# Patient Record
Sex: Female | Born: 1985 | Race: White | Hispanic: Yes | Marital: Single | Smoking: Never smoker
Health system: Southern US, Community
[De-identification: ages and names within clinical notes are randomized; demographics above are authoritative.]

## PROBLEM LIST (undated history)

## (undated) ENCOUNTER — Inpatient Hospital Stay (HOSPITAL_COMMUNITY): Payer: Self-pay

## (undated) DIAGNOSIS — O24419 Gestational diabetes mellitus in pregnancy, unspecified control: Secondary | ICD-10-CM

## (undated) DIAGNOSIS — K8020 Calculus of gallbladder without cholecystitis without obstruction: Secondary | ICD-10-CM

## (undated) DIAGNOSIS — Z8632 Personal history of gestational diabetes: Secondary | ICD-10-CM

## (undated) DIAGNOSIS — E119 Type 2 diabetes mellitus without complications: Secondary | ICD-10-CM

## (undated) HISTORY — PX: NO PAST SURGERIES: SHX2092

## (undated) HISTORY — DX: Personal history of gestational diabetes: Z86.32

---

## 2007-08-23 ENCOUNTER — Inpatient Hospital Stay (HOSPITAL_COMMUNITY): Admission: AD | Admit: 2007-08-23 | Discharge: 2007-08-24 | Payer: Self-pay | Admitting: Obstetrics & Gynecology

## 2007-08-23 ENCOUNTER — Inpatient Hospital Stay (HOSPITAL_COMMUNITY): Admission: AD | Admit: 2007-08-23 | Discharge: 2007-08-23 | Payer: Self-pay | Admitting: Obstetrics & Gynecology

## 2007-08-26 ENCOUNTER — Inpatient Hospital Stay (HOSPITAL_COMMUNITY): Admission: AD | Admit: 2007-08-26 | Discharge: 2007-08-26 | Payer: Self-pay | Admitting: Obstetrics and Gynecology

## 2008-10-13 ENCOUNTER — Ambulatory Visit (HOSPITAL_COMMUNITY): Admission: RE | Admit: 2008-10-13 | Discharge: 2008-10-13 | Payer: Self-pay | Admitting: Obstetrics & Gynecology

## 2008-10-27 ENCOUNTER — Inpatient Hospital Stay (HOSPITAL_COMMUNITY): Admission: AD | Admit: 2008-10-27 | Discharge: 2008-10-27 | Payer: Self-pay | Admitting: Obstetrics & Gynecology

## 2009-01-31 ENCOUNTER — Inpatient Hospital Stay (HOSPITAL_COMMUNITY): Admission: AD | Admit: 2009-01-31 | Discharge: 2009-02-03 | Payer: Self-pay | Admitting: Obstetrics & Gynecology

## 2009-01-31 ENCOUNTER — Ambulatory Visit: Payer: Self-pay | Admitting: Advanced Practice Midwife

## 2009-01-31 ENCOUNTER — Inpatient Hospital Stay (HOSPITAL_COMMUNITY): Admission: AD | Admit: 2009-01-31 | Discharge: 2009-01-31 | Payer: Self-pay | Admitting: Obstetrics & Gynecology

## 2010-01-21 ENCOUNTER — Inpatient Hospital Stay (HOSPITAL_COMMUNITY): Admission: AD | Admit: 2010-01-21 | Discharge: 2010-01-21 | Payer: Self-pay | Admitting: Obstetrics & Gynecology

## 2010-03-07 ENCOUNTER — Emergency Department (HOSPITAL_COMMUNITY): Admission: EM | Admit: 2010-03-07 | Discharge: 2010-03-08 | Payer: Self-pay | Admitting: Emergency Medicine

## 2010-12-02 LAB — URINALYSIS, ROUTINE W REFLEX MICROSCOPIC
Bilirubin Urine: NEGATIVE
Glucose, UA: NEGATIVE mg/dL
Hgb urine dipstick: NEGATIVE
Ketones, ur: NEGATIVE mg/dL
Nitrite: NEGATIVE
Protein, ur: NEGATIVE mg/dL

## 2010-12-02 LAB — DIFFERENTIAL
Basophils Relative: 1 % (ref 0–1)
Eosinophils Absolute: 0.2 10*3/uL (ref 0.0–0.7)
Eosinophils Relative: 2 % (ref 0–5)
Lymphocytes Relative: 40 % (ref 12–46)
Monocytes Absolute: 0.6 10*3/uL (ref 0.1–1.0)
Neutrophils Relative %: 49 % (ref 43–77)

## 2010-12-02 LAB — CBC
Hemoglobin: 12.1 g/dL (ref 12.0–15.0)
MCHC: 34.7 g/dL (ref 30.0–36.0)

## 2010-12-02 LAB — BASIC METABOLIC PANEL
BUN: 20 mg/dL (ref 6–23)
Calcium: 9.6 mg/dL (ref 8.4–10.5)
Chloride: 113 mEq/L — ABNORMAL HIGH (ref 96–112)
Glucose, Bld: 109 mg/dL — ABNORMAL HIGH (ref 70–99)
Potassium: 4.1 mEq/L (ref 3.5–5.1)
Sodium: 149 mEq/L — ABNORMAL HIGH (ref 135–145)

## 2010-12-02 LAB — GC/CHLAMYDIA PROBE AMP, GENITAL: GC Probe Amp, Genital: NEGATIVE

## 2010-12-02 LAB — WET PREP, GENITAL: Trich, Wet Prep: NONE SEEN

## 2010-12-04 LAB — POCT PREGNANCY, URINE: Preg Test, Ur: POSITIVE

## 2010-12-04 LAB — WET PREP, GENITAL
Trich, Wet Prep: NONE SEEN
Yeast Wet Prep HPF POC: NONE SEEN

## 2010-12-04 LAB — URINE CULTURE: Colony Count: >=100000

## 2010-12-04 LAB — CBC
HCT: 39.8 % (ref 36.0–46.0)
Hemoglobin: 14.1 g/dL (ref 12.0–15.0)
MCV: 91.8 fL (ref 78.0–100.0)
RBC: 4.34 MIL/uL (ref 3.87–5.11)

## 2010-12-04 LAB — URINALYSIS, ROUTINE W REFLEX MICROSCOPIC
Bilirubin Urine: NEGATIVE
Ketones, ur: NEGATIVE mg/dL
Protein, ur: NEGATIVE mg/dL
pH: 5.5 (ref 5.0–8.0)

## 2010-12-04 LAB — GC/CHLAMYDIA PROBE AMP, GENITAL
Chlamydia, DNA Probe: NEGATIVE
GC Probe Amp, Genital: NEGATIVE

## 2010-12-25 LAB — CBC
HCT: 39 % (ref 36.0–46.0)
Platelets: 366 10*3/uL (ref 150–400)

## 2010-12-25 LAB — RPR: RPR Ser Ql: NONREACTIVE

## 2011-01-01 LAB — DIFFERENTIAL
Basophils Absolute: 0.1 10*3/uL (ref 0.0–0.1)
Lymphocytes Relative: 23 % (ref 12–46)
Neutro Abs: 6.9 10*3/uL (ref 1.7–7.7)
Neutrophils Relative %: 71 % (ref 43–77)

## 2011-01-01 LAB — URINALYSIS, ROUTINE W REFLEX MICROSCOPIC
Bilirubin Urine: NEGATIVE
Glucose, UA: NEGATIVE mg/dL
Ketones, ur: NEGATIVE mg/dL
Nitrite: NEGATIVE
Specific Gravity, Urine: 1.025 (ref 1.005–1.030)
pH: 6.5 (ref 5.0–8.0)

## 2011-01-01 LAB — CBC
MCHC: 34.1 g/dL (ref 30.0–36.0)
Platelets: 336 10*3/uL (ref 150–400)
RBC: 3.89 MIL/uL (ref 3.87–5.11)

## 2011-01-27 ENCOUNTER — Inpatient Hospital Stay (HOSPITAL_COMMUNITY)
Admission: AD | Admit: 2011-01-27 | Discharge: 2011-01-27 | Disposition: A | Discharge status: Home / Self Care | Payer: Self-pay | Source: Ambulatory Visit | Attending: Obstetrics & Gynecology | Admitting: Obstetrics & Gynecology

## 2011-01-27 ENCOUNTER — Inpatient Hospital Stay (HOSPITAL_COMMUNITY): Payer: Self-pay

## 2011-01-27 DIAGNOSIS — O200 Threatened abortion: Secondary | ICD-10-CM

## 2011-01-27 DIAGNOSIS — R109 Unspecified abdominal pain: Secondary | ICD-10-CM | POA: Insufficient documentation

## 2011-01-27 LAB — CBC
MCV: 89.9 fL (ref 78.0–100.0)
Platelets: 358 10*3/uL (ref 150–400)
RBC: 4.25 MIL/uL (ref 3.87–5.11)
WBC: 8 10*3/uL (ref 4.0–10.5)

## 2011-01-27 LAB — URINALYSIS, ROUTINE W REFLEX MICROSCOPIC
Bilirubin Urine: NEGATIVE
Nitrite: NEGATIVE
Specific Gravity, Urine: 1.025 (ref 1.005–1.030)
Urobilinogen, UA: 0.2 mg/dL (ref 0.0–1.0)

## 2011-01-27 LAB — WET PREP, GENITAL: Trich, Wet Prep: NONE SEEN

## 2011-01-27 LAB — URINE MICROSCOPIC-ADD ON

## 2011-01-27 LAB — POCT PREGNANCY, URINE: Preg Test, Ur: POSITIVE

## 2011-01-28 LAB — URINE CULTURE: Culture  Setup Time: 201205131312

## 2011-01-29 ENCOUNTER — Inpatient Hospital Stay (HOSPITAL_COMMUNITY)
Admission: AD | Admit: 2011-01-29 | Discharge: 2011-01-29 | Disposition: A | Discharge status: Home / Self Care | Payer: Self-pay | Source: Ambulatory Visit | Attending: Obstetrics & Gynecology | Admitting: Obstetrics & Gynecology

## 2011-01-29 DIAGNOSIS — O200 Threatened abortion: Secondary | ICD-10-CM | POA: Insufficient documentation

## 2011-01-29 LAB — HEMOGLOBIN AND HEMATOCRIT, BLOOD
HCT: 42.1 % (ref 36.0–46.0)
Hemoglobin: 14.1 g/dL (ref 12.0–15.0)

## 2011-02-08 ENCOUNTER — Ambulatory Visit (INDEPENDENT_AMBULATORY_CARE_PROVIDER_SITE_OTHER): Payer: Self-pay | Admitting: Obstetrics and Gynecology

## 2011-02-08 DIAGNOSIS — O039 Complete or unspecified spontaneous abortion without complication: Secondary | ICD-10-CM

## 2011-02-09 NOTE — Group Therapy Note (Signed)
Shawna [name], Shawna [name]            ACCOUNT NO.:  [id number]  MEDICAL RECORD NO.:  [id number]           PATIENT TYPE:  A  LOCATION:  [facility]                   FACILITY:  [facility]  PHYSICIAN:  Catalina Antigua, MD     DATE OF BIRTH:  1986/05/26  DATE OF SERVICE:  02/08/2011                                 CLINIC NOTE  This is a 25 year old, G5, P 2-0-3-2 who presents today as a followup from the MAU.  The patient was in the MAU on Jan 29, 2011, secondary to a miscarriage.  The patient was medically managed with Cytotec and was told to follow up in the GYN Clinic.  Her quantitative beta HCG at that time was found to be 6476.  The patient presents today and denies any cramping pain.  She denies any vaginal bleeding and is physically doing well.  The patient is not interested in trying to conceive again, is planning on using Implanon for birth control.  [facility]on [facility].  In the meantime, the patient will have a quantitative beta HCG drawn today and subsequent visits until it is less than 2.  The patient verbalized understanding.  All questions were answered.  The patient was instructed to abstain from intercourse until the resolution of the pregnancy as to prevent a new pregnancy to develop which would confuse the quantitative beta HCG values.  The patient verbalized understanding.          ______________________________ Catalina Antigua, MD    PC/MEDQ  D:  02/08/2011  T:  02/09/2011  Job:  [id number]

## 2011-06-24 LAB — HCG, QUANTITATIVE, PREGNANCY
hCG, Beta Chain, Quant, S: 1236 — ABNORMAL HIGH
hCG, Beta Chain, Quant, S: 66 — ABNORMAL HIGH

## 2011-06-24 LAB — URINALYSIS, ROUTINE W REFLEX MICROSCOPIC
Bilirubin Urine: NEGATIVE
Ketones, ur: NEGATIVE
Protein, ur: NEGATIVE
Urobilinogen, UA: 0.2

## 2011-06-24 LAB — URINE MICROSCOPIC-ADD ON

## 2011-06-24 LAB — GC/CHLAMYDIA PROBE AMP, GENITAL: GC Probe Amp, Genital: NEGATIVE

## 2011-06-24 LAB — POCT PREGNANCY, URINE
Operator id: 117411
Preg Test, Ur: POSITIVE

## 2011-06-24 LAB — WET PREP, GENITAL
Clue Cells Wet Prep HPF POC: NONE SEEN
Trich, Wet Prep: NONE SEEN
Yeast Wet Prep HPF POC: NONE SEEN

## 2011-06-24 LAB — ABO/RH: ABO/RH(D): O POS

## 2011-06-24 LAB — CBC
MCHC: 35.4
MCV: 88.9
RDW: 13.7

## 2013-11-20 ENCOUNTER — Emergency Department (HOSPITAL_COMMUNITY)
Admission: EM | Admit: 2013-11-20 | Discharge: 2013-11-20 | Disposition: A | Discharge status: Home / Self Care | Payer: Self-pay | Attending: Emergency Medicine | Admitting: Emergency Medicine

## 2013-11-20 ENCOUNTER — Encounter (HOSPITAL_COMMUNITY): Payer: Self-pay | Admitting: Emergency Medicine

## 2013-11-20 ENCOUNTER — Emergency Department (HOSPITAL_COMMUNITY): Payer: Self-pay

## 2013-11-20 DIAGNOSIS — K8050 Calculus of bile duct without cholangitis or cholecystitis without obstruction: Secondary | ICD-10-CM

## 2013-11-20 DIAGNOSIS — Z3202 Encounter for pregnancy test, result negative: Secondary | ICD-10-CM | POA: Insufficient documentation

## 2013-11-20 DIAGNOSIS — K8020 Calculus of gallbladder without cholecystitis without obstruction: Secondary | ICD-10-CM | POA: Insufficient documentation

## 2013-11-20 DIAGNOSIS — R109 Unspecified abdominal pain: Secondary | ICD-10-CM

## 2013-11-20 LAB — URINALYSIS, ROUTINE W REFLEX MICROSCOPIC
Bilirubin Urine: NEGATIVE
Glucose, UA: NEGATIVE mg/dL
HGB URINE DIPSTICK: NEGATIVE
KETONES UR: NEGATIVE mg/dL
Nitrite: NEGATIVE
Protein, ur: NEGATIVE mg/dL
SPECIFIC GRAVITY, URINE: 1.024 (ref 1.005–1.030)
Urobilinogen, UA: 0.2 mg/dL (ref 0.0–1.0)
pH: 5.5 (ref 5.0–8.0)

## 2013-11-20 LAB — COMPREHENSIVE METABOLIC PANEL
ALK PHOS: 120 U/L — AB (ref 39–117)
ALT: 108 U/L — AB (ref 0–35)
AST: 105 U/L — ABNORMAL HIGH (ref 0–37)
Albumin: 3.9 g/dL (ref 3.5–5.2)
BUN: 14 mg/dL (ref 6–23)
CO2: 18 meq/L — AB (ref 19–32)
Calcium: 9 mg/dL (ref 8.4–10.5)
Chloride: 102 mEq/L (ref 96–112)
Creatinine, Ser: 0.56 mg/dL (ref 0.50–1.10)
GFR calc non Af Amer: >90 mL/min (ref >90)
Glucose, Bld: 114 mg/dL — ABNORMAL HIGH (ref 70–99)
POTASSIUM: 4.8 meq/L (ref 3.7–5.3)
Sodium: 136 mEq/L — ABNORMAL LOW (ref 137–147)
TOTAL PROTEIN: 8.1 g/dL (ref 6.0–8.3)

## 2013-11-20 LAB — CBC WITH DIFFERENTIAL/PLATELET
Basophils Absolute: 0 10*3/uL (ref 0.0–0.1)
Basophils Relative: 0 % (ref 0–1)
Eosinophils Absolute: 0.1 10*3/uL (ref 0.0–0.7)
Eosinophils Relative: 1 % (ref 0–5)
HCT: 44.4 % (ref 36.0–46.0)
HEMOGLOBIN: 15.8 g/dL — AB (ref 12.0–15.0)
LYMPHS ABS: 2.2 10*3/uL (ref 0.7–4.0)
LYMPHS PCT: 39 % (ref 12–46)
MCH: 32 pg (ref 26.0–34.0)
MCHC: 35.6 g/dL (ref 30.0–36.0)
MCV: 89.9 fL (ref 78.0–100.0)
MONOS PCT: 11 % (ref 3–12)
Monocytes Absolute: 0.6 10*3/uL (ref 0.1–1.0)
NEUTROS ABS: 2.7 10*3/uL (ref 1.7–7.7)
Neutrophils Relative %: 49 % (ref 43–77)
PLATELETS: 294 10*3/uL (ref 150–400)
RBC: 4.94 MIL/uL (ref 3.87–5.11)
RDW: 13 % (ref 11.5–15.5)
WBC: 5.5 10*3/uL (ref 4.0–10.5)

## 2013-11-20 LAB — URINE MICROSCOPIC-ADD ON

## 2013-11-20 LAB — LIPASE, BLOOD: Lipase: 20 U/L (ref 11–59)

## 2013-11-20 LAB — PREGNANCY, URINE: Preg Test, Ur: NEGATIVE

## 2013-11-20 MED ORDER — ACETAMINOPHEN 325 MG PO TABS
650.0000 mg | ORAL_TABLET | Freq: Once | ORAL | Status: AC
  Administered 2013-11-20: 650 mg via ORAL
  Filled 2013-11-20: qty 2

## 2013-11-20 MED ORDER — GADOBENATE DIMEGLUMINE 529 MG/ML IV SOLN
20.0000 mL | Freq: Once | INTRAVENOUS | Status: AC | PRN
  Administered 2013-11-20: 20 mL via INTRAVENOUS

## 2013-11-20 MED ORDER — ONDANSETRON HCL 4 MG/2ML IJ SOLN
4.0000 mg | Freq: Once | INTRAMUSCULAR | Status: AC
  Administered 2013-11-20: 4 mg via INTRAVENOUS
  Filled 2013-11-20: qty 2

## 2013-11-20 MED ORDER — MORPHINE SULFATE 4 MG/ML IJ SOLN
4.0000 mg | Freq: Once | INTRAMUSCULAR | Status: AC
  Administered 2013-11-20: 4 mg via INTRAVENOUS
  Filled 2013-11-20: qty 1

## 2013-11-20 MED ORDER — SODIUM CHLORIDE 0.9 % IV BOLUS (SEPSIS)
1000.0000 mL | Freq: Once | INTRAVENOUS | Status: AC
  Administered 2013-11-20: 1000 mL via INTRAVENOUS

## 2013-11-20 MED ORDER — ONDANSETRON HCL 4 MG PO TABS: 4.0000 mg | ORAL_TABLET | Freq: Four times a day (QID) | ORAL | Status: DC

## 2013-11-20 MED ORDER — HYDROCODONE-ACETAMINOPHEN 5-325 MG PO TABS: 1.0000 | ORAL_TABLET | ORAL | Status: DC | PRN

## 2013-11-20 NOTE — ED Provider Notes (Signed)
CSN: [id number]     Arrival date & time 11/20/13  7829 History   First MD Initiated Contact with Patient 11/20/13 [phone]     Chief Complaint  Patient presents with  . Abdominal Pain  . Emesis  . Diarrhea     (Consider location/radiation/quality/duration/timing/severity/associated sxs/prior Treatment) Patient is a 28 y.o. female presenting with abdominal pain, vomiting, and diarrhea. The history is provided by the patient. No language interpreter was used.  Abdominal Pain Pain location:  Periumbilical and epigastric Pain quality: aching   Pain radiates to:  Does not radiate Associated symptoms: diarrhea, nausea and vomiting   Associated symptoms: no chest pain, no chills, no cough, no dysuria, no fever and no shortness of breath   Associated symptoms comment:  Nausea, vomiting and diarrhea for the past 3 days without fever. No sick contacts. No blood in emesis or stools. She complains of pain in abdomen limited to epigastrium and periumbilical region.  Emesis Associated symptoms: abdominal pain and diarrhea   Associated symptoms: no chills   Diarrhea Associated symptoms: abdominal pain and vomiting   Associated symptoms: no chills and no fever     No past medical history on file. No past surgical history on file. No family history on file. History  Substance Use Topics  . Smoking status: Not on file  . Smokeless tobacco: Not on file  . Alcohol Use: Not on file   OB History   No data available     Review of Systems  Constitutional: Negative for fever and chills.  Respiratory: Negative.  Negative for cough and shortness of breath.   Cardiovascular: Negative.  Negative for chest pain.  Gastrointestinal: Positive for nausea, vomiting, abdominal pain and diarrhea. Negative for blood in stool.  Genitourinary: Negative.  Negative for dysuria.  Musculoskeletal: Negative.   Skin: Negative.   Neurological: Negative.       Allergies  Review of patient's allergies indicates no  known allergies.  Home Medications   Current Outpatient Rx  Name  Route  Sig  Dispense  Refill  . loperamide (IMODIUM) 2 MG capsule   Oral   Take 4 mg by mouth as needed for diarrhea or loose stools.          BP 119/78  Pulse 86  Temp(Src) 98.3 F (36.8 C) (Oral)  Resp 20  SpO2 100% Physical Exam  Constitutional: She is oriented to person, place, and time. She appears well-developed and well-nourished.  Appears uncomfortable but non-toxic.  HENT:  Head: Normocephalic.  Mouth/Throat: Oropharynx is clear and moist.  Eyes: Conjunctivae are normal.  Neck: Normal range of motion. Neck supple.  Cardiovascular: Normal rate and regular rhythm.   Pulmonary/Chest: Effort normal and breath sounds normal.  Abdominal: Soft. Bowel sounds are normal. There is no tenderness. There is no rebound and no guarding.  Musculoskeletal: Normal range of motion.  Neurological: She is alert and oriented to person, place, and time.  Skin: Skin is warm and dry. No rash noted.  Psychiatric: She has a normal mood and affect.    ED Course  Procedures (including critical care time) Labs Review Labs Reviewed  CBC WITH DIFFERENTIAL  COMPREHENSIVE METABOLIC PANEL  LIPASE, BLOOD  URINALYSIS, ROUTINE W REFLEX MICROSCOPIC  PREGNANCY, URINE   Results for orders placed during the hospital encounter of 11/20/13  CBC WITH DIFFERENTIAL      Result Value Ref Range   WBC 5.5  4.0 - 10.5 K/uL   RBC 4.94  3.87 - 5.11 MIL/uL  Hemoglobin 15.8 (*) 12.0 - 15.0 g/dL   HCT 30.844.4  65.736.0 - 84.646.0 %   MCV 89.9  78.0 - 100.0 fL   MCH 32.0  26.0 - 34.0 pg   MCHC 35.6  30.0 - 36.0 g/dL   RDW 96.213.0  95.211.5 - 84.115.5 %   Platelets 294  150 - 400 K/uL   Neutrophils Relative % 49  43 - 77 %   Neutro Abs 2.7  1.7 - 7.7 K/uL   Lymphocytes Relative 39  12 - 46 %   Lymphs Abs 2.2  0.7 - 4.0 K/uL   Monocytes Relative 11  3 - 12 %   Monocytes Absolute 0.6  0.1 - 1.0 K/uL   Eosinophils Relative 1  0 - 5 %   Eosinophils Absolute  0.1  0.0 - 0.7 K/uL   Basophils Relative 0  0 - 1 %   Basophils Absolute 0.0  0.0 - 0.1 K/uL  COMPREHENSIVE METABOLIC PANEL      Result Value Ref Range   Sodium 136 (*) 137 - 147 mEq/L   Potassium 4.8  3.7 - 5.3 mEq/L   Chloride 102  96 - 112 mEq/L   CO2 18 (*) 19 - 32 mEq/L   Glucose, Bld 114 (*) 70 - 99 mg/dL   BUN 14  6 - 23 mg/dL   Creatinine, Ser 3.240.56  0.50 - 1.10 mg/dL   Calcium 9.0  8.4 - 40.110.5 mg/dL   Total Protein 8.1  6.0 - 8.3 g/dL   Albumin 3.9  3.5 - 5.2 g/dL   AST 027105 (*) 0 - 37 U/L   ALT 108 (*) 0 - 35 U/L   Alkaline Phosphatase 120 (*) 39 - 117 U/L   Total Bilirubin <0.2 (*) 0.3 - 1.2 mg/dL   GFR calc non Af Amer >90  >90 mL/min   GFR calc Af Amer >90  >90 mL/min  LIPASE, BLOOD      Result Value Ref Range   Lipase 20  11 - 59 U/L  URINALYSIS, ROUTINE W REFLEX MICROSCOPIC      Result Value Ref Range   Color, Urine YELLOW  YELLOW   APPearance CLOUDY (*) CLEAR   Specific Gravity, Urine 1.024  1.005 - 1.030   pH 5.5  5.0 - 8.0   Glucose, UA NEGATIVE  NEGATIVE mg/dL   Hgb urine dipstick NEGATIVE  NEGATIVE   Bilirubin Urine NEGATIVE  NEGATIVE   Ketones, ur NEGATIVE  NEGATIVE mg/dL   Protein, ur NEGATIVE  NEGATIVE mg/dL   Urobilinogen, UA 0.2  0.0 - 1.0 mg/dL   Nitrite NEGATIVE  NEGATIVE   Leukocytes, UA SMALL (*) NEGATIVE  PREGNANCY, URINE      Result Value Ref Range   Preg Test, Ur NEGATIVE  NEGATIVE  URINE MICROSCOPIC-ADD ON      Result Value Ref Range   Squamous Epithelial / LPF MANY (*) RARE   WBC, UA 3-6  <3 WBC/hpf   RBC / HPF 0-2  <3 RBC/hpf   Bacteria, UA FEW (*) RARE   [place] Abdomen Complete  11/20/2013   CLINICAL DATA:  Abdominal pain, vomiting.  EXAM: ULTRASOUND ABDOMEN COMPLETE  COMPARISON:  None.  FINDINGS: Gallbladder:  Sludge and multiple small layering stones within the gallbladder, the largest 4 mm. No wall thickening. Negative sonographic Murphy's.  Common bile duct:  Diameter: Mildly prominent at 7 mm, upper limits normal. The distal common  bile duct cannot be visualized due to overlying bowel gas.  Liver:  Dense, coarsened echotexture throughout the liver suggesting fatty infiltration. Hypoechoic area within the left lobe likely represents focal fatty sparing. No biliary ductal dilatation or focal lesion.  IVC:  No abnormality visualized.  Pancreas:  Not visualized due to overlying bowel gas.  Spleen:  Size and appearance within normal limits.  Right Kidney:  Length: 10.4 cm. Echogenicity within normal limits. No mass or hydronephrosis visualized.  Left Kidney:  Length: 11.1 cm. Echogenicity within normal limits. No mass or hydronephrosis visualized.  Abdominal aorta:  No aneurysm visualized.  Other findings:  None.  IMPRESSION: Numerous small gallstones within the gallbladder along with gallbladder sludge. No sonographic evidence of acute cholecystitis.  Proximal common bile duct is mildly prominent at 7 mm. While this could be upper limits of normal, recommend correlation with symptoms and LFTs. If there is concern for a distal CBD stone, consider further evaluation with MRCP or ERCP.   Electronically Signed   By: Charlett Nose M.D.   On: 11/20/2013 12:54   Imaging Review No results found.   EKG Interpretation None      MDM   Final diagnoses:  None      Pain is controlled with Morphine x 1, and nausea continues to be resolved after Zofran. With LFT's slightly elevated after presentation with abdominal pain, N, V, an ultrasound was ordered that showed multiple gall stones. CBD upper limits of normal at 7 mm, query CBD stone given abnormal liver functions. Discussed with Dr. Marina Goodell of GI who recommended an MRCP to determine whether she should be admitted or if she was stable for discharge and outpatient surgical follow up.   Medical coarse, labs and frequent rechecks performed with the interpreter via phone service. She remains comfortable. Waiting on MRCP to be done to determine disposition.   Patient care transferred to Dr. Warnell Forester pending MRCP results.  Arnoldo Hooker, PA-C 11/20/13 1512

## 2013-11-20 NOTE — ED Notes (Addendum)
Pt presents to department for evaluation of upper abdominal pain and N/V/D. Ongoing x1 day. 5/10 pain at the time. Pt states several episodes of vomiting and diarrhea at home yesterday. Pt is alert and oriented x4. Pt does not speak english, translator phone used.

## 2013-11-20 NOTE — Discharge Instructions (Signed)
Dolor abdominal en adultos  (Abdominal Pain, Adult)  El dolor puede tener muchas causas. Normalmente la causa del dolor abdominal no es una enfermedad y mejorará sin tratamiento. Frecuentemente puede controlarse y tratarse en casa. Su médico le realizará un examen físico y posiblemente solicite análisis de sangre y radiografías para ayudar a determinar la gravedad de su dolor. Sin embargo, en muchos casos, debe transcurrir más tiempo antes de que se pueda encontrar una causa evidente del dolor. Antes de llegar a ese punto, es posible que su médico no sepa si necesita más pruebas o un tratamiento más profundo.  INSTRUCCIONES PARA EL CUIDADO EN EL HOGAR   Esté atento al dolor para ver si hay cambios. Las siguientes indicaciones ayudarán a aliviar cualquier molestia que pueda sentir:  · Tome solo medicamentos de venta libre o recetados, según las indicaciones del médico.  · No tome laxantes a menos que se lo haya indicado su médico.  · Pruebe con una dieta líquida absoluta (caldo, té o agua) según se lo indique su médico. Introduzca gradualmente una dieta normal, según su tolerancia.  SOLICITE ATENCIÓN MÉDICA SI:  · Tiene dolor abdominal sin explicación.  · Tiene dolor abdominal relacionado con náuseas o diarrea.  · Tiene dolor cuando orina o defeca.  · Experimenta dolor abdominal que lo despierta de noche.  · Tiene dolor abdominal que empeora o mejora cuando come alimentos.  · Tiene dolor abdominal que empeora cuando come alimentos grasosos.  SOLICITE ATENCIÓN MÉDICA DE INMEDIATO SI:   · El dolor no desaparece en un plazo máximo de 2 horas.  · Tiene fiebre.  · No deja de (vomitar).  · El dolor se siente solo en partes del abdomen, como el lado derecho o la parte inferior izquierda del abdomen.  · Evacúa materia fecal sanguinolenta o negra, de aspecto alquitranado.  ASEGÚRESE DE QUE:  · Comprende estas instrucciones.  · Controlará su afección.  · Recibirá ayuda de inmediato si no mejora o si empeora.  Document  Released: 09/02/2005 Document Revised: 06/23/2013  ExitCare® Patient Information ©2014 [employer].

## 2013-11-20 NOTE — ED Notes (Signed)
Spoke with [facility] [phone] to discharge patient.  All questions answered.

## 2013-11-20 NOTE — ED Notes (Addendum)
Confirmed pt has not had anything to eat or drink since noon. MR stated they will come pick pt up within an hour. Everyone is aware.

## 2013-11-20 NOTE — ED Notes (Signed)
Patient transported to Ultrasound 

## 2013-11-20 NOTE — ED Provider Notes (Signed)
Medical screening examination/treatment/procedure(s) were performed by non-physician practitioner and as supervising physician I was immediately available for consultation/collaboration.   EKG Interpretation None        Junius ArgyleForrest S Nakiesha Rumsey, MD 11/20/13 1557

## 2013-12-04 ENCOUNTER — Ambulatory Visit: Payer: Self-pay | Attending: Internal Medicine | Admitting: Internal Medicine

## 2013-12-04 VITALS — BP 114/76 | HR 85 | Temp 97.7°F | Ht 63.0 in | Wt 223.8 lb

## 2013-12-04 DIAGNOSIS — R300 Dysuria: Secondary | ICD-10-CM

## 2013-12-04 DIAGNOSIS — K8020 Calculus of gallbladder without cholecystitis without obstruction: Secondary | ICD-10-CM | POA: Insufficient documentation

## 2013-12-04 DIAGNOSIS — R1011 Right upper quadrant pain: Secondary | ICD-10-CM | POA: Insufficient documentation

## 2013-12-04 LAB — CBC WITH DIFFERENTIAL/PLATELET
BASOS ABS: 0 10*3/uL (ref 0.0–0.1)
Basophils Relative: 0 % (ref 0–1)
Eosinophils Absolute: 0.2 10*3/uL (ref 0.0–0.7)
Eosinophils Relative: 2 % (ref 0–5)
HCT: 43 % (ref 36.0–46.0)
Hemoglobin: 15 g/dL (ref 12.0–15.0)
Lymphocytes Relative: 33 % (ref 12–46)
Lymphs Abs: 2.5 10*3/uL (ref 0.7–4.0)
MCH: 30.7 pg (ref 26.0–34.0)
MCHC: 34.9 g/dL (ref 30.0–36.0)
MCV: 87.9 fL (ref 78.0–100.0)
Monocytes Absolute: 0.5 10*3/uL (ref 0.1–1.0)
Monocytes Relative: 6 % (ref 3–12)
NEUTROS ABS: 4.5 10*3/uL (ref 1.7–7.7)
NEUTROS PCT: 59 % (ref 43–77)
Platelets: 418 10*3/uL — ABNORMAL HIGH (ref 150–400)
RBC: 4.89 MIL/uL (ref 3.87–5.11)
RDW: 13.9 % (ref 11.5–15.5)
WBC: 7.6 10*3/uL (ref 4.0–10.5)

## 2013-12-04 LAB — COMPLETE METABOLIC PANEL WITH GFR
ALBUMIN: 4.4 g/dL (ref 3.5–5.2)
ALK PHOS: 106 U/L (ref 39–117)
ALT: 47 U/L — AB (ref 0–35)
AST: 30 U/L (ref 0–37)
BUN: 13 mg/dL (ref 6–23)
CO2: 26 mEq/L (ref 19–32)
Calcium: 9.7 mg/dL (ref 8.4–10.5)
Chloride: 101 mEq/L (ref 96–112)
Creat: 0.55 mg/dL (ref 0.50–1.10)
GFR, Est African American: >89 mL/min
GFR, Est Non African American: >89 mL/min
Glucose, Bld: 146 mg/dL — ABNORMAL HIGH (ref 70–99)
POTASSIUM: 4.1 meq/L (ref 3.5–5.3)
SODIUM: 138 meq/L (ref 135–145)
TOTAL PROTEIN: 7.6 g/dL (ref 6.0–8.3)
Total Bilirubin: 0.4 mg/dL (ref 0.2–1.2)

## 2013-12-04 LAB — POCT URINALYSIS DIPSTICK
Bilirubin, UA: NEGATIVE
Glucose, UA: NEGATIVE
Ketones, UA: NEGATIVE
Nitrite, UA: NEGATIVE
PH UA: 6
PROTEIN UA: NEGATIVE
Spec Grav, UA: 1.02
Urobilinogen, UA: 0.2

## 2013-12-04 LAB — LIPID PANEL
CHOL/HDL RATIO: 5 ratio
Cholesterol: 164 mg/dL (ref 0–200)
HDL: 33 mg/dL — AB (ref >39)
LDL Cholesterol: 58 mg/dL (ref 0–99)
Triglycerides: 364 mg/dL — ABNORMAL HIGH (ref <150)
VLDL: 73 mg/dL — ABNORMAL HIGH (ref 0–40)

## 2013-12-04 LAB — TSH: TSH: 0.89 u[IU]/mL (ref 0.350–4.500)

## 2013-12-04 LAB — LIPASE: LIPASE: 13 U/L (ref 0–75)

## 2013-12-04 MED ORDER — ONDANSETRON HCL 4 MG PO TABS: 4.0000 mg | ORAL_TABLET | Freq: Three times a day (TID) | ORAL | Status: DC | PRN

## 2013-12-04 MED ORDER — TRAMADOL HCL 50 MG PO TABS: 50.0000 mg | ORAL_TABLET | Freq: Three times a day (TID) | ORAL | Status: DC | PRN

## 2013-12-04 NOTE — Progress Notes (Signed)
Abdomen pain recurrent since ED visit.

## 2013-12-04 NOTE — Progress Notes (Signed)
Patient ID: Shawna [name], female   DOB: [birth year], 28 y.o.   MRN: [id number]   CC:  HPI: 28 year old female here for evaluation of right upper quadrant pain. She was in the ER and 11/20/13. Found to have elevated AST and ALT, T. bilirubin was less than 0.2. Urinalysis was negative. Ultrasound of the abdomen showed.Numerous small gallstones within the gallbladder along with gallbladder sludge. No sonographic evidence of acute cholecystitis. Proximal common bile duct is mildly prominent at 7 mm Patient also had an MRCP, that showed Cholelithiasis without evidence to suggest acute cholecystitis  Patient still complaining of right upper quadrant pain after receiving Percocet and Zofran from the ER No fever  Family history reviewed and negative   No Known Allergies No past medical history on file. No current outpatient prescriptions on file prior to visit.   No current facility-administered medications on file prior to visit.   No family history on file. History   Social History  . Marital Status: Single    Spouse Name: N/A    Number of Children: N/A  . Years of Education: N/A   Occupational History  . Not on file.   Social History Main Topics  . Smoking status: Never Smoker   . Smokeless tobacco: Not on file  . Alcohol Use: No  . Drug Use: No  . Sexual Activity: Not on file   Other Topics Concern  . Not on file   Social History Narrative  . No narrative on file    Review of Systems  Constitutional: Negative for fever, chills, diaphoresis, activity change, appetite change and fatigue.  HENT: Negative for ear pain, nosebleeds, congestion, facial swelling, rhinorrhea, neck pain, neck stiffness and ear discharge.   Eyes: Negative for pain, discharge, redness, itching and visual disturbance.  Respiratory: Negative for cough, choking, chest tightness, shortness of breath, wheezing and stridor.   Cardiovascular: Negative for chest pain, palpitations and leg swelling.   Gastrointestinal: As in history of present illness  Genitourinary: Negative for dysuria, urgency, frequency, hematuria, flank pain, decreased urine volume, difficulty urinating and dyspareunia.  Musculoskeletal: Negative for back pain, joint swelling, arthralgias and gait problem.  Neurological: Negative for dizziness, tremors, seizures, syncope, facial asymmetry, speech difficulty, weakness, light-headedness, numbness and headaches.  Hematological: Negative for adenopathy. Does not bruise/bleed easily.  Psychiatric/Behavioral: Negative for hallucinations, behavioral problems, confusion, dysphoric mood, decreased concentration and agitation.    Objective:   Filed Vitals:   12/04/13 1034  BP: 114/76  Pulse: 85  Temp: 97.7 F (36.5 C)    Physical Exam  Constitutional: Appears well-developed and well-nourished. No distress.  HENT: Normocephalic. External right and left ear normal. Oropharynx is clear and moist.  Eyes: Conjunctivae and EOM are normal. PERRLA, no scleral icterus.  Neck: Normal ROM. Neck supple. No JVD. No tracheal deviation. No thyromegaly.  CVS: RRR, S1/S2 +, no murmurs, no gallops, no carotid bruit.  Pulmonary: Effort and breath sounds normal, no stridor, rhonchi, wheezes, rales.  Abdominal: Soft. BS +,  no distension, right upper quadrant tenderness, rebound or guarding.  Musculoskeletal: Normal range of motion. No edema and no tenderness.  Lymphadenopathy: No lymphadenopathy noted, cervical, inguinal. Neuro: Alert. Normal reflexes, muscle tone coordination. No cranial nerve deficit. Skin: Skin is warm and dry. No rash noted. Not diaphoretic. No erythema. No pallor.  Psychiatric: Normal mood and affect. Behavior, judgment, thought content normal.   Lab Results  Component Value Date   WBC 5.5 11/20/2013   HGB 15.8* 11/20/2013   HCT 44.4  11/20/2013   MCV 89.9 11/20/2013   PLT 294 11/20/2013   Lab Results  Component Value Date   CREATININE 0.56 11/20/2013   BUN 14  11/20/2013   NA 136* 11/20/2013   K 4.8 11/20/2013   CL 102 11/20/2013   CO2 18* 11/20/2013    No results found for this basename: HGBA1C   Lipid Panel  No results found for this basename: chol, trig, hdl, cholhdl, vldl, ldlcalc       Assessment and plan:   There are no active problems to display for this patient.  Right upper quadrant pain Secondary to biliary colic ED,Discussed with Dr. Marina GoodellPerry of GI on 11/20/13 who recommended an MRCP to determine whether she should be admitted or if she was stable for discharge and outpatient surgical follow up.  Will provide more tramadol for pain as well as Zofran for nausea  gastroenterology referral Repeat CMP and lipase   Establish care Baseline labs This was a 15 minute appointment between 10:15 and 10:30 Patient needs to reschedule for a full established care visit for 30 minutes as she requires an interpreter    The patient was given clear instructions to go to ER or return to medical center if symptoms don't improve, worsen or new problems develop. The patient verbalized understanding. The patient was told to call to get any lab results if not heard anything in the next week.

## 2013-12-08 MED ORDER — GEMFIBROZIL 600 MG PO TABS: 600.0000 mg | ORAL_TABLET | Freq: Every morning | ORAL | Status: DC

## 2013-12-08 NOTE — Addendum Note (Signed)
Addended by: Susie CassetteABROL MD, Germain OsgoodNAYANA on: 12/08/2013 11:15 AM   Modules accepted: Orders

## 2013-12-14 ENCOUNTER — Telehealth: Payer: Self-pay | Admitting: Emergency Medicine

## 2013-12-14 NOTE — Telephone Encounter (Signed)
Pt given lab results with new medication instructions via pacific interpretor Pt instructed to follow strict low fat diet with exercising Pt verbalized understanding

## 2013-12-14 NOTE — Telephone Encounter (Signed)
Message copied by Darlis LoanSMITH, Jameelah Watts D on Tue Dec 14, 2013  2:09 PM ------      Message from: Susie CassetteABROL MD, Germain OsgoodNAYANA      Created: Wed Dec 08, 2013 11:15 AM       Notify patient of the triglycerides are elevated at 364. I have called in a prescription for [facility] ------

## 2014-01-03 ENCOUNTER — Ambulatory Visit: Payer: Self-pay

## 2014-01-03 ENCOUNTER — Ambulatory Visit: Payer: Self-pay | Attending: Internal Medicine | Admitting: Internal Medicine

## 2014-01-03 ENCOUNTER — Encounter: Payer: Self-pay | Admitting: Internal Medicine

## 2014-01-03 VITALS — BP 120/84 | HR 75 | Temp 98.2°F | Resp 16 | Ht 62.5 in | Wt 222.0 lb

## 2014-01-03 DIAGNOSIS — K8020 Calculus of gallbladder without cholecystitis without obstruction: Secondary | ICD-10-CM | POA: Insufficient documentation

## 2014-01-03 DIAGNOSIS — R739 Hyperglycemia, unspecified: Secondary | ICD-10-CM | POA: Insufficient documentation

## 2014-01-03 DIAGNOSIS — Z09 Encounter for follow-up examination after completed treatment for conditions other than malignant neoplasm: Secondary | ICD-10-CM | POA: Insufficient documentation

## 2014-01-03 DIAGNOSIS — R7309 Other abnormal glucose: Secondary | ICD-10-CM | POA: Insufficient documentation

## 2014-01-03 LAB — POCT GLYCOSYLATED HEMOGLOBIN (HGB A1C): Hemoglobin A1C: 5.9

## 2014-01-03 NOTE — Patient Instructions (Signed)
Colelitiasis (Cholelithiasis) La colelitiasis (tambin llamada clculos en la vescula) es una enfermedad en la que se forman piedras en la vescula. La vescula es un rgano que almacena la bilis que se forma en el hgado y que ayuda a digerir grasas. Los clculos comienzan como pequeos cristales y lentamente se transforman en piedras. El dolor en la vescula ocurre cuando se producen espasmos y los clculos obstruyen el conducto. El dolor tambin se produce cuando una piedra sale por el conducto.  FACTORES DE RIESGO  Ser mujer.   Tener embarazos mltiples. Algunas veces los mdicos aconsejan extirpar los clculos biliares antes de futuros embarazos.   Ser obeso.  Dietas que incluyan comidas fritas y grasas.   Ser mayor de 60 aos y el aumento de la edad.   El uso prolongado de medicamentos que contengan hormonas femeninas.   Tener diabetes mellitus.   Prdida rpida de peso.   Historia familiar de clculos (herencia).  SNTOMAS  Nuseas.   Vmitos.  Dolor abdominal.   Piel amarilla (ictericia)   Dolor sbito. Puede persistir desde algunos minutos hasta algunas horas.  Fiebre.   Sensibilidad al tacto. En algunos casos, cuando los clculos biliares no se mueven hacia el conducto biliar, las personas no sienten dolor ni presentan sntomas. Estos se denominan clculos "silenciosos".  TRATAMIENTO Los clculos silenciosos no requieren tratamiento. En los casos graves, podr ser necesaria una ciruga de urgencia. Las opciones de tratamiento son:  Ciruga para extirpar la vescula. Es el tratamiento ms frecuente.  Medicamentos. No siempre dan resultado y pueden demorar entre 6 y 12 meses o ms en hacer efecto.  Tratamiento con ondas de choque (litotricia biliar extracorporal). En este tratamiento, una mquina de ultrasonido enva ondas de choque a la vescula para destruir los clculos en pequeos fragmentos que luego podrn pasar a los intestinos o ser disueltas  con medicamentos. INSTRUCCIONES PARA EL CUIDADO EN EL HOGAR   Slo tome medicamentos de venta libre o recetados para calmar el dolor, el malestar o bajar la fiebre, segn las indicaciones de su mdico.   Siga una dieta baja en grasas hasta que su mdico lo vea nuevamente. Las grasas hacen que la vescula se contraiga, lo que puede producir dolor.   Concurra a las consultas de control con su mdico segn las indicaciones. Los ataques casi siempre son recurrentes y generalmente habr que someterse a una ciruga como tratamiento permanente.  SOLICITE ATENCIN MDICA DE INMEDIATO SI:   El dolor aumenta y no puede controlarlo con los medicamentos.   Tiene fiebre o sntomas persistentes durante ms de 2 - 3 das.   Tiene fiebre y los sntomas empeoran repentinamente.   Tiene nuseas o vmitos persistentes.  ASEGRESE DE QUE:   Comprende estas instrucciones.  Controlar su afeccin.  Recibir ayuda de inmediato si no mejora o si empeora. Document Released: 06/19/2006 Document Revised: 05/05/2013 Exit[employer] Patient Information 2014 [employer].  

## 2014-01-03 NOTE — Progress Notes (Signed)
Patient ID: Shawna [name], female   DOB: 21-Jul-1986, 28 y.o.   MRN: [id number]   Shawna [name], is a 28 y.o. female  [id number]  [id number]  DOB - 21-Jul-1986  Chief Complaint  Patient presents with  . Follow-up        Subjective:   Shawna [name] is a 28 y.o. female here today for a follow up visit. Patient was recently seen in the ER for right upper quadrant abdomen pain, Ultrasound of the abdomen showed numerous small gallstones within the gallbladder along with gallbladder sludge. No sonographic evidence of acute cholecystitis. Proximal common bile duct is mildly prominent at 7 mm. Patient also had an MRCP, that showed Cholelithiasis without evidence to suggest acute cholecystitis. She is on medication for hypertriglyceridemia and nausea from her gallstone. She is here today with no new complaints, she is seeking solutions to the gallstone. She reports occasional exacerbation of the pain at the right upper quadrant of the abdomen, no jaundice, no neck swelling. Her previously elevated liver enzymes got better, AST returned to normal and ALT only mildly elevated. She is also working on her diet and exercise. Patient has No headache, No chest pain, No abdominal pain - No Nausea, No new weakness tingling or numbness, No Cough - SOB.  Problem  Cholelithiases  Hyperglycemia    ALLERGIES: No Known Allergies  PAST MEDICAL HISTORY: History reviewed. No pertinent past medical history.  MEDICATIONS AT HOME: Prior to Admission medications   Medication Sig Start Date End Date Taking? Authorizing Provider  traMADol (ULTRAM) 50 MG tablet Take 1 tablet (50 mg total) by mouth every 8 (eight) hours as needed. 12/04/13  Yes Richarda OverlieNayana Abrol, MD  gemfibrozil (LOPID) 600 MG tablet Take 1 tablet (600 mg total) by mouth AC breakfast. 12/08/13   Richarda OverlieNayana Abrol, MD  ondansetron (ZOFRAN) 4 MG tablet Take 1 tablet (4 mg total) by mouth every 8 (eight) hours as needed for nausea or vomiting.  12/04/13   Richarda OverlieNayana Abrol, MD     Objective:   Filed Vitals:   01/03/14 1020  BP: 120/84  Pulse: 75  Temp: 98.2 F (36.8 C)  TempSrc: Oral  Resp: 16  Height: 5' 2.5" (1.588 m)  Weight: 222 lb (100.699 kg)  SpO2: 99%    Exam  General appearance : Awake, alert, not in any distress. Speech Clear. Not toxic looking, obese HEENT: Atraumatic and Normocephalic, pupils equally reactive to light and accomodation Neck: supple, no JVD. No cervical lymphadenopathy.  Chest:Good air entry bilaterally, no added sounds  CVS: S1 S2 regular, no murmurs.  Abdomen: Bowel sounds present, Non tender and not distended with no gaurding, rigidity or rebound. Extremities: B/L Lower Ext shows no edema, both legs are warm to touch Neurology: Awake alert, and oriented X 3, CN II-XII intact, Non focal Skin:No Rash Wounds:N/A  Data Review Lab Results  Component Value Date   HGBA1C 5.9 01/03/2014     Assessment & Plan   1. Cholelithiases  - Ambulatory referral to General Surgery for possible elective cholecystectomy  2. Hyperglycemia  - POCT glycosylated hemoglobin (Hb A1C) is 5.9% today. Patient was informed of being prediabetic and she needs to adhere strictly with nutritional control and regular physical exercise  Patient was extensively counseled on nutrition and exercise   Return in about 6 months (around 07/05/2014), or if symptoms worsen or fail to improve, for Abdominal Pain.  The patient was given clear instructions to go to ER or return to medical center if symptoms don't  improve, worsen or new problems develop. The patient verbalized understanding. The patient was told to call to get lab results if they haven't heard anything in the next week.   This note has been created with Education officer, environmentalDragon speech recognition software and smart phrase technology. Any transcriptional errors are unintentional.    [name], MD, MHA, FACP, [facility]  [address] [place], [place] [phone]   01/03/2014, 1:08 PM

## 2014-01-03 NOTE — Progress Notes (Signed)
Pt is here wanting to address her gallstones.

## 2014-01-05 ENCOUNTER — Emergency Department (HOSPITAL_COMMUNITY): Payer: Self-pay

## 2014-01-05 ENCOUNTER — Emergency Department (HOSPITAL_COMMUNITY)
Admission: EM | Admit: 2014-01-05 | Discharge: 2014-01-05 | Disposition: A | Discharge status: Home / Self Care | Payer: Self-pay | Attending: Emergency Medicine | Admitting: Emergency Medicine

## 2014-01-05 ENCOUNTER — Encounter (HOSPITAL_COMMUNITY): Payer: Self-pay | Admitting: Emergency Medicine

## 2014-01-05 DIAGNOSIS — Z23 Encounter for immunization: Secondary | ICD-10-CM | POA: Insufficient documentation

## 2014-01-05 DIAGNOSIS — S62604A Fracture of unspecified phalanx of right ring finger, initial encounter for closed fracture: Secondary | ICD-10-CM

## 2014-01-05 DIAGNOSIS — T07XXXA Unspecified multiple injuries, initial encounter: Secondary | ICD-10-CM

## 2014-01-05 DIAGNOSIS — Z3202 Encounter for pregnancy test, result negative: Secondary | ICD-10-CM | POA: Insufficient documentation

## 2014-01-05 DIAGNOSIS — IMO0002 Reserved for concepts with insufficient information to code with codable children: Secondary | ICD-10-CM | POA: Insufficient documentation

## 2014-01-05 DIAGNOSIS — S0003XA Contusion of scalp, initial encounter: Secondary | ICD-10-CM | POA: Insufficient documentation

## 2014-01-05 DIAGNOSIS — R109 Unspecified abdominal pain: Secondary | ICD-10-CM

## 2014-01-05 DIAGNOSIS — S0083XA Contusion of other part of head, initial encounter: Secondary | ICD-10-CM | POA: Insufficient documentation

## 2014-01-05 DIAGNOSIS — R0602 Shortness of breath: Secondary | ICD-10-CM | POA: Insufficient documentation

## 2014-01-05 DIAGNOSIS — S199XXA Unspecified injury of neck, initial encounter: Secondary | ICD-10-CM

## 2014-01-05 DIAGNOSIS — R1011 Right upper quadrant pain: Secondary | ICD-10-CM | POA: Insufficient documentation

## 2014-01-05 DIAGNOSIS — R112 Nausea with vomiting, unspecified: Secondary | ICD-10-CM | POA: Insufficient documentation

## 2014-01-05 DIAGNOSIS — S3981XA Other specified injuries of abdomen, initial encounter: Secondary | ICD-10-CM | POA: Insufficient documentation

## 2014-01-05 DIAGNOSIS — S0993XA Unspecified injury of face, initial encounter: Secondary | ICD-10-CM | POA: Insufficient documentation

## 2014-01-05 DIAGNOSIS — S1093XA Contusion of unspecified part of neck, initial encounter: Secondary | ICD-10-CM

## 2014-01-05 LAB — PREGNANCY, URINE: Preg Test, Ur: NEGATIVE

## 2014-01-05 LAB — I-STAT CREATININE, ED: Creatinine, Ser: 0.6 mg/dL (ref 0.50–1.10)

## 2014-01-05 MED ORDER — IOHEXOL 300 MG/ML  SOLN
100.0000 mL | Freq: Once | INTRAMUSCULAR | Status: AC | PRN
  Administered 2014-01-05: 100 mL via INTRAVENOUS

## 2014-01-05 MED ORDER — OXYCODONE-ACETAMINOPHEN 5-325 MG PO TABS
2.0000 | ORAL_TABLET | Freq: Once | ORAL | Status: AC
  Administered 2014-01-05: 2 via ORAL
  Filled 2014-01-05: qty 2

## 2014-01-05 MED ORDER — TETANUS-DIPHTH-ACELL PERTUSSIS 5-2.5-18.5 LF-MCG/0.5 IM SUSP
0.5000 mL | Freq: Once | INTRAMUSCULAR | Status: AC
  Administered 2014-01-05: 0.5 mL via INTRAMUSCULAR
  Filled 2014-01-05: qty 0.5

## 2014-01-05 MED ORDER — ONDANSETRON HCL 4 MG/2ML IJ SOLN
4.0000 mg | Freq: Once | INTRAMUSCULAR | Status: AC
  Administered 2014-01-05: 4 mg via INTRAVENOUS
  Filled 2014-01-05: qty 2

## 2014-01-05 MED ORDER — MORPHINE SULFATE 4 MG/ML IJ SOLN
6.0000 mg | Freq: Once | INTRAMUSCULAR | Status: AC
  Administered 2014-01-05: 6 mg via INTRAVENOUS
  Filled 2014-01-05: qty 2

## 2014-01-05 MED ORDER — OXYCODONE-ACETAMINOPHEN 5-325 MG PO TABS: 2.0000 | ORAL_TABLET | ORAL | Status: DC | PRN

## 2014-01-05 NOTE — Progress Notes (Signed)
Orthopedic Tech Progress Note Patient Details:  Shawna MayotteRuth N Cortez-Pena [birth year] [id number]  Ortho Devices Type of Ortho Device: Finger splint Ortho Device/Splint Location: rue Ortho Device/Splint Interventions: Application   Khy Pitre 01/05/2014, 2:51 PM

## 2014-01-05 NOTE — ED Notes (Signed)
IV team paged.  

## 2014-01-05 NOTE — Discharge Instructions (Signed)
Please follow up with hand specialist next week for further evaluation of your finger injury.  Take pain medication as needed.  Use cool compress to affected area for comfort.  Apply over the counter neosporin over abrasions to decrease risk of infection.  Return to ER if your symptoms worsen or if you have other concerns.    Abrasin  (Abrasion) Una abrasin es un corte o una raspadura en la piel. Las abrasiones no se extienden a travs de todas las capas de la piel y la [place] Lak[place]ora se curan dentro de los [address] das. Es importante cuidar de la abrasin de [place]era adecuada para prevenir las infecciones.  CAUSAS  La mayora de las abrasiones son causadas por cadas o deslizamientos contra el suelo u otra superficie. Cuando la piel se frota en algo, la capa externa e interna de la piel se raspan, causando una abrasin.  DIAGNSTICO  El mdico diagnosticar una abrasin durante el examen fsico.  TRATAMIENTO  El tratamiento depende de la extensin y la profundidad de la abrasin. En general, podr limpiarla con agua y un jabn suave para eliminar la suciedad o residuos. Podr aplicarse un ungento antibitico para prevenir una infeccin. Deber colocarse un apsito (vendaje) alrededor de la abrasin para evitar que se ensucie.  Deber aplicarse la vacuna contra el ttanos si:  No recuerda cundo se coloc la vacuna la ltima vez.  Nunca recibi esta vacuna.  La lesin ha Huntsman Corporationabierto su piel. Si le han aplicado la vacuna contra el ttanos, el brazo podr hincharse, enrojecer y sentirse caliente al tacto. Esto es frecuente y no es un problema. Si usted necesita aplicarse la vacuna y se niega a recibirla, corre riesgo de contraer ttanos. La enfermedad por ttanos puede ser grave.  INSTRUCCIONES PARA EL CUIDADO EN EL HOGAR   Si le han colocado un vendaje, cmbielo por lo menos una vez por da o segn lo que le recomiende su mdico. Si el vendaje se adhiere, remjelo con agua tibia.   Lave el rea con agua y un  jabn American Standard Companiessuave dos veces al da para eliminar todo el ungento. Enjuague el jabn y seque suavemente la zona con una toalla limpia.  Vuelva a aplicar la pomada segn las indicaciones de su mdico. Esto le ayudar a prevenir las infecciones y a Automotive engineerevitar que el vendaje se Building services engineeradhiera. Utilice una gasa sobre la herida y bajo el apsito para evitar que el vendaje se pegue.   Cambie el vendaje inmediatamente si se moja o se ensucia.   Slo tome medicamentos de venta libre o recetados para Chief Technology Officerel dolor, el Dentistmalestar o la Buenaventura Lakesfiebre, segn las indicaciones de su mdico.   [place] MadisonHaga un control con su mdico dentro de las 24 a 48 horas para que vea la herida, o segn las indicaciones. Si no  le dieron fecha para un control, observe cuidadosamente la abrasin para ver si hay enrojecimiento, hinchazn o pus. Estos son signos de infeccin. SOLICITE ATENCIN MDICA DE INMEDIATO SI:   Siente mucho dolor en la herida.   Tiene enrojecimiento, hinchazn o sensibilidad en la herida.   Observa que sale pus de la herida.   Tiene fiebre o sntomas que persisten durante ms de 2 o 3 das.  Tiene fiebre y los sntomas empeoran de manera sbita.  Advierte un olor ftido que proviene de la herida o del vendaje.  ASEGRESE DE QUE:   Comprende estas instrucciones.  Controlar su enfermedad.  Solicitar ayuda de inmediato si no mejora o empeora. Document Released: 09/02/2005  Document Revised: 08/19/2012 [facility] Patient Information 2014 SuperiorExitCare, MarylandLLC.  Fractura de dedo Community education officer(Finger Fracture) La fractura de dedo se produce cuando uno o ms huesos del dedo se [place] Virgin Islandsquiebran.  CUIDADOS EN EL HOGAR   Use la frula, la cinta o el yeso todo el tiempo indicado por el mdico.  Regions Financial CorporationMantenga los dedos en la posicin que le indic el mdico.  Eleve la zona lesionada por encima del nivel del corazn.  Solo tome los medicamentos que le haya indicado su mdico.  Aplique hielo sobre la zona lesionada.  Ponga el hielo en una bolsa  plstica.  Colquese una toalla entre la piel y la bolsa de hielo.  Deje el hielo durante 15 a 20minutos y aplquelo 3 a 4veces por Futures traderda.  Concurra a las visitas de control con el mdico.  Pregunte qu ejercicios puede hacer cuando le saquen la frula. SOLICITE AYUDA DE INMEDIATO SI:   Las uas de los dedos estn blancas o Lawrenceazuladas.  Siente dolor y no lo Engelhard Corporationalivian los medicamentos.  No puede mover las puntas de los dedos.  Pierde la sensacin (tiene adormecimiento) en los dedos lesionados. ASEGRESE DE QUE:   Comprende estas instrucciones.  Controlar la enfermedad.  Recibir ayuda de inmediato si no mejora o si empeora. Document Released: 06/23/2013 [facility] Patient Information 2014 [place]ExitCare, MarylandLLC.

## 2014-01-05 NOTE — ED Notes (Signed)
Patient transported to CT 
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
## 2014-01-05 NOTE — ED Notes (Signed)
Family at bedside. 

## 2014-01-05 NOTE — ED Notes (Signed)
Patient transported to X-ray, per radiologist request

## 2014-01-05 NOTE — ED Notes (Signed)
Ortho tech contacted for placement of finger splint

## 2014-01-07 NOTE — ED Provider Notes (Signed)
Medical screening examination/treatment/procedure(s) were performed by non-physician practitioner and as supervising physician I was immediately available for consultation/collaboration.   EKG Interpretation None        Hermena Swint M Mccall Will, DO 01/07/14 0710 

## 2014-01-10 ENCOUNTER — Ambulatory Visit (INDEPENDENT_AMBULATORY_CARE_PROVIDER_SITE_OTHER): Payer: Self-pay | Admitting: Surgery

## 2014-01-10 ENCOUNTER — Encounter (INDEPENDENT_AMBULATORY_CARE_PROVIDER_SITE_OTHER): Payer: Self-pay | Admitting: Surgery

## 2014-01-10 VITALS — BP 116/70 | HR 70 | Temp 97.0°F | Resp 14 | Ht 66.0 in | Wt 219.8 lb

## 2014-01-10 DIAGNOSIS — K8020 Calculus of gallbladder without cholecystitis without obstruction: Secondary | ICD-10-CM

## 2014-01-10 MED ORDER — ACTIGALL 300 MG PO CAPS: 300.0000 mg | ORAL_CAPSULE | Freq: Two times a day (BID) | ORAL | Status: DC

## 2014-01-10 NOTE — Patient Instructions (Signed)
Colelitiasis (Cholelithiasis) La colelitiasis (tambin llamada clculos en la vescula) es una enfermedad en la que se forman piedras en la vescula. La vescula es un rgano que almacena la bilis que se forma en el hgado y que ayuda a Engineer, agriculturaldigerir grasas. Los clculos comienzan como pequeos cristales y lentamente se transforman en piedras. El dolor en la vescula ocurre cuando se producen espasmos y los clculos obstruyen el conducto. El dolor tambin se produce cuando una piedra sale por el conducto.  FACTORES DE RIESGO  Ser mujer.   Tener embarazos mltiples. Algunas veces los mdicos aconsejan extirpar los clculos biliares antes de futuros embarazos.   Ser obeso.  Dietas que incluyan comidas fritas y grasas.   Ser mayor de 6360 aos y el aumento de la edad.   El uso prolongado de medicamentos que contengan hormonas femeninas.   Tener diabetes mellitus.   Prdida rpida de peso.   Historia familiar de clculos (herencia).  SNTOMAS  Nuseas.   Vmitos.  Dolor abdominal.   Piel amarilla (ictericia)   Dolor sbito. Puede persistir desde algunos minutos hasta algunas horas.  Grant RutsFiebre.   Sensibilidad al tacto. En algunos casos, cuando los clculos biliares no se mueven hacia el conducto biliar, las personas no sienten dolor ni presentan sntomas. Estos se denominan clculos "silenciosos".  TRATAMIENTO Los clculos silenciosos no requieren TEFL teachertratamiento. En los Illinois Tool Workscasos graves, podr ser Bangladeshnecesaria una ciruga de urgencia. Las opciones de tratamiento son:  Kandis BanCiruga para extirpar la vescula. Es el tratamiento ms frecuente.  Medicamentos. No siempre dan resultado y pueden demorar entre 6 y 12 meses o ms en Scientist, water qualityhacer efecto.  Tratamiento con ondas de choque (litotricia biliar extracorporal). En este tratamiento, una mquina de ultrasonido enva ondas de choque a la vescula para destruir los clculos en pequeos fragmentos que luego podrn pasar a los intestinos o ser disueltas  con medicamentos. INSTRUCCIONES PARA EL CUIDADO EN EL HOGAR   Slo tome medicamentos de venta libre o recetados para Primary school teachercalmar el dolor, Environmental health practitionerel malestar o bajar la fiebre, segn las indicaciones de su mdico.   Siga una dieta baja en grasas hasta que su mdico lo vea nuevamente. Las grasas hacen que la vescula se Technical sales engineercontraiga, lo que puede Engineer, agriculturalproducir dolor.   Concurra a las consultas de control con su mdico segn las indicaciones. Los ataques casi siempre son recurrentes y generalmente habr que someterse a una ciruga como Woodsdaletratamiento permanente.  SOLICITE ATENCIN MDICA DE INMEDIATO SI:   El dolor aumenta y no puede controlarlo con los medicamentos.   Tiene fiebre o sntomas persistentes durante ms de 2 - 3 das.   Tiene fiebre y los sntomas empeoran repentinamente.   Tiene nuseas o vmitos persistentes.  ASEGRESE DE QUE:   Comprende estas instrucciones.  Controlar su afeccin.  Recibir ayuda de inmediato si no mejora o si empeora. Document Released: 06/19/2006 Document Revised: 05/05/2013 [facility]Exit[facility] Patient Information 2014 [place]villeExitCare, MarylandLLC.

## 2014-01-10 NOTE — Progress Notes (Signed)
Patient ID: Shawna MayotteRuth N Cortez-Pena, female   DOB: 07/16/1986, 28 y.o.   MRN: [id number]  Chief Complaint  Patient presents with  . New Evaluation    eval cholelithaasi    HPI Shawna MayotteRuth N Cortez-Pena is a 28 y.o. female.   HPI Patient sent at the request of Dr.Olugbemiga Hyman HopesJegede, MD Do to gallstone disease. She is in a history of gallstones. She has had intermittent history of right upper quadrant pain after eating. She was worked up in our emergency room with ultrasound, MRCP and evaluation. She was found to have gallstones without obstruction. She is no gallbladder wall thickening. She was recently seen do to her fracture of her proximal phalanx. He saw her primary care physician who sent her for surgical consultation for gallstones.  History reviewed. No pertinent past medical history.  History reviewed. No pertinent past surgical history.  Family History  Problem Relation Age of Onset  . Diabetes Mother   . Cancer Mother   . Heart disease Father   . Hypertension Father     Social History History  Substance Use Topics  . Smoking status: Never Smoker   . Smokeless tobacco: Not on file  . Alcohol Use: No    No Known Allergies  Current Outpatient Prescriptions  Medication Sig Dispense Refill  . gemfibrozil (LOPID) 600 MG tablet Take 1 tablet (600 mg total) by mouth AC breakfast.  30 tablet  2  . ondansetron (ZOFRAN) 4 MG tablet Take 1 tablet (4 mg total) by mouth every 8 (eight) hours as needed for nausea or vomiting.  20 tablet  0  . oxyCODONE-acetaminophen (PERCOCET/ROXICET) 5-325 MG per tablet Take 2 tablets by mouth every 4 (four) hours as needed for severe pain.  15 tablet  0  . ACTIGALL 300 MG capsule Take 1 capsule (300 mg total) by mouth 2 (two) times daily.  60 capsule  6   No current facility-administered medications for this visit.    Review of Systems Review of Systems  Constitutional: Negative for fever, chills and unexpected weight change.  HENT: Negative for  congestion, hearing loss, sore throat, trouble swallowing and voice change.   Eyes: Negative for visual disturbance.  Respiratory: Negative for cough and wheezing.   Cardiovascular: Negative for chest pain, palpitations and leg swelling.  Gastrointestinal: Positive for abdominal pain. Negative for nausea, vomiting, diarrhea, constipation, blood in stool, abdominal distention and anal bleeding.  Genitourinary: Negative for hematuria, vaginal bleeding and difficulty urinating.  Musculoskeletal: Negative for arthralgias.  Skin: Negative for rash and wound.  Neurological: Negative for seizures, syncope and headaches.  Hematological: Negative for adenopathy. Does not bruise/bleed easily.  Psychiatric/Behavioral: Negative for confusion.    Blood pressure 116/70, pulse 70, temperature 97 F (36.1 C), temperature source Temporal, resp. rate 14, height 5\' 6"  (1.676 m), weight 219 lb 12.8 oz (99.701 kg).  Physical Exam Physical Exam  Constitutional: She is oriented to person, place, and time. She appears well-developed and well-nourished.  HENT:  Head: Normocephalic and atraumatic.  Eyes: Pupils are equal, round, and reactive to light. No scleral icterus.  Neck: Normal range of motion. Neck supple.  Cardiovascular: Normal rate and regular rhythm.   Pulmonary/Chest: Effort normal.  Abdominal: Soft. There is no tenderness. There is no rigidity, no guarding and negative Murphy's sign.  Musculoskeletal: Normal range of motion.  Neurological: She is alert and oriented to person, place, and time.  Skin: Skin is warm and dry.  Psychiatric: She has a normal mood and affect. Her behavior  is normal. Judgment and thought content normal.    Data Reviewed CLINICAL DATA: Abdominal pain, vomiting.  EXAM:  ULTRASOUND ABDOMEN COMPLETE  COMPARISON: None.  FINDINGS:  Gallbladder:  Sludge and multiple small layering stones within the gallbladder,  the largest 4 mm. No wall thickening. Negative sonographic  Murphy's.  Common bile duct:  Diameter: Mildly prominent at 7 mm, upper limits normal. The distal  common bile duct cannot be visualized due to overlying bowel gas.  Liver:  Dense, coarsened echotexture throughout the liver suggesting fatty  infiltration. Hypoechoic area within the left lobe likely represents  focal fatty sparing. No biliary ductal dilatation or focal lesion.  IVC:  No abnormality visualized.  Pancreas:  Not visualized due to overlying bowel gas.  Spleen:  Size and appearance within normal limits.  Right Kidney:  Length: 10.4 cm. Echogenicity within normal limits. No mass or  hydronephrosis visualized.  Left Kidney:  Length: 11.1 cm. Echogenicity within normal limits. No mass or  hydronephrosis visualized.  Abdominal aorta:  No aneurysm visualized.  Other findings:  None.  IMPRESSION:  Numerous small gallstones within the gallbladder along with  gallbladder sludge. No sonographic evidence of acute cholecystitis.  Proximal common bile duct is mildly prominent at 7 mm. While this  could be upper limits of normal, recommend correlation with symptoms  and LFTs. If there is concern for a distal CBD stone, consider  further evaluation with MRCP or ERCP.  Electronically Signed  By: Charlett NoseKevin Dover M.D.  On: 11/20/2013 12:54   Assessment    Symptomatic cholelithiasis    Plan    Discussed surgical and  medical options of treatment of symptomatic cholelithiasis. This was  With a  Nurse, learning disabilityTranslator for  [place]. Medical and  surgical pros and cons discussed. The surgical procedure was felt to be the best for her but  she wished to pursue medical treatment. I explained the risk of medical management to include but not exclusive of worsening of symptoms, the need for emergent surgery, and medical treatment not providing any benefit. She  will take one year of  treatments and surveillance  U/S every 6 months ultrasound. She understands this and wishes to try medical management for  now and will call if symptoms worsen.The procedure has been discussed with the patient. Operative and non operative treatments have been discussed. Risks of surgery include bleeding, infection,  Common bile duct injury,  Injury to the stomach,liver, colon,small intestine, abdominal wall,  Diaphragm,  Major blood vessels,  And the need for an open procedure.  Other risks include worsening of medical problems, death,  DVT and pulmonary embolism, and cardiovascular events.   Medical options have also been discussed. The patient has been informed of long term expectations of surgery and non surgical options.  She wishes medication.  Started Actigall 300 po bid.  Follow up in 6 months or sooner.  Needs U/S in 6 months.          Jarmel Linhardt A. Othell Diluzio 01/10/2014, 2:51 PM

## 2014-07-05 ENCOUNTER — Ambulatory Visit: Payer: Self-pay | Admitting: Internal Medicine

## 2014-07-12 ENCOUNTER — Encounter: Payer: Self-pay | Admitting: Internal Medicine

## 2014-07-12 ENCOUNTER — Ambulatory Visit: Payer: Self-pay | Attending: Internal Medicine | Admitting: Internal Medicine

## 2014-07-12 VITALS — BP 123/82 | HR 68 | Temp 98.6°F | Resp 16 | Ht 63.0 in | Wt 215.0 lb

## 2014-07-12 DIAGNOSIS — E781 Pure hyperglyceridemia: Secondary | ICD-10-CM | POA: Insufficient documentation

## 2014-07-12 DIAGNOSIS — K8020 Calculus of gallbladder without cholecystitis without obstruction: Secondary | ICD-10-CM | POA: Insufficient documentation

## 2014-07-12 DIAGNOSIS — R739 Hyperglycemia, unspecified: Secondary | ICD-10-CM | POA: Insufficient documentation

## 2014-07-12 LAB — COMPLETE METABOLIC PANEL WITH GFR
ALT: 33 U/L (ref 0–35)
AST: 23 U/L (ref 0–37)
Albumin: 4.5 g/dL (ref 3.5–5.2)
Alkaline Phosphatase: 91 U/L (ref 39–117)
BILIRUBIN TOTAL: 0.4 mg/dL (ref 0.2–1.2)
BUN: 15 mg/dL (ref 6–23)
CO2: 23 mEq/L (ref 19–32)
Calcium: 9.4 mg/dL (ref 8.4–10.5)
Chloride: 101 mEq/L (ref 96–112)
Creat: 0.56 mg/dL (ref 0.50–1.10)
GFR, Est African American: >89 mL/min
GFR, Est Non African American: >89 mL/min
GLUCOSE: 98 mg/dL (ref 70–99)
Potassium: 4.3 mEq/L (ref 3.5–5.3)
SODIUM: 138 meq/L (ref 135–145)
TOTAL PROTEIN: 7.7 g/dL (ref 6.0–8.3)

## 2014-07-12 LAB — POCT GLYCOSYLATED HEMOGLOBIN (HGB A1C): Hemoglobin A1C: 5.7

## 2014-07-12 LAB — LIPID PANEL
Cholesterol: 163 mg/dL (ref 0–200)
HDL: 40 mg/dL (ref >39)
LDL CALC: 93 mg/dL (ref 0–99)
TRIGLYCERIDES: 151 mg/dL — AB (ref <150)
Total CHOL/HDL Ratio: 4.1 Ratio
VLDL: 30 mg/dL (ref 0–40)

## 2014-07-12 NOTE — Patient Instructions (Signed)
Opciones de alimentos para bajar el nivel de triglicridos (Food Choices to Lower Your Triglycerides) Los triglicridos son un tipo de grasas que se encuentran en la sangre. Un nivel elevado de triglicridos puede aumentar el riesgo de padecer enfermedades cardacas e infartos. Si sus niveles de triglicridos son altos, los alimentos que se ingieren y los hbitos de alimentacin son muy importantes. Elegir los alimentos adecuados puede ayudar a bajar el nivel de triglicridos.  QU PAUTAS GENERALES DEBO SEGUIR?  Baje de peso si es necesario.  Limite o evite el alcohol.  Llene la mitad del plato con vegetales y ensaladas de hojas verdes.  Limite las frutas a dos porciones por da. Elija frutas en lugar de jugos.  Ocupe un cuarto del plato con cereales integrales. Busque la palabra "integral" en el primer lugar de la lista de ingredientes.  Llene un cuarto del plato con alimentos con protenas magras.  Disfrute de pescados grasos (como salmn, caballa, sardinas y atn) tres veces por semana.  Elija las grasas saludables.  Limite los alimentos con alto contenido de almidn y azcar.  Consuma ms comida casera y menos de restaurante, de buf y comida rpida.  Limite el consumo de alimentos fritos.  Cocine los alimentos utilizando mtodos que no sean la fritura.  Limite el consumo de grasas saturadas.  Verifique las listas de ingredientes para evitar alimentos con aceites parcialmente hidrogenados (grasas trans). QU ALIMENTOS PUEDO COMER?  Cereales Cereales integrales, como los panes de salvado o integrales, las galletas, los cereales y las pastas. Avena sin endulzar, trigo, cebada, quinua o arroz integral. Tortillas de harina de maz o de salvado.  Vegetales Verduras frescas o congeladas (crudas, al vapor, asadas o grilladas). Ensaladas de hojas verdes. Fruits Frutas frescas, en conserva (en su jugo natural) o frutas congeladas. Carnes y otros productos con protenas Carne de  res molida (al 85% o ms magra), carne de res de animales alimentados con pastos o carne de res sin la grasa. Pollo o pavo sin piel. Carne de pollo o de pavo molida. Cerdo sin la grasa. Todos los pescados y frutos de mar. Huevos. Porotos, guisantes o lentejas secos. Frutos secos o semillas sin sal. Frijoles secos o en lata sin sal. Lcteos Productos lcteos con bajo contenido de grasas, como leche descremada o al 1%, quesos reducidos en grasas o al 2%, ricota con bajo contenido de grasas o queso cottage, o yogur natural con bajo contenido de grasas. Grasas y aceites Margarinas en barra que no contengan grasas trans. [place] y condimentos para ensaladas livianos o reducidos en grasas. [place]. Aceites de crtamo, oliva o canola. Mantequilla natural de man o almendra. Los artculos mencionados arriba pueden no ser una lista completa de las bebidas o los alimentos recomendados. Comunquese con el nutricionista para conocer ms opciones. QU ALIMENTOS NO SE RECOMIENDAN?  Cereales Pan blanco. Pastas blancas. Arroz blanco. Pan de maz. Bagels, pasteles y croissants. Galletas saladas que contengan grasas trans. Vegetales Papas blancas. Maz. Vegetales con crema o fritos. Verduras en salsa de queso. Fruits Frutas secas. Fruta enlatada en almbar liviano o espeso. Jugo de frutas. Carnes y otros productos con protenas Cortes de carne con grasa. Costillas, alas de pollo, tocineta, salchicha, mortadela, salame, chinchulines, tocino, perros calientes, salchichas alemanas y embutidos envasados. Lcteos Leche entera o al 2%, crema, mezcla de leche y crema y queso crema. Yogur entero o endulzado. Quesos con toda su grasa. Cremas no lcteas y coberturas batidas. Quesos procesados, quesos para untar o cuajadas. Dulces y postres Jarabe de   maz, azcares, miel y melazas. Caramelos. Mermelada y jalea. Jarabe. Cereales endulzados. Galletas, pasteles, bizcochuelos, donas, muffins y helado. Grasas y  aceites Mantequilla, margarina en barra, manteca de cerdo, grasa, mantequilla clarificada o grasa de tocino. Aceites de coco, de palmiste o de palma. Bebidas Alcohol. Bebidas endulzadas (como refrescos, limonadas y bebidas frutales o ponches). Los artculos mencionados arriba pueden no ser una lista completa de las bebidas y los alimentos que se deben evitar. Comunquese con el nutricionista para recibir ms informacin. Document Released: 02/20/2010 Document Revised: 09/07/2013 ExitCare Patient Information 2015 Exit[employer]. This information is not intended to replace advice given to you by your health care provider. Make sure you discuss any questions you have with your health care provider.  

## 2014-07-12 NOTE — Progress Notes (Signed)
Pt is here following up from her last visit with abdomen pain. Pt reports that she has no pain and no C.C. today

## 2014-07-12 NOTE — Progress Notes (Signed)
Patient ID: Shawna [name], female   DOB: 13-Jun-1986, 28 y.o.   MRN: [id number]   Shawna [name], is a 28 y.o. female  [id number]  [id number]  DOB - 13-Jun-1986  Chief Complaint  Patient presents with  . Follow-up        Subjective:   Shawna [name] is a 28 y.o. female here today for a follow up visit. Patient is following up chronic abdominal pain. Patient was previously evaluated with ultrasound of the abdomen which showed numerous gallstones and gallbladder sludge without evidence of cholecystitis. She has seen a general surgeon who discussed the possibility of cholecystectomy versus medical treatment, patient opted for medical treatment for now. She was started on Actigall 300 mg by mouth twice a day. Patient is feeling better. She has no new complaint today. Pain is much improved. Patient has No headache, No chest pain, No Nausea, no vomiting nor diarrhea, No new weakness tingling or numbness, No Cough - SOB.  Problem  Hypertriglyceridemia    ALLERGIES: No Known Allergies  PAST MEDICAL HISTORY: History reviewed. No pertinent past medical history.  MEDICATIONS AT HOME: Prior to Admission medications   Medication Sig Start Date End Date Taking? Authorizing Provider  ACTIGALL 300 MG capsule Take 1 capsule (300 mg total) by mouth 2 (two) times daily. 01/10/14  Yes Harriette Bouillonhomas Cornett, MD  gemfibrozil (LOPID) 600 MG tablet Take 1 tablet (600 mg total) by mouth AC breakfast. 12/08/13   Richarda OverlieNayana Abrol, MD  ondansetron (ZOFRAN) 4 MG tablet Take 1 tablet (4 mg total) by mouth every 8 (eight) hours as needed for nausea or vomiting. 12/04/13   Richarda OverlieNayana Abrol, MD  oxyCODONE-acetaminophen (PERCOCET/ROXICET) 5-325 MG per tablet Take 2 tablets by mouth every 4 (four) hours as needed for severe pain. 01/05/14   Fayrene HelperBowie Tran, PA-C     Objective:   Filed Vitals:   07/12/14 1023  BP: 123/82  Pulse: 68  Temp: 98.6 F (37 C)  TempSrc: Oral  Resp: 16  Height: 5\' 3"  (1.6 m)  Weight: 215  lb (97.523 kg)  SpO2: 99%    Exam General appearance : Awake, alert, not in any distress. Speech Clear. Not toxic looking HEENT: Atraumatic and Normocephalic, pupils equally reactive to light and accomodation Neck: supple, no JVD. No cervical lymphadenopathy.  Chest:Good air entry bilaterally, no added sounds  CVS: S1 S2 regular, no murmurs.  Abdomen: Bowel sounds present, Non tender and not distended with no gaurding, rigidity or rebound. Extremities: B/L Lower Ext shows no edema, both legs are warm to touch Neurology: Awake alert, and oriented X 3, CN II-XII intact, Non focal  Data Review Lab Results  Component Value Date   HGBA1C 5.9 01/03/2014     Assessment & Plan   1. Hyperglycemia  - POCT glycosylated hemoglobin (Hb A1C)   Aim for 2-3 Carb Choices per meal (30-45 grams) +/- 1 either way  Aim for 0-15 Carbs per snack if hungry  Include protein in moderation with your meals and snacks  Consider reading food labels for Total Carbohydrate and Fat Grams of foods  Consider checking BG at alternate times per day  Continue taking medication as directed Fruit Punch - find one with no sugar  Measure and decrease portions of carbohydrate foods  Make your plate and don't go back for seconds   2. Hypertriglyceridemia  - COMPLETE METABOLIC PANEL WITH GFR - Lipid panel  To address this please limit saturated fat to no more than 7% of your calories, limit cholesterol to  200 mg/day, increase fiber and exercise as tolerated. If needed we may add another cholesterol lowering medication to your regimen.   Interpreter was used to communicate directly with patient for the entire encounter including providing detailed patient instructions.   Return in about 6 months (around 01/11/2015), or if symptoms worsen or fail to improve, for Cholelithiasis.  The patient was given clear instructions to go to ER or return to medical center if symptoms don't improve, worsen or new problems  develop. The patient verbalized understanding. The patient was told to call to get lab results if they haven't heard anything in the next week.   This note has been created with Education officer, environmentalDragon speech recognition software and smart phrase technology. Any transcriptional errors are unintentional.    Jeanann LewandowskyJEGEDE, Tawonda Legaspi, MD, MHA, FACP, [facility], [facility] [place], [place] [phone]   07/12/2014, 11:04 AM

## 2014-07-29 ENCOUNTER — Ambulatory Visit: Payer: Self-pay

## 2014-11-19 ENCOUNTER — Encounter (HOSPITAL_COMMUNITY): Payer: Self-pay | Admitting: Physical Medicine and Rehabilitation

## 2014-11-19 ENCOUNTER — Emergency Department (HOSPITAL_COMMUNITY)
Admission: EM | Admit: 2014-11-19 | Discharge: 2014-11-20 | Disposition: A | Discharge status: Home / Self Care | Payer: Self-pay | Attending: Emergency Medicine | Admitting: Emergency Medicine

## 2014-11-19 ENCOUNTER — Emergency Department (HOSPITAL_COMMUNITY): Payer: Self-pay

## 2014-11-19 DIAGNOSIS — Z3202 Encounter for pregnancy test, result negative: Secondary | ICD-10-CM | POA: Insufficient documentation

## 2014-11-19 DIAGNOSIS — K8010 Calculus of gallbladder with chronic cholecystitis without obstruction: Secondary | ICD-10-CM | POA: Insufficient documentation

## 2014-11-19 LAB — CBC WITH DIFFERENTIAL/PLATELET
Basophils Absolute: 0 10*3/uL (ref 0.0–0.1)
Basophils Relative: 0 % (ref 0–1)
EOS PCT: 1 % (ref 0–5)
Eosinophils Absolute: 0.1 10*3/uL (ref 0.0–0.7)
HCT: 42.2 % (ref 36.0–46.0)
HEMOGLOBIN: 14.8 g/dL (ref 12.0–15.0)
LYMPHS ABS: 2.9 10*3/uL (ref 0.7–4.0)
Lymphocytes Relative: 42 % (ref 12–46)
MCH: 31 pg (ref 26.0–34.0)
MCHC: 35.1 g/dL (ref 30.0–36.0)
MCV: 88.5 fL (ref 78.0–100.0)
MONO ABS: 0.3 10*3/uL (ref 0.1–1.0)
Monocytes Relative: 5 % (ref 3–12)
Neutro Abs: 3.5 10*3/uL (ref 1.7–7.7)
Neutrophils Relative %: 52 % (ref 43–77)
Platelets: 361 10*3/uL (ref 150–400)
RBC: 4.77 MIL/uL (ref 3.87–5.11)
RDW: 12.7 % (ref 11.5–15.5)
WBC: 6.8 10*3/uL (ref 4.0–10.5)

## 2014-11-19 LAB — URINE MICROSCOPIC-ADD ON

## 2014-11-19 LAB — COMPREHENSIVE METABOLIC PANEL
ALT: 48 U/L — ABNORMAL HIGH (ref 0–35)
AST: 37 U/L (ref 0–37)
Albumin: 4.6 g/dL (ref 3.5–5.2)
Alkaline Phosphatase: 97 U/L (ref 39–117)
Anion gap: 6 (ref 5–15)
BILIRUBIN TOTAL: 0.5 mg/dL (ref 0.3–1.2)
BUN: 14 mg/dL (ref 6–23)
CALCIUM: 9.6 mg/dL (ref 8.4–10.5)
CO2: 30 mmol/L (ref 19–32)
CREATININE: 0.61 mg/dL (ref 0.50–1.10)
Chloride: 105 mmol/L (ref 96–112)
GFR calc non Af Amer: >90 mL/min (ref >90)
GLUCOSE: 94 mg/dL (ref 70–99)
Potassium: 3.6 mmol/L (ref 3.5–5.1)
Sodium: 141 mmol/L (ref 135–145)
Total Protein: 8.2 g/dL (ref 6.0–8.3)

## 2014-11-19 LAB — POC URINE PREG, ED: PREG TEST UR: NEGATIVE

## 2014-11-19 LAB — LIPASE, BLOOD: LIPASE: 25 U/L (ref 11–59)

## 2014-11-19 LAB — URINALYSIS, ROUTINE W REFLEX MICROSCOPIC
GLUCOSE, UA: NEGATIVE mg/dL
Ketones, ur: 15 mg/dL — AB
Nitrite: NEGATIVE
Protein, ur: 30 mg/dL — AB
Specific Gravity, Urine: 1.03 (ref 1.005–1.030)
Urobilinogen, UA: 0.2 mg/dL (ref 0.0–1.0)
pH: 5 (ref 5.0–8.0)

## 2014-11-19 MED ORDER — ONDANSETRON HCL 4 MG PO TABS: 4.0000 mg | ORAL_TABLET | Freq: Three times a day (TID) | ORAL | Status: DC | PRN

## 2014-11-19 MED ORDER — HYDROMORPHONE HCL 1 MG/ML IJ SOLN
1.0000 mg | Freq: Once | INTRAMUSCULAR | Status: AC
  Administered 2014-11-19: 1 mg via INTRAVENOUS
  Filled 2014-11-19: qty 1

## 2014-11-19 MED ORDER — ONDANSETRON HCL 4 MG/2ML IJ SOLN
4.0000 mg | Freq: Once | INTRAMUSCULAR | Status: AC
  Administered 2014-11-19: 4 mg via INTRAVENOUS
  Filled 2014-11-19: qty 2

## 2014-11-19 MED ORDER — SODIUM CHLORIDE 0.9 % IV SOLN
Freq: Once | INTRAVENOUS | Status: AC
  Administered 2014-11-19: 21:00:00 via INTRAVENOUS

## 2014-11-19 MED ORDER — OXYCODONE-ACETAMINOPHEN 5-325 MG PO TABS: 1.0000 | ORAL_TABLET | ORAL | Status: DC | PRN

## 2014-11-19 NOTE — Discharge Instructions (Signed)
Colelitiasis (Cholelithiasis) La colelitiasis (tambin llamada clculos en la vescula) es una enfermedad en la que se forman piedras en la vescula. La vescula es un rgano que almacena la bilis que se forma en el hgado y que ayuda a Engineer, agriculturaldigerir grasas. Los clculos comienzan como pequeos cristales y lentamente se transforman en piedras. El dolor en la vescula ocurre cuando se producen espasmos y los clculos obstruyen el conducto. El dolor tambin se produce cuando una piedra sale por el conducto.  FACTORES DE RIESGO  Ser mujer.   Tener embarazos mltiples. Algunas veces los mdicos aconsejan extirpar los clculos biliares antes de futuros embarazos.   Ser obeso.  Dietas que incluyan comidas fritas y grasas.   Ser mayor de 8060 aos y el aumento de la edad.   El uso prolongado de medicamentos que contengan hormonas femeninas.   Tener diabetes mellitus.   Prdida rpida de peso.   Historia familiar de clculos (herencia).  SNTOMAS  Nuseas.   Vmitos.  Dolor abdominal.   Piel amarilla (ictericia)   Dolor sbito. Puede persistir desde algunos minutos hasta algunas horas.  Grant RutsFiebre.   Sensibilidad al tacto. En algunos casos, cuando los clculos biliares no se mueven hacia el conducto biliar, las personas no sienten dolor ni presentan sntomas. Estos se denominan clculos "silenciosos".  TRATAMIENTO Los clculos silenciosos no requieren TEFL teachertratamiento. En los [place] Tool Workscasos graves, podr ser Bangladeshnecesaria una ciruga de urgencia. Las opciones de tratamiento son:  Kandis BanCiruga para extirpar la vescula. Es el tratamiento ms frecuente.  Medicamentos. No siempre dan resultado y pueden demorar entre 6 y 12 meses o ms en Scientist, water qualityhacer efecto.  Tratamiento con ondas de choque (litotricia biliar extracorporal). En este tratamiento, una mquina de ultrasonido enva ondas de choque a la vescula para destruir los clculos en pequeos fragmentos que luego podrn pasar a los intestinos o ser disueltas  con medicamentos. INSTRUCCIONES PARA EL CUIDADO EN EL HOGAR   Slo tome medicamentos de venta libre o recetados para Primary school teachercalmar el dolor, Environmental health practitionerel malestar o bajar la fiebre, segn las indicaciones de su mdico.   Siga una dieta baja en grasas hasta que su mdico lo vea nuevamente. Las grasas hacen que la vescula se Technical sales engineercontraiga, lo que puede Engineer, agriculturalproducir dolor.   Concurra a las consultas de control con su mdico segn las indicaciones. Los ataques casi siempre son recurrentes y generalmente habr que someterse a una ciruga como Vernon Centertratamiento permanente.  SOLICITE ATENCIN MDICA DE INMEDIATO SI:   El dolor aumenta y no puede controlarlo con los medicamentos.   Tiene fiebre o sntomas persistentes durante ms de 2 - 3 das.   Tiene fiebre y los sntomas empeoran repentinamente.   Tiene nuseas o vmitos persistentes.  ASEGRESE DE QUE:   Comprende estas instrucciones.  Controlar su afeccin.  Recibir ayuda de inmediato si no mejora o si empeora. Document Released: 06/19/2006 Document Revised: 05/05/2013 [facility] Patient Information 2015 [place], [place]. This information is not intended to replace advice given to you by your health care provider. Make sure you discuss any questions you have with your health care provider. Please call Dr. Michaell CowingGross on Monday for follow up return is you can not caontol your pain at home

## 2014-11-19 NOTE — ED Notes (Signed)
Pt presents to department for evaluation of RUQ abdominal pain and nausea/vomiting. Ongoing x4 days, was prescribed oxycodone, but no relief of pain. 7/10 RUQ pain upon arrival to ED. Pt is alert and oriented x4.

## 2014-11-19 NOTE — ED Notes (Signed)
NP at bedside.

## 2014-11-19 NOTE — ED Provider Notes (Signed)
CSN: [id number]     Arrival date & time 11/19/14  1739 History   First MD Initiated Contact with Patient 11/19/14 2049     Chief Complaint  Patient presents with  . Abdominal Pain  . Emesis     (Consider location/radiation/quality/duration/timing/severity/associated sxs/prior Treatment) HPI Comments: Patient with know gall stones currently taking Actigall to dissolve the stones, prescribed by Dr. Michaell CowingGross for the past 2 months presents with RU pain nausea and vomiting for 2 days, Has been taking Percocet for pain without relief  Patient is a 29 y.o. female presenting with abdominal pain and vomiting. The history is provided by the patient. The history is limited by a language barrier. A language interpreter was used.  Abdominal Pain Pain location:  RUQ Pain quality: aching   Pain radiates to:  Does not radiate Pain severity:  Moderate Onset quality:  Gradual Duration:  4 days Timing:  Constant Progression:  Unchanged Chronicity:  Recurrent Context: eating   Relieved by:  Nothing Worsened by:  Eating Ineffective treatments: narcotic. Associated symptoms: nausea and vomiting   Associated symptoms: no chest pain, no constipation, no cough, no diarrhea, no dysuria, no fever and no shortness of breath   Nausea:    Severity:  Moderate   Onset quality:  Unable to specify   Duration:  4 days   Timing:  Constant   Progression:  Worsening Vomiting:    Quality:  Bilious material   Number of occurrences:  Many   Severity:  Moderate   Duration:  4 days   Timing:  Intermittent   Progression:  Unchanged Risk factors: obesity   Emesis Associated symptoms: abdominal pain   Associated symptoms: no diarrhea     History reviewed. No pertinent past medical history. History reviewed. No pertinent past surgical history. Family History  Problem Relation Age of Onset  . Diabetes Mother   . Cancer Mother   . Heart disease Father   . Hypertension Father    History  Substance Use Topics   . Smoking status: Never Smoker   . Smokeless tobacco: Not on file  . Alcohol Use: No   OB History    No data available     Review of Systems  Constitutional: Negative for fever.  Respiratory: Negative for cough and shortness of breath.   Cardiovascular: Negative for chest pain.  Gastrointestinal: Positive for nausea, vomiting and abdominal pain. Negative for diarrhea and constipation.  Genitourinary: Negative for dysuria.       Last cycle lighter than normal   All other systems reviewed and are negative.     Allergies  Review of patient's allergies indicates no known allergies.  Home Medications   Prior to Admission medications   Medication Sig Start Date End Date Taking? Authorizing Provider  ACTIGALL 300 MG capsule Take 1 capsule (300 mg total) by mouth 2 (two) times daily. Patient not taking: Reported on 11/19/2014 01/10/14   Harriette Bouillonhomas Cornett, MD  gemfibrozil (LOPID) 600 MG tablet Take 1 tablet (600 mg total) by mouth AC breakfast. Patient not taking: Reported on 11/19/2014 12/08/13   Richarda OverlieNayana Abrol, MD  ondansetron (ZOFRAN) 4 MG tablet Take 1 tablet (4 mg total) by mouth every 8 (eight) hours as needed for nausea or vomiting. 11/19/14   Arman FilterGail K Rawson Minix, NP  oxyCODONE-acetaminophen (PERCOCET/ROXICET) 5-325 MG per tablet Take 1-2 tablets by mouth every 4 (four) hours as needed for severe pain. 11/19/14   Arman FilterGail K Darsi Tien, NP   BP 123/79 mmHg  Pulse 83  Temp(Src) 98.3 F (36.8 C) (Oral)  Resp 20  SpO2 98% Physical Exam  Constitutional: She is oriented to person, place, and time. She appears well-developed and well-nourished.  Eyes: Pupils are equal, round, and reactive to light.  Neck: Normal range of motion.  Cardiovascular: Normal rate.   Pulmonary/Chest: Effort normal.  Abdominal: She exhibits no distension. There is tenderness in the right upper quadrant.  Musculoskeletal: Normal range of motion.  Neurological: She is alert and oriented to person, place, and time.  Skin: Skin is  warm and dry.  Vitals reviewed.   ED Course  Procedures (including critical care time) Labs Review Labs Reviewed  COMPREHENSIVE METABOLIC PANEL - Abnormal; Notable for the following:    ALT 48 (*)    All other components within normal limits  URINALYSIS, ROUTINE W REFLEX MICROSCOPIC - Abnormal; Notable for the following:    Color, Urine AMBER (*)    APPearance CLOUDY (*)    Hgb urine dipstick LARGE (*)    Bilirubin Urine SMALL (*)    Ketones, ur 15 (*)    Protein, ur 30 (*)    Leukocytes, UA SMALL (*)    All other components within normal limits  URINE MICROSCOPIC-ADD ON - Abnormal; Notable for the following:    Squamous Epithelial / LPF MANY (*)    Bacteria, UA MANY (*)    All other components within normal limits  CBC WITH DIFFERENTIAL/PLATELET  LIPASE, BLOOD  POC URINE PREG, ED  POC URINE PREG, ED    Imaging Review US Abdomen Complete  11/19/2014   CLINICAL DATA:  Abdominal pain and emesis  EXAM: ULTRASOUND ABDOMEN COMPLETE  COMPARISON:  11/20/2013  FINDINGS: Gallbladder: Gallstones fill the gallbladder. There is no definite wall thickening, although limited by numerous shadowing gallstones. Per sonographer exam there is focal tenderness.  Common bile duct: Diameter: 6 mm  Liver: Echogenic liver with heterogeneous echotexture and mild decrease in acoustic penetration. No focal abnormality.  IVC: No abnormality visualized.  Pancreas: Visualized portion unremarkable.  Spleen: Size and appearance within normal limits.  Right Kidney: Length: 11 cm. Echogenicity within normal limits. No mass or hydronephrosis visualized.  Left Kidney: Length: 10 cm. Echogenicity within normal limits. No mass or hydronephrosis visualized.  Abdominal aorta: Normal mid abdominal aorta at 1.8 cm diameter. The proximal and distal aorta is obscured by bowel gas, but were normal in 2015.  IMPRESSION: 1. Cholelithiasis. Detection of inflammatory gallbladder wall thickening is limited by the multiplicity of  shadowing stones. Due to focal tenderness, cannot exclude gallbladder obstruction or cholecystitis. 2. Hepatic steatosis.   Electronically Signed   By: Marnee Spring M.D.   On: 11/19/2014 23:13     EKG Interpretation None     Pateint has been pain free since receiving IV Dilaudid X1 Ultrasound shows no change Will renew  Pecocet and DC home with instructions to FU with Dr. Michaell Cowing on Monday  MDM   Final diagnoses:  Calculus of gallbladder with chronic cholecystitis without obstruction        Arman Filter, NP 11/19/14 2332  Gilda Crease, MD 11/19/14 2336

## 2015-04-13 ENCOUNTER — Emergency Department (HOSPITAL_COMMUNITY): Payer: Self-pay

## 2015-04-13 ENCOUNTER — Emergency Department (HOSPITAL_COMMUNITY)
Admission: EM | Admit: 2015-04-13 | Discharge: 2015-04-13 | Disposition: A | Discharge status: Home / Self Care | Payer: Self-pay | Attending: Emergency Medicine | Admitting: Emergency Medicine

## 2015-04-13 ENCOUNTER — Encounter (HOSPITAL_COMMUNITY): Payer: Self-pay | Admitting: Emergency Medicine

## 2015-04-13 DIAGNOSIS — Z3491 Encounter for supervision of normal pregnancy, unspecified, first trimester: Secondary | ICD-10-CM

## 2015-04-13 DIAGNOSIS — O2341 Unspecified infection of urinary tract in pregnancy, first trimester: Secondary | ICD-10-CM | POA: Insufficient documentation

## 2015-04-13 DIAGNOSIS — Z8719 Personal history of other diseases of the digestive system: Secondary | ICD-10-CM | POA: Insufficient documentation

## 2015-04-13 DIAGNOSIS — N390 Urinary tract infection, site not specified: Secondary | ICD-10-CM

## 2015-04-13 DIAGNOSIS — Z3A01 Less than 8 weeks gestation of pregnancy: Secondary | ICD-10-CM | POA: Insufficient documentation

## 2015-04-13 HISTORY — DX: Calculus of gallbladder without cholecystitis without obstruction: K80.20

## 2015-04-13 LAB — URINE MICROSCOPIC-ADD ON

## 2015-04-13 LAB — COMPREHENSIVE METABOLIC PANEL
ALBUMIN: 3.9 g/dL (ref 3.5–5.0)
ALK PHOS: 78 U/L (ref 38–126)
ALT: 35 U/L (ref 14–54)
AST: 28 U/L (ref 15–41)
Anion gap: 11 (ref 5–15)
BUN: 10 mg/dL (ref 6–20)
CHLORIDE: 102 mmol/L (ref 101–111)
CO2: 23 mmol/L (ref 22–32)
Calcium: 9.3 mg/dL (ref 8.9–10.3)
Creatinine, Ser: 0.49 mg/dL (ref 0.44–1.00)
GFR calc Af Amer: >60 mL/min (ref >60)
Glucose, Bld: 123 mg/dL — ABNORMAL HIGH (ref 65–99)
Potassium: 4.3 mmol/L (ref 3.5–5.1)
Sodium: 136 mmol/L (ref 135–145)
Total Bilirubin: 0.7 mg/dL (ref 0.3–1.2)
Total Protein: 7.1 g/dL (ref 6.5–8.1)

## 2015-04-13 LAB — URINALYSIS, ROUTINE W REFLEX MICROSCOPIC
Bilirubin Urine: NEGATIVE
GLUCOSE, UA: NEGATIVE mg/dL
Ketones, ur: NEGATIVE mg/dL
Nitrite: NEGATIVE
Protein, ur: NEGATIVE mg/dL
Specific Gravity, Urine: 1.013 (ref 1.005–1.030)
UROBILINOGEN UA: 0.2 mg/dL (ref 0.0–1.0)
pH: 6 (ref 5.0–8.0)

## 2015-04-13 LAB — LIPASE, BLOOD: Lipase: 19 U/L — ABNORMAL LOW (ref 22–51)

## 2015-04-13 LAB — CBC WITH DIFFERENTIAL/PLATELET
BASOS PCT: 0 % (ref 0–1)
Basophils Absolute: 0 10*3/uL (ref 0.0–0.1)
Eosinophils Absolute: 0.1 10*3/uL (ref 0.0–0.7)
Eosinophils Relative: 1 % (ref 0–5)
HCT: 40.7 % (ref 36.0–46.0)
HEMOGLOBIN: 14.1 g/dL (ref 12.0–15.0)
Lymphocytes Relative: 39 % (ref 12–46)
Lymphs Abs: 2.7 10*3/uL (ref 0.7–4.0)
MCH: 31.3 pg (ref 26.0–34.0)
MCHC: 34.6 g/dL (ref 30.0–36.0)
MCV: 90.2 fL (ref 78.0–100.0)
Monocytes Absolute: 0.5 10*3/uL (ref 0.1–1.0)
Monocytes Relative: 7 % (ref 3–12)
Neutro Abs: 3.6 10*3/uL (ref 1.7–7.7)
Neutrophils Relative %: 53 % (ref 43–77)
Platelets: 344 10*3/uL (ref 150–400)
RBC: 4.51 MIL/uL (ref 3.87–5.11)
RDW: 13.3 % (ref 11.5–15.5)
WBC: 6.9 10*3/uL (ref 4.0–10.5)

## 2015-04-13 LAB — I-STAT BETA HCG BLOOD, ED (MC, WL, AP ONLY): I-stat hCG, quantitative: 312.4 m[IU]/mL — ABNORMAL HIGH (ref <5)

## 2015-04-13 MED ORDER — CEPHALEXIN 500 MG PO CAPS: 500.0000 mg | ORAL_CAPSULE | Freq: Four times a day (QID) | ORAL | Status: DC

## 2015-04-13 NOTE — ED Notes (Signed)
Pt placed in gown and in bed. Pt monitored by pulse ox and bp cuff. 

## 2015-04-13 NOTE — ED Notes (Signed)
Pt had abd pain since yesterday. Pt had positive pregnancy test a few weeks ago. Pt states she was wiping yesterday and had some blood. Denies blood clots

## 2015-04-13 NOTE — Discharge Instructions (Signed)
Follow-up in one week for repeat labs. Follow-up in 2 weeks for repeat ultrasound. Return to ED for new or worsening symptoms. Take your medication for your UTI.  Primer trimestre de Psychiatrist (First Trimester of Pregnancy) El primer trimestre de Psychiatrist se extiende desde la semana1 hasta el final de la semana12 (mes1 al mes3). Una semana despus de que un espermatozoide fecunda un vulo, este se implantar en la pared uterina. Este embrin comenzar a Camera operator convertirse en un beb. Sus genes y los de su pareja forman el beb. Los genes del varn determinan si ser un nio o una nia. Entre la semana6 y [employer], se forman los ojos y [place], y los latidos del corazn pueden verse en la ecografa. Al final de las 12semanas, todos los rganos del beb estn formados.  Ahora que est embarazada, querr hacer todo lo que est a su alcance para tener un beb sano. Dos de las cosas ms importantes son Winferd Humphrey buena atencin prenatal y seguir las indicaciones del mdico. La atencin prenatal incluye toda la asistencia mdica que usted recibe antes del nacimiento del beb. Esta ayudar a prevenir, Engineer, manufacturing y tratar cualquier problema durante el embarazo y [place]. CAMBIOS EN EL ORGANISMO Su organismo atraviesa por muchos cambios durante el [place], y estos varan de Neomia Dear mujer a Educational psychologist.   Al principio, puede aumentar o bajar algunos kilos.  Puede tener Programme researcher, broadcasting/film/video (nuseas) y vomitar. Si no puede controlar los vmitos, llame al mdico.  Puede cansarse con facilidad.  Es posible que tenga dolores de cabeza que pueden aliviarse con los medicamentos que el mdico le permita tomar.  Puede orinar con mayor frecuencia. El dolor al orinar puede significar que usted tiene una infeccin de la vejiga.  Debido al [place]etta Mulders, puede tener acidez estomacal.  Puede estar estreida, ya que ciertas hormonas enlentecen los movimientos de los msculos que [employer] desechos a travs de los  intestinos.  Pueden aparecer hemorroides o abultarse e hincharse las venas (venas varicosas).  Las [employer] pueden empezar a Government social research officer y Emergency planning/management officer. Los pezones pueden sobresalir ms, y el tejido que los rodea (areola) tornarse ms oscuro.  Las Veterinary surgeon y estar sensibles al cepillado y al hilo dental.  Pueden aparecer zonas oscuras o manchas (cloasma, mscara del Psychiatrist) en el rostro que probablemente se atenuarn despus del nacimiento del beb.  Los perodos menstruales se interrumpirn.  Tal vez no tenga apetito.  Puede sentir un fuerte deseo de consumir ciertos alimentos.  Puede tener cambios a Theatre manager a da, por ejemplo, por momentos puede estar emocionada por el Psychiatrist y por otros preocuparse porque algo pueda salir mal con el embarazo o el beb.  Tendr sueos ms vvidos y extraos.  Tal vez haya cambios en el cabello que pueden incluir su engrosamiento, crecimiento rpido y cambios en la textura. A algunas mujeres tambin se les cae el cabello durante o despus del [place], o tienen el cabello seco o fino. Lo ms probable es que el cabello se le normalice despus del nacimiento del beb. QU DEBE ESPERAR EN LAS CONSULTAS PRENATALES Durante una visita prenatal de rutina:  La pesarn para asegurarse de que usted y el beb estn creciendo normalmente.  Le controlarn la presin arterial.  Le medirn el abdomen para controlar el desarrollo del beb.  Se escucharn los latidos cardacos a partir de la semana10 o la12 de embarazo, aproximadamente.  Se analizarn los resultados de los estudios solicitados en visitas anteriores. El mdico  puede preguntarle:  Cmo se siente.  Si siente los movimientos del beb.  Si ha tenido sntomas anormales, como prdida de lquido, [place], dolores de cabeza intensos o clicos abdominales.  Si tiene [employer]. Otros estudios que pueden realizarse durante el primer trimestre incluyen lo  siguiente:  Anlisis de sangre para determinar el tipo de sangre y Engineer, manufacturing la presencia de infecciones previas. Adems, se los usar para controlar si los niveles de hierro son bajos (anemia) y Chief Strategy Officer los anticuerpos Rh. En una etapa ms avanzada del [place], se harn anlisis de sangre para saber si tiene diabetes, junto con otros estudios si surgen problemas.  Anlisis de orina para detectar infecciones, diabetes o protenas en la orina.  Una ecografa para confirmar que el beb crece y se desarrolla correctamente.  Una amniocentesis para diagnosticar posibles problemas genticos.  Estudios del feto para descartar espina bfida y sndrome de [place].  Es posible que necesite otras pruebas adicionales. INSTRUCCIONES PARA EL CUIDADO EN EL HOGAR  Medicamentos:  Siga las indicaciones del mdico en relacin con el uso de medicamentos. Durante el embarazo, hay medicamentos que pueden tomarse y otros que no.  Tome las vitaminas prenatales como se le indic.  Si est estreida, tome un laxante suave, si el mdico lo [place]. Dieta  Consuma alimentos balanceados. Elija alimentos variados, como carne o protenas de origen vegetal, pescado, leche y productos lcteos descremados, verduras, frutas y panes y Radiation protection practitioner. El mdico la ayudar a Production assistant, radio cantidad de peso que puede [place].  No coma carne cruda ni quesos sin cocinar. Estos elementos contienen bacterias que pueden causar defectos congnitos en el beb.  La ingesta diaria de cuatro o cinco comidas pequeas en lugar de tres comidas abundantes puede ayudar a [employer] nuseas y los vmitos. Si empieza a tener nuseas, comer algunas [address] puede ser de [place]. Beber lquidos [place] comidas en lugar de tomarlos durante las comidas tambin puede ayudar a Optician, dispensing las nuseas y los vmitos.  Si est estreida, consuma alimentos con alto contenido de [place], como verduras y frutas frescas, y Radiation protection practitioner.  Beba suficiente lquido para Photographer orina clara o de color amarillo plido. Actividad y Landscape architect ejercicio solamente como se lo haya indicado el mdico. El ejercicio la ayudar a:  Art gallery manager.  Mantenerse en forma.  Estar preparada para el trabajo de parto y [place].  Los dolores, los clicos en la parte baja del abdomen o los calambres en la cintura son un buen indicio de que debe dejar de Corporate treasurer. Consulte al mdico antes de seguir haciendo ejercicios normales.  Intente no estar de pie [employer]. Mueva las piernas con frecuencia si debe estar de pie en un lugar durante mucho tiempo.  Evite levantar pesos [employer].  Use zapatos de tacones bajos y [place].  Puede seguir teniendo [employer] [place] [place] [employer], excepto que el mdico le indique lo contrario. Alivio del dolor o las molestias  Use un sostn que le brinde buen soporte si siente dolor a la palpacin [employer].  Dese baos de asiento con agua tibia para Engineer, materials o las molestias causadas por las hemorroides. Use crema antihemorroidal si el mdico se lo permite.  Descanse con las piernas elevadas si tiene calambres o dolor de cintura.  Si tiene venas varicosas en las piernas, use medias de descanso. Eleve los pies durante , 3 o 4veces por da. Limite la cantidad de sal en su dieta. Cuidados  prenatales  Programe las visitas prenatales para la semana12 de [place]. Generalmente se programan cada mes al principio y se hacen ms frecuentes en los 2 ltimos meses antes del parto.  Escriba sus preguntas. Llvelas cuando concurra a las visitas prenatales.  Concurra a todas las visitas prenatales como se lo haya indicado el mdico. Seguridad  Colquese el cinturn de seguridad cuando conduzca.  Haga una lista de los nmeros de telfono de Associate Professor, que [employer] nmeros de telfono de familiares, [place], el hospital y los departamentos de polica y  bomberos. Consejos generales  Pdale al mdico que la derive a clases de educacin prenatal en su localidad. Debe comenzar a tomar las clases antes de Cytogeneticist en el mes6 de embarazo.  Pida ayuda si tiene necesidades nutricionales o de asesoramiento Academic librarian. El mdico puede aconsejarla o derivarla a especialistas para que la ayuden con diferentes necesidades.  No se d baos de inmersin en agua caliente, baos turcos ni saunas.  No se haga duchas vaginales ni use tampones o toallas higinicas perfumadas.  No mantenga las piernas cruzadas durante [place].  Evite el contacto con las bandejas sanitarias de los gatos y la tierra que estos animales usan. Estos elementos contienen bacterias que pueden causar defectos congnitos al beb y la posible prdida del feto debido a un aborto espontneo o muerte fetal.  No fume, no consuma hierbas ni medicamentos que no hayan sido recetados por el mdico. Las sustancias qumicas que estos productos contienen afectan la formacin y el desarrollo del beb.  Programe una cita con el dentista. En su casa, lvese los dientes con un cepillo dental blando y psese el hilo dental con suavidad. SOLICITE ATENCIN MDICA SI:   Tiene mareos.  Siente clicos leves, presin en la pelvis o dolor persistente en el abdomen.  Tiene nuseas, vmitos o diarrea persistentes.  Tiene secrecin vaginal con mal olor.  Siente dolor al [employer].  Tiene el rostro, las [place], las piernas o los tobillos ms hinchados. SOLICITE ATENCIN MDICA DE INMEDIATO SI:   Tiene fiebre.  Tiene una prdida de lquido por la vagina.  Tiene sangrado o pequeas prdidas vaginales.  Siente dolor intenso o clicos en el abdomen.  Sube o baja de peso rpidamente.  Vomita sangre de color rojo brillante o material que parezca granos de caf.  Ha estado expuesta a la rubola y no ha sufrido la enfermedad.  Ha estado expuesta a la quinta enfermedad o a la  varicela.  Tiene un dolor de cabeza intenso.  Le falta el aire.  Sufre cualquier tipo de traumatismo, por ejemplo, debido a una cada o un accidente automovilstico. Document Released: 06/12/2005 Document Revised: 01/17/2014 [facility] Patient Information 2015 [place], [place]. This information is not intended to replace advice given to you by your health care provider. Make sure you discuss any questions you have with your health care provider.

## 2015-04-13 NOTE — ED Provider Notes (Signed)
CSN: [id number]     Arrival date & time 04/13/15  0807 History   First MD Initiated Contact with Patient 04/13/15 0813     Chief Complaint  Patient presents with  . Abdominal Pain   History of present illness obtained revealing which interpreter.  (Consider location/radiation/quality/duration/timing/severity/associated sxs/prior Treatment) HPI Shawna [name] is a 29 y.o. female G5 P2022 who presents for evaluation of abdominal discomfort. Patient says she took a pregnancy test last week that was positive, began to have sharp, intermittent abdominal discomfort yesterday. She reports after wiping there was blood on the toilet paper, but no blood in the urine or in the toilet bowl. She reports associated mild nausea. She denies fevers, chills, chest pain, shortness of breath, constipation, diarrhea, numbness, weakness, dizziness. States that [facility], but was told if she had a medical problems come to the ED. Denies any other medical problems at this time.  Past Medical History  Diagnosis Date  . Gallstone    History reviewed. No pertinent past surgical history. Family History  Problem Relation Age of Onset  . Diabetes Mother   . Cancer Mother   . Heart disease Father   . Hypertension Father    History  Substance Use Topics  . Smoking status: Never Smoker   . Smokeless tobacco: Not on file  . Alcohol Use: No   OB History    Gravida Para Term Preterm AB TAB SAB Ectopic Multiple Living   1              Review of Systems A 10 point review of systems was completed and was negative except for pertinent positives and negatives as mentioned in the history of present illness     Allergies  Review of patient's allergies indicates no known allergies.  Home Medications   Prior to Admission medications   Medication Sig Start Date End Date Taking? Authorizing Provider  ACTIGALL 300 MG capsule Take 1 capsule (300 mg total) by mouth 2  (two) times daily. Patient not taking: Reported on 11/19/2014 01/10/14   Harriette Bouillon, MD  gemfibrozil (LOPID) 600 MG tablet Take 1 tablet (600 mg total) by mouth AC breakfast. Patient not taking: Reported on 11/19/2014 12/08/13   Richarda Overlie, MD  ondansetron (ZOFRAN) 4 MG tablet Take 1 tablet (4 mg total) by mouth every 8 (eight) hours as needed for nausea or vomiting. 11/19/14   Earley Favor, NP  oxyCODONE-acetaminophen (PERCOCET/ROXICET) 5-325 MG per tablet Take 1-2 tablets by mouth every 4 (four) hours as needed for severe pain. 11/19/14   Earley Favor, NP   BP 116/67 mmHg  Pulse 76  Temp(Src) 98.1 F (36.7 C)  Resp 16  Wt 213 lb (96.616 kg)  SpO2 100%  LMP 03/02/2015 Physical Exam  Constitutional: She is oriented to person, place, and time. She appears well-developed and well-nourished. No distress.  HENT:  Head: Normocephalic and atraumatic.  Mouth/Throat: Oropharynx is clear and moist. No oropharyngeal exudate.  Eyes: Conjunctivae are normal. Pupils are equal, round, and reactive to light. Right eye exhibits no discharge. Left eye exhibits no discharge. No scleral icterus.  Neck: Normal range of motion. Neck supple.  Cardiovascular: Normal rate, regular rhythm and normal heart sounds.  Exam reveals no gallop and no friction rub.   No murmur heard. Pulmonary/Chest: Effort normal and breath sounds normal. No respiratory distress. She has no wheezes. She has no rales.  Abdominal: Soft. She exhibits no distension and no mass.  There is no tenderness. There is no rebound and no guarding.  Musculoskeletal: Normal range of motion. She exhibits no tenderness.  Neurological: She is alert and oriented to person, place, and time.  Cranial Nerves II-XII grossly intact  Skin: Skin is warm and dry. No rash noted. She is not diaphoretic.  Psychiatric: She has a normal mood and affect.  Nursing note and vitals reviewed.   ED Course  Procedures (including critical care time) Labs Review Labs  Reviewed  COMPREHENSIVE METABOLIC PANEL - Abnormal; Notable for the following:    Glucose, Bld 123 (*)    All other components within normal limits  LIPASE, BLOOD - Abnormal; Notable for the following:    Lipase 19 (*)    All other components within normal limits  URINALYSIS, ROUTINE W REFLEX MICROSCOPIC (NOT AT [facility]) - Abnormal; Notable for the following:    APPearance CLOUDY (*)    Hgb urine dipstick LARGE (*)    Leukocytes, UA LARGE (*)    All other components within normal limits  URINE MICROSCOPIC-ADD ON - Abnormal; Notable for the following:    Squamous Epithelial / LPF FEW (*)    Bacteria, UA FEW (*)    All other components within normal limits  I-STAT BETA HCG BLOOD, ED ([facility], [facility], [facility] ONLY) - Abnormal; Notable for the following:    I-stat hCG, quantitative 312.4 (*)    All other components within normal limits  CBC WITH DIFFERENTIAL/PLATELET    Imaging Review US Ob Comp Less 14 Wks  04/13/2015   CLINICAL DATA:  First trimester of pregnancy, abdominal pain and vaginal bleeding.  EXAM: OBSTETRIC <14 WK [place] AND TRANSVAGINAL OB US  TECHNIQUE: Both transabdominal and transvaginal ultrasound examinations were performed for complete evaluation of the gestation as well as the maternal uterus, adnexal regions, and pelvic cul-de-sac. Transvaginal technique was performed to assess early pregnancy.  COMPARISON:  None.  FINDINGS: Intrauterine gestational sac: Visualized/normal in shape.  Yolk sac:  Not visualized.  Embryo:  Not visualized.  Cardiac Activity: Not visualized.  MSD: 6  mm   5 w   2  d  Maternal uterus/adnexae: Ovaries appear normal. No free fluid is noted. 2.0 x 1.9 x 1.2 cm simple cyst is seen in the right adnexal region.  IMPRESSION: Probable early intrauterine gestational sac, but no yolk sac, fetal pole, or cardiac activity yet visualized. 2 cm simple left adnexal cyst is noted as well. Recommend follow-up quantitative B-HCG levels and follow-up US in 14 days to confirm and assess  viability. This recommendation follows SRU consensus guidelines: Diagnostic Criteria for Nonviable Pregnancy Early in the First Trimester. Malva Limes Med 2013; 161:0960-45.   Electronically Signed   By: Lupita Raider, M.D.   On: 04/13/2015 10:37   US Ob Transvaginal  04/13/2015   CLINICAL DATA:  First trimester of pregnancy, abdominal pain and vaginal bleeding.  EXAM: OBSTETRIC <14 WK [place] AND TRANSVAGINAL OB US  TECHNIQUE: Both transabdominal and transvaginal ultrasound examinations were performed for complete evaluation of the gestation as well as the maternal uterus, adnexal regions, and pelvic cul-de-sac. Transvaginal technique was performed to assess early pregnancy.  COMPARISON:  None.  FINDINGS: Intrauterine gestational sac: Visualized/normal in shape.  Yolk sac:  Not visualized.  Embryo:  Not visualized.  Cardiac Activity: Not visualized.  MSD: 6  mm   5 w   2  d  Maternal uterus/adnexae: Ovaries appear normal. No free fluid is noted. 2.0 x 1.9 x 1.2 cm simple  cyst is seen in the right adnexal region.  IMPRESSION: Probable early intrauterine gestational sac, but no yolk sac, fetal pole, or cardiac activity yet visualized. 2 cm simple left adnexal cyst is noted as well. Recommend follow-up quantitative B-HCG levels and follow-up US in 14 days to confirm and assess viability. This recommendation follows SRU consensus guidelines: Diagnostic Criteria for Nonviable Pregnancy Early in the First Trimester. Malva Limes Med 2013; 161:0960-45.   Electronically Signed   By: Lupita Raider, M.D.   On: 04/13/2015 10:37     EKG Interpretation None     Meds given in ED:  Medications - No data to display  New Prescriptions   No medications on file   Filed Vitals:   04/13/15 0900 04/13/15 0930 04/13/15 1036 04/13/15 1100  BP: 110/76 107/59 114/63 116/67  Pulse: 72 84 71 76  Temp:      Resp:      Weight:      SpO2: 98% 99% 99% 100%    MDM  Vitals stable - WNL -afebrile Pt resting comfortably in  ED. PE--benign abdominal exam. Labwork--beta hCG 312, evidence of UTI, labs otherwise noncontributory. Imaging--OB ultrasound shows no evidence of ectopic. Recommends follow-up in 14 days.  DDX--patient with discomfort likely secondary to UTI. Low suspicion for ectopic pregnancy, TOA, PID or other emergent pathology. Discussed the patient she will need repeat labs completed to ensure appropriate rise in beta hCG as well as repeat ultrasound in 14 days. Strict return precautions given. Will DC with Keflex for UTI. Discussed avoidance of NSAIDs. Also discussed initiation of prenatal vitamin. I discussed all relevant lab findings and imaging results with pt and they verbalized understanding. Discussed f/u with PCP within 48 hrs and return precautions, pt very amenable to plan.  Final diagnoses:  Pregnant and not yet delivered in first trimester        Joycie Peek, PA-C 04/13/15 1131  Arby Barrette, MD 04/24/15 1446

## 2015-05-03 ENCOUNTER — Emergency Department (HOSPITAL_COMMUNITY): Payer: Self-pay

## 2015-05-03 ENCOUNTER — Emergency Department (HOSPITAL_COMMUNITY)
Admission: EM | Admit: 2015-05-03 | Discharge: 2015-05-04 | Disposition: A | Discharge status: Home / Self Care | Payer: Self-pay | Attending: Emergency Medicine | Admitting: Emergency Medicine

## 2015-05-03 ENCOUNTER — Encounter (HOSPITAL_COMMUNITY): Payer: Self-pay | Admitting: Emergency Medicine

## 2015-05-03 DIAGNOSIS — Z3A09 9 weeks gestation of pregnancy: Secondary | ICD-10-CM | POA: Insufficient documentation

## 2015-05-03 DIAGNOSIS — Z8719 Personal history of other diseases of the digestive system: Secondary | ICD-10-CM | POA: Insufficient documentation

## 2015-05-03 DIAGNOSIS — O039 Complete or unspecified spontaneous abortion without complication: Secondary | ICD-10-CM | POA: Insufficient documentation

## 2015-05-03 DIAGNOSIS — Z792 Long term (current) use of antibiotics: Secondary | ICD-10-CM | POA: Insufficient documentation

## 2015-05-03 DIAGNOSIS — R1032 Left lower quadrant pain: Secondary | ICD-10-CM | POA: Insufficient documentation

## 2015-05-03 LAB — URINE MICROSCOPIC-ADD ON

## 2015-05-03 LAB — COMPREHENSIVE METABOLIC PANEL
ALBUMIN: 3.9 g/dL (ref 3.5–5.0)
ALK PHOS: 102 U/L (ref 38–126)
ALT: 47 U/L (ref 14–54)
AST: 39 U/L (ref 15–41)
Anion gap: 10 (ref 5–15)
BILIRUBIN TOTAL: 0.5 mg/dL (ref 0.3–1.2)
BUN: 14 mg/dL (ref 6–20)
CALCIUM: 9.1 mg/dL (ref 8.9–10.3)
CO2: 26 mmol/L (ref 22–32)
Chloride: 102 mmol/L (ref 101–111)
Creatinine, Ser: 0.62 mg/dL (ref 0.44–1.00)
GFR calc Af Amer: >60 mL/min (ref >60)
GFR calc non Af Amer: >60 mL/min (ref >60)
GLUCOSE: 143 mg/dL — AB (ref 65–99)
POTASSIUM: 3.8 mmol/L (ref 3.5–5.1)
SODIUM: 138 mmol/L (ref 135–145)
TOTAL PROTEIN: 7.3 g/dL (ref 6.5–8.1)

## 2015-05-03 LAB — CBC
HEMATOCRIT: 40.7 % (ref 36.0–46.0)
Hemoglobin: 14.4 g/dL (ref 12.0–15.0)
MCH: 31.4 pg (ref 26.0–34.0)
MCHC: 35.4 g/dL (ref 30.0–36.0)
MCV: 88.9 fL (ref 78.0–100.0)
Platelets: 386 10*3/uL (ref 150–400)
RBC: 4.58 MIL/uL (ref 3.87–5.11)
RDW: 12.7 % (ref 11.5–15.5)
WBC: 7 10*3/uL (ref 4.0–10.5)

## 2015-05-03 LAB — URINALYSIS, ROUTINE W REFLEX MICROSCOPIC
BILIRUBIN URINE: NEGATIVE
GLUCOSE, UA: NEGATIVE mg/dL
HGB URINE DIPSTICK: NEGATIVE
Ketones, ur: 15 mg/dL — AB
Nitrite: NEGATIVE
Protein, ur: NEGATIVE mg/dL
SPECIFIC GRAVITY, URINE: 1.036 — AB (ref 1.005–1.030)
UROBILINOGEN UA: 0.2 mg/dL (ref 0.0–1.0)
pH: 6 (ref 5.0–8.0)

## 2015-05-03 LAB — LIPASE, BLOOD: Lipase: 19 U/L — ABNORMAL LOW (ref 22–51)

## 2015-05-03 LAB — HCG, QUANTITATIVE, PREGNANCY

## 2015-05-03 NOTE — ED Notes (Signed)
Patient transported to Ultrasound with Raiford Noble, Transporter

## 2015-05-03 NOTE — ED Notes (Addendum)
C/o lower abd pain since yesterday.  Pt states she is 1 month pregnant.  Denies vaginal bleeding, nausea, or vomiting.  Seen 2-3 weeks ago for UTI.

## 2015-05-03 NOTE — ED Notes (Signed)
Pt requesting use of interpreter during assessments

## 2015-05-03 NOTE — Discharge Instructions (Signed)
Today's pregnancy hormone number shows no evidence of any further pregnancy which means you had a miscarriage. Return for any new or worse symptoms. Resource guide provided below to help you with follow-up in the future. Any additional women type problems can be dealt with at the [facility] acute walk-in clinic. That is on [employer].   Dolor abdominal en el embarazo (Abdominal Pain During Pregnancy) El dolor abdominal es frecuente durante el embarazo. Generalmente no causa ningn dao. El dolor abdominal puede tener numerosas causas. Algunas causas son ms graves que otras. Ciertas causas de dolor abdominal durante el embarazo se diagnostican fcilmente. A veces, se tarda un tiempo para llegar al diagnstico. Otras veces la causa no se conoce. El dolor abdominal puede estar relacionado con [place] alteracin del [place], o puede deberse a una causa totalmente diferente. Por este motivo, siempre consulte a su mdico cuando sienta molestias abdominales. INSTRUCCIONES PARA EL CUIDADO EN EL HOGAR  Est atenta al dolor para ver si hay cambios. Las siguientes indicaciones ayudarn a Psychologist, educational [employer] pueda sentir:  No Chiropodist sexuales y no coloque nada dentro de la vagina hasta que los sntomas hayan desaparecido completamente.  Descanse todo lo que pueda [employer] dolor se le haya calmado.  Si siente nuseas, beba lquidos claros. Evite los alimentos slidos mientras sienta malestar o tenga nuseas.  Tome slo medicamentos de venta libre o recetados, segn las indicaciones del mdico.  Cumpla con todas las visitas de control, segn le indique su mdico. SOLICITE ATENCIN MDICA DE INMEDIATO SI:  Tiene un sangrado, prdida de lquidos o elimina tejidos por la vagina.  El dolor o los clicos Adrian.  Tiene vmitos persistentes.  Comienza a Financial risk analyst al orinar u [employer].  Tiene fiebre.  Nota que los movimientos del beb disminuyen.  Siente  intensa debilidad o se marea.  Tiene dificultad para respirar con o sin dolor abdominal.  Siente un dolor de cabeza intenso junto al dolor abdominal.  Shelle Iron secrecin vaginal anormal con dolor abdominal.  Tiene diarrea persistente.  El dolor abdominal sigue o empeora an despus de Field seismologist. ASEGRESE DE QUE:   Comprende estas instrucciones.  Controlar su afeccin.  Recibir ayuda de inmediato si no mejora o si empeora. Document Released: 09/02/2005 Document Revised: 06/23/2013 [facility] Patient Information 2015 [place], [place]. This information is not intended to replace advice given to you by your health care provider. Make sure you discuss any questions you have with your health care provider.   Emergency Department Resource Guide 1) Find a Doctor and Pay Out of Pocket Although you won't have to find out who is covered by your insurance plan, it is a good idea to ask around and get recommendations. You will then need to call the office and see if the doctor you have chosen will accept you as a new patient and what types of options they offer for patients who are self-pay. Some doctors offer discounts or will set up payment plans for their patients who do not have insurance, but you will need to ask so you aren't surprised when you get to your appointment.  2) Contact Your Local Health Department Not all health departments have doctors that can see patients for sick visits, but many do, so it is worth a call to see if yours does. If you don't know where your local health department is, you can check in your phone book. The CDC also has a tool to help you locate  your state's health department, and many state websites also have listings of all of their local health departments.  3) Find a Walk-in Clinic If your illness is not likely to be very severe or complicated, you may want to try a walk in clinic. These are popping up all over the country in pharmacies, drugstores, and  shopping centers. They're usually staffed by nurse practitioners or physician assistants that have been trained to treat common illnesses and complaints. They're usually fairly quick and inexpensive. However, if you have serious medical issues or chronic medical problems, these are probably not your best option.  No Primary Care Doctor: - Call Health Connect at  [phone] - they can help you locate a primary care doctor that  accepts your insurance, provides certain services, etc. - Physician Referral Service- [phone]  Chronic Pain Problems: Organization         Address  Phone   Notes  [facility]  [phone] Patients need to be referred by their primary care doctor.   Medication Assistance: Organization         Address  Phone   Notes  [facility] Medication [facility] [address] [place], [place] [address] [phone] --Must be a resident of [facility] -- Must have NO insurance coverage whatsoever (no Medicaid/ Medicare, etc.) -- The pt. MUST have a primary care doctor that directs their care regularly and follows them in the community   Med[facility]ist  [phone]   [employer]  [phone]    Agencies that provide inexpensive medical care: Organization         Address  Phone   Notes  [facility]  [phone]   [facility]    [phone]   [facility] [address] [place], [place] [address] [phone]   [facility] [address] [place] [place] [phone]   Planned Parenthood    [phone]   [facility]    [phone]   [facility]  [address], [place] Phone:  [phone], Fax:  [phone] Hours of Operation:  9 am - 6 pm, M-F.  Also accepts Medicaid/Medicare and self-pay.  [facility]  [address], [place] Phone: [phone],  Fax: [phone]. Hours of Operation:  8:30 am - 5:30 pm, M-F.  Also accepts Medicaid and self-pay.  [facility] [place] [address], [place] Phone: [phone]   [facility] [address] Natasha Bence [place], [place] [phone], Ext. [phone] Mondays & Thursdays: 7-9 AM.  First 15 patients are seen on a first come, first serve basis.    Medicaid-accepting [facility] Providers:  Organization         Address  Phone   Notes  [facility] [address], [place] [phone] Also accepts self-pay patients.  [facility] [address] Laurell Josephs [place], [place]  [phone]   [facility] [address], [place] [phone]   [facility] [address] [place] [phone]   Renaye Rakers [address], [place]   [phone] Only accepts [place] patients after they have their name applied to their card.   Self-Pay (no insurance) in [facility]:  Organization  Address  Phone   Notes  Sickle Cell Patients, [facility] [address] [place], [place] [phone]   [facility]gent Care [address] [place], [place] [phone]   [facility]  [address], [place] [phone]   [facility]/Dr. Osei-Bonsu  [address], [place] or [address], [place] [phone] Phone number for both [place] and [place] locations is the same.  [facility] [address], [place] [phone]   [facility] [address], [place] or [address] [phone]   [facility] [address] [place] [phone], phone; [phone], fax Sees patients 1st and 3rd Saturday of every month.  Must not qualify for public or private  insurance (i.e. Medicaid, Medicare, [place] Health Choice, Veterans' Benefits)  Household income should be no more than 200% of the poverty level The clinic cannot treat you if you are pregnant or think you are pregnant  Sexually transmitted diseases are not treated at the clinic.    Dental Care: Organization         Address  Phone  Notes  [facility] of [facility] [address] [place], [place] [phone] Accepts children up to age 91 who are enrolled in [place] or [place] Health Choice; pregnant women with a Medicaid card; and children who have applied for Medicaid or [place] Health Choice, but were declined, whose parents can pay a reduced fee at time of service.  [facility] Department of [facility]  [address], [place] [phone] Accepts children up to age 75 who are enrolled in [place] or [place] Health Choice; pregnant women with a Medicaid card; and children who have applied for Medicaid or  Health Choice, but were declined, whose parents can pay a reduced fee at time of service.  [facility]  [address] [place], [place] [phone] Patients are seen by appointment only. Walk-ins are not accepted. [facility] will see patients 77 years of age and older. Monday - Tuesday (8am-5pm) Most Wednesdays (8:30-5pm) $30 per visit, cash only  [facility] Access [facility]  [address], [facility] [phone] Patients are seen by appointment only. Walk-ins are not accepted. [facility] will see patients 27 years of age and older. One Wednesday Evening (Monthly: Volunteer Based).  $30 per visit, cash only  [employer]  [phone] for adults; Children under age 62, call Graduate Pediatric Dentistry at [phone]. Children aged 36-14, please call [phone] to request a pediatric application.  Dental services are provided in all areas of dental care  including fillings, crowns and bridges, complete and partial dentures, implants, gum treatment, root canals, and extractions. Preventive care is also provided. Treatment is provided to both adults and children. Patients are selected via a lottery and there is often a waiting list.   [facility] [address], [place]  [phone] [email or web address]   [facility] [address] [place], [place] [phone], Ext. [phone] Second and Fourth Thursday of each month, opens at 6:30 AM; Clinic ends at 9 AM.  Patients are seen on a first-come first-served basis, and a limited number are seen during each clinic.   [facility]  [address] [place], [place], [place] [phone]   Eligibility Requirements You must have lived in [place], [place], or [place] counties for at least the last three months.   You cannot be eligible for state or federal sponsored National Cityhealthcare insurance, including CIGNAVeterans Administration, [place]icaid, or [employer]icare.   You generally cannot be eligible for healthcare insurance through your employer.    How to apply: Eligibility screenings are held every Tuesday and Wednesday afternoon from 1:00 pm until 4:00 pm. You do not need an appointment for the interview!  [facility] [address], [place], [place] [phone]   [facility]  [phone]   [facility]  [phone]   [facility]  [phone]    Behavioral Health Resources in the Community: Intensive Outpatient Programs Organization         Address  Phone  Notes  [facility] [address], [place], [place] [phone]   [facility] [name], [place], [place] [phone]   [facility] [address], [place], [place]  [phone]   [facility] [address],  [place], [place] [phone] or [phone]     Substance Abuse Resources Organization         Address  Phone  Notes  [facility]  [phone]   [facility]  [phone]   [facility]  [phone]   [facility]  [phone]   Residential & Outpatient Substance Abuse Program  [phone]   Psychological Services Organization         Address  Phone  Notes  [facility]  [phone]- [phone]   [facility]  [phone]- [phone]   [facility] [address], [place] [phone] or [phone]    Mobile Crisis Teams Organization         Address  Phone  Notes  [facility], Mobile Crisis Care Unit  [phone]   [facility]  [address] [place], [place] [phone]   Doristine LocksSharon DeEsch [address] [place] [phone]    Self-Help/Support Groups Organization         Address  Phone             Notes  [facility] [place] - variety of support groups  336- [phone] Call for more information  Narcotics Anonymous (NA), [facility] [address], [place]  2 meetings at this location   Statisticianesidential Treatment Programs Organization         Address  Phone  Notes  [facility] [address],    [place]  [phone]   [facility]  [address], [place] [phone], [place], [place] [phone]   [facility] [address], [place] [phone] Admissions: 8am-3pm M-F  [facility] [address]    [place], [place] [phone]   [facility] [address], [place], [place] [phone]   [facility] [address]  [place], [place] [phone]   [facility] - Intensive Outpatient [address], [place], [place] [phone]   [facility] ([facility] [address]  [place], [place] [phone] or [phone]   [facility]  Services (RTS) [address] [place], [place] [phone] Accepts Medicaid  [facility] [address]  [place] [phone] Substance Abuse/Addiction Treatment   [facility] Resources  Organization         Address  Phone  Notes  °[facility]  [phone]   °Julie Brannon, PhD [address] [place], [place]   ([phone] or (336) [phone]   °[facility]   [address] °[place], [place] ([phone]) [phone]   °[facility] [address], [place], [place] [phone]) [phone] Insurance/Medicaid/sponsorship through [facility]  °[facility] [address]                                    [place], [place] [phone] Therapy/tele-psych/case  °[facility] [address]  ° [place], [place] [phone]    °Dr. Arfeen  ([phone]) [phone]   °[facility] [place]  [employer] [place] [facility] 1) [address],  °2) [address], [place] °3)  [address] [place] [address], [place] [phone]) [phone] °([phone]) [phone] ° °([phone]) [phone]   °[place] Child Abuse Hotline (336) [phone] or (336) [phone] (After Hours)    ° ° ° °

## 2015-05-03 NOTE — ED Notes (Signed)
Pt back to room B19 

## 2015-05-03 NOTE — ED Provider Notes (Addendum)
CSN: [id number]     Arrival date & time 05/03/15  1739 History   First MD Initiated Contact with Patient 05/03/15 2019     Chief Complaint  Patient presents with  . Abdominal Pain     (Consider location/radiation/quality/duration/timing/severity/associated sxs/prior Treatment) Patient is a 29 y.o. female presenting with abdominal pain. The history is provided by the patient.  Abdominal Pain Associated symptoms: no chest pain, no dysuria, no fever, no nausea, no shortness of breath, no vaginal bleeding and no vomiting    patient seen the end of July for lower abdominal pain and some questionable vaginal bleeding. Positive pregnancy test quantitative hCG was 372. Patient underwent ultrasound that was somewhat equivocal no distinct yolk sac. No fetal heart tones. Patient was to have follow-up quantitative hCG and repeat ultrasound [facility] however patient did not seem to understand and did not follow-up. Patient has had persistent lower quadrant abdominal pain left lower quadrant greater than right. No vaginal bleeding. Denies any fevers denies any dysuria. Patient is gravida 5 para 2. Patient with a positive pregnancy test about a month ago.  Past Medical History  Diagnosis Date  . Gallstone   . Pregnant    History reviewed. No pertinent past surgical history. Family History  Problem Relation Age of Onset  . Diabetes Mother   . Cancer Mother   . Heart disease Father   . Hypertension Father    Social History  Substance Use Topics  . Smoking status: Never Smoker   . Smokeless tobacco: None  . Alcohol Use: No   OB History    Gravida Para Term Preterm AB TAB SAB Ectopic Multiple Living   1              Review of Systems  Constitutional: Negative for fever.  HENT: Negative for congestion.   Eyes: Negative for redness.  Respiratory: Negative for shortness of breath.   Cardiovascular: Negative for chest pain.  Gastrointestinal: Positive for abdominal pain.  Negative for nausea and vomiting.  Genitourinary: Negative for dysuria and vaginal bleeding.  Musculoskeletal: Negative for back pain.  Skin: Negative for rash.  Neurological: Negative for headaches.  Hematological: Does not bruise/bleed easily.  Psychiatric/Behavioral: Negative for confusion.      Allergies  Review of patient's allergies indicates no known allergies.  Home Medications   Prior to Admission medications   Medication Sig Start Date End Date Taking? Authorizing Provider  ACTIGALL 300 MG capsule Take 1 capsule (300 mg total) by mouth 2 (two) times daily. Patient not taking: Reported on 11/19/2014 01/10/14   Harriette Bouillon, MD  cephALEXin (KEFLEX) 500 MG capsule Take 1 capsule (500 mg total) by mouth 4 (four) times daily. 04/13/15   Joycie Peek, PA-C  gemfibrozil (LOPID) 600 MG tablet Take 1 tablet (600 mg total) by mouth AC breakfast. Patient not taking: Reported on 11/19/2014 12/08/13   Richarda Overlie, MD  ondansetron (ZOFRAN) 4 MG tablet Take 1 tablet (4 mg total) by mouth every 8 (eight) hours as needed for nausea or vomiting. 11/19/14   Earley Favor, NP  oxyCODONE-acetaminophen (PERCOCET/ROXICET) 5-325 MG per tablet Take 1-2 tablets by mouth every 4 (four) hours as needed for severe pain. 11/19/14   Earley Favor, NP   BP 105/62 mmHg  Pulse 85  Temp(Src) 98.6 F (37 C) (Oral)  Resp 18  SpO2 100%  LMP 03/02/2015 Physical Exam  Constitutional: She is oriented to person, place, and time. She appears well-developed and well-nourished. No  distress.  HENT:  Head: Normocephalic and atraumatic.  Mouth/Throat: Oropharynx is clear and moist.  Eyes: Conjunctivae and EOM are normal. Pupils are equal, round, and reactive to light.  Neck: Normal range of motion. Neck supple.  Cardiovascular: Normal rate, regular rhythm and normal heart sounds.   Pulmonary/Chest: Effort normal and breath sounds normal. No respiratory distress.  Abdominal: Soft. Bowel sounds are normal. There is no  tenderness.  Musculoskeletal: Normal range of motion. She exhibits no edema.  Neurological: She is alert and oriented to person, place, and time. No cranial nerve deficit. She exhibits normal muscle tone. Coordination normal.  Skin: Skin is warm. No rash noted.    ED Course  Procedures (including critical care time) Labs Review Labs Reviewed  LIPASE, BLOOD - Abnormal; Notable for the following:    Lipase 19 (*)    All other components within normal limits  COMPREHENSIVE METABOLIC PANEL - Abnormal; Notable for the following:    Glucose, Bld 143 (*)    All other components within normal limits  URINALYSIS, ROUTINE W REFLEX MICROSCOPIC (NOT AT [facility]) - Abnormal; Notable for the following:    APPearance HAZY (*)    Specific Gravity, Urine 1.036 (*)    Ketones, ur 15 (*)    Leukocytes, UA SMALL (*)    All other components within normal limits  URINE MICROSCOPIC-ADD ON - Abnormal; Notable for the following:    Squamous Epithelial / LPF MANY (*)    Bacteria, UA FEW (*)    All other components within normal limits  CBC  HCG, QUANTITATIVE, PREGNANCY   Results for orders placed or performed during the hospital encounter of 05/03/15  Lipase, blood  Result Value Ref Range   Lipase 19 (L) 22 - 51 U/L  Comprehensive metabolic panel  Result Value Ref Range   Sodium 138 135 - 145 mmol/L   Potassium 3.8 3.5 - 5.1 mmol/L   Chloride 102 101 - 111 mmol/L   CO2 26 22 - 32 mmol/L   Glucose, Bld 143 (H) 65 - 99 mg/dL   BUN 14 6 - 20 mg/dL   Creatinine, Ser 1.61 0.44 - 1.00 mg/dL   Calcium 9.1 8.9 - 09.6 mg/dL   Total Protein 7.3 6.5 - 8.1 g/dL   Albumin 3.9 3.5 - 5.0 g/dL   AST 39 15 - 41 U/L   ALT 47 14 - 54 U/L   Alkaline Phosphatase 102 38 - 126 U/L   Total Bilirubin 0.5 0.3 - 1.2 mg/dL   GFR calc non Af Amer >60 >60 mL/min   GFR calc Af Amer >60 >60 mL/min   Anion gap 10 5 - 15  CBC  Result Value Ref Range   WBC 7.0 4.0 - 10.5 K/uL   RBC 4.58 3.87 - 5.11 MIL/uL   Hemoglobin 14.4  12.0 - 15.0 g/dL   HCT 04.5 40.9 - 81.1 %   MCV 88.9 78.0 - 100.0 fL   MCH 31.4 26.0 - 34.0 pg   MCHC 35.4 30.0 - 36.0 g/dL   RDW 91.4 78.2 - 95.6 %   Platelets 386 150 - 400 K/uL  Urinalysis, Routine w reflex microscopic (not at [facility])  Result Value Ref Range   Color, Urine YELLOW YELLOW   APPearance HAZY (A) CLEAR   Specific Gravity, Urine 1.036 (H) 1.005 - 1.030   pH 6.0 5.0 - 8.0   Glucose, UA NEGATIVE NEGATIVE mg/dL   Hgb urine dipstick NEGATIVE NEGATIVE   Bilirubin Urine NEGATIVE NEGATIVE  Ketones, ur 15 (A) NEGATIVE mg/dL   Protein, ur NEGATIVE NEGATIVE mg/dL   Urobilinogen, UA 0.2 0.0 - 1.0 mg/dL   Nitrite NEGATIVE NEGATIVE   Leukocytes, UA SMALL (A) NEGATIVE  Urine microscopic-add on  Result Value Ref Range   Squamous Epithelial / LPF MANY (A) RARE   WBC, UA 11-20 <3 WBC/hpf   Bacteria, UA FEW (A) RARE   Urine-Other MUCOUS PRESENT    Results for orders placed or performed during the hospital encounter of 05/03/15  Lipase, blood  Result Value Ref Range   Lipase 19 (L) 22 - 51 U/L  Comprehensive metabolic panel  Result Value Ref Range   Sodium 138 135 - 145 mmol/L   Potassium 3.8 3.5 - 5.1 mmol/L   Chloride 102 101 - 111 mmol/L   CO2 26 22 - 32 mmol/L   Glucose, Bld 143 (H) 65 - 99 mg/dL   BUN 14 6 - 20 mg/dL   Creatinine, Ser 1.61 0.44 - 1.00 mg/dL   Calcium 9.1 8.9 - 09.6 mg/dL   Total Protein 7.3 6.5 - 8.1 g/dL   Albumin 3.9 3.5 - 5.0 g/dL   AST 39 15 - 41 U/L   ALT 47 14 - 54 U/L   Alkaline Phosphatase 102 38 - 126 U/L   Total Bilirubin 0.5 0.3 - 1.2 mg/dL   GFR calc non Af Amer >60 >60 mL/min   GFR calc Af Amer >60 >60 mL/min   Anion gap 10 5 - 15  CBC  Result Value Ref Range   WBC 7.0 4.0 - 10.5 K/uL   RBC 4.58 3.87 - 5.11 MIL/uL   Hemoglobin 14.4 12.0 - 15.0 g/dL   HCT 04.5 40.9 - 81.1 %   MCV 88.9 78.0 - 100.0 fL   MCH 31.4 26.0 - 34.0 pg   MCHC 35.4 30.0 - 36.0 g/dL   RDW 91.4 78.2 - 95.6 %   Platelets 386 150 - 400 K/uL  Urinalysis,  Routine w reflex microscopic (not at [facility])  Result Value Ref Range   Color, Urine YELLOW YELLOW   APPearance HAZY (A) CLEAR   Specific Gravity, Urine 1.036 (H) 1.005 - 1.030   pH 6.0 5.0 - 8.0   Glucose, UA NEGATIVE NEGATIVE mg/dL   Hgb urine dipstick NEGATIVE NEGATIVE   Bilirubin Urine NEGATIVE NEGATIVE   Ketones, ur 15 (A) NEGATIVE mg/dL   Protein, ur NEGATIVE NEGATIVE mg/dL   Urobilinogen, UA 0.2 0.0 - 1.0 mg/dL   Nitrite NEGATIVE NEGATIVE   Leukocytes, UA SMALL (A) NEGATIVE  Urine microscopic-add on  Result Value Ref Range   Squamous Epithelial / LPF MANY (A) RARE   WBC, UA 11-20 <3 WBC/hpf   Bacteria, UA FEW (A) RARE   Urine-Other MUCOUS PRESENT   hCG, quantitative, pregnancy  Result Value Ref Range   hCG, Beta Chain, Quant, S <1 <5 mIU/mL     Imaging Review No results found. I have personally reviewed and evaluated these images and lab results as part of my medical decision-making.   EKG Interpretation None      MDM   Final diagnoses:  Left lower quadrant pain  Pregnancy    Patient seen and in July for similar complaint. No further vaginal bleeding. But persistent bilateral lower quadrant abdominal pain left side greater than right. Patient's quantitative hCG on July 28 was 372. It will be repeated today ultrasound repeated today. Ultrasound July 28 was most likely IUP but no definitive yolk sac. Patient in no acute  distress. Patient was told to have follow-up quantitative hCG and ultrasound 2 weeks from the [facility] visit [facility] however patient seen not understand.  Urinalysis today not consistent with urinary tract infection.  Based on findings from a month ago. Patient was either early IUP or was threatened miscarriage. Follow-up labs and ultrasound today will help determine witch.  Vanetta Mulders, MD 05/03/15 2243  Patient's quantitative hCG is now all essentially 0 patients no further pregnant most likely had a miscarriage since the last evaluation on  July 28. Formal ultrasound results are still pending unless they show something unusual patient can be discharged home with [facility] and/or GYN resource guide provided. Interpreter phones used to explain this to the patient. Overnight physician will review the ultrasound results.     Vanetta Mulders, MD 05/03/15 [phone]

## 2015-05-04 MED ORDER — ACETAMINOPHEN 325 MG PO TABS
650.0000 mg | ORAL_TABLET | Freq: Once | ORAL | Status: AC
  Administered 2015-05-04: 650 mg via ORAL
  Filled 2015-05-04: qty 2

## 2015-07-24 ENCOUNTER — Telehealth: Payer: Self-pay | Admitting: Internal Medicine

## 2015-07-24 NOTE — Telephone Encounter (Signed)
Patient called and requested a prescription for Ibuprofen. Please f/u

## 2015-07-25 ENCOUNTER — Encounter (HOSPITAL_COMMUNITY): Payer: Self-pay | Admitting: Emergency Medicine

## 2015-07-25 ENCOUNTER — Emergency Department (HOSPITAL_COMMUNITY)
Admission: EM | Admit: 2015-07-25 | Discharge: 2015-07-25 | Disposition: A | Discharge status: Home / Self Care | Payer: Self-pay | Attending: Emergency Medicine | Admitting: Emergency Medicine

## 2015-07-25 ENCOUNTER — Emergency Department (HOSPITAL_COMMUNITY): Payer: Self-pay

## 2015-07-25 DIAGNOSIS — R1011 Right upper quadrant pain: Secondary | ICD-10-CM

## 2015-07-25 DIAGNOSIS — I10 Essential (primary) hypertension: Secondary | ICD-10-CM | POA: Insufficient documentation

## 2015-07-25 DIAGNOSIS — K8070 Calculus of gallbladder and bile duct without cholecystitis without obstruction: Secondary | ICD-10-CM | POA: Insufficient documentation

## 2015-07-25 DIAGNOSIS — R630 Anorexia: Secondary | ICD-10-CM | POA: Insufficient documentation

## 2015-07-25 DIAGNOSIS — E119 Type 2 diabetes mellitus without complications: Secondary | ICD-10-CM | POA: Insufficient documentation

## 2015-07-25 DIAGNOSIS — Z8719 Personal history of other diseases of the digestive system: Secondary | ICD-10-CM | POA: Insufficient documentation

## 2015-07-25 LAB — URINE MICROSCOPIC-ADD ON

## 2015-07-25 LAB — CBC
HCT: 41.6 % (ref 36.0–46.0)
Hemoglobin: 14.2 g/dL (ref 12.0–15.0)
MCH: 30.5 pg (ref 26.0–34.0)
MCHC: 34.1 g/dL (ref 30.0–36.0)
MCV: 89.3 fL (ref 78.0–100.0)
Platelets: 356 10*3/uL (ref 150–400)
RBC: 4.66 MIL/uL (ref 3.87–5.11)
RDW: 12.6 % (ref 11.5–15.5)
WBC: 7 10*3/uL (ref 4.0–10.5)

## 2015-07-25 LAB — URINALYSIS, ROUTINE W REFLEX MICROSCOPIC
BILIRUBIN URINE: NEGATIVE
Glucose, UA: NEGATIVE mg/dL
HGB URINE DIPSTICK: NEGATIVE
Ketones, ur: 15 mg/dL — AB
Nitrite: NEGATIVE
PROTEIN: NEGATIVE mg/dL
SPECIFIC GRAVITY, URINE: 1.024 (ref 1.005–1.030)
Urobilinogen, UA: 0.2 mg/dL (ref 0.0–1.0)
pH: 5 (ref 5.0–8.0)

## 2015-07-25 LAB — COMPREHENSIVE METABOLIC PANEL
ALBUMIN: 4.1 g/dL (ref 3.5–5.0)
ALK PHOS: 81 U/L (ref 38–126)
ALT: 46 U/L (ref 14–54)
AST: 31 U/L (ref 15–41)
Anion gap: 11 (ref 5–15)
BILIRUBIN TOTAL: 0.5 mg/dL (ref 0.3–1.2)
BUN: 11 mg/dL (ref 6–20)
CALCIUM: 9.3 mg/dL (ref 8.9–10.3)
CO2: 23 mmol/L (ref 22–32)
Chloride: 104 mmol/L (ref 101–111)
Creatinine, Ser: 0.55 mg/dL (ref 0.44–1.00)
GFR calc Af Amer: >60 mL/min (ref >60)
GFR calc non Af Amer: >60 mL/min (ref >60)
GLUCOSE: 124 mg/dL — AB (ref 65–99)
Potassium: 4.1 mmol/L (ref 3.5–5.1)
Sodium: 138 mmol/L (ref 135–145)
TOTAL PROTEIN: 7.7 g/dL (ref 6.5–8.1)

## 2015-07-25 LAB — I-STAT BETA HCG BLOOD, ED (MC, WL, AP ONLY)

## 2015-07-25 LAB — LIPASE, BLOOD: Lipase: 22 U/L (ref 11–51)

## 2015-07-25 MED ORDER — ONDANSETRON HCL 4 MG PO TABS: 4.0000 mg | ORAL_TABLET | Freq: Four times a day (QID) | ORAL | Status: DC

## 2015-07-25 MED ORDER — OXYCODONE-ACETAMINOPHEN 5-325 MG PO TABS
1.0000 | ORAL_TABLET | Freq: Once | ORAL | Status: AC
  Administered 2015-07-25: 1 via ORAL
  Filled 2015-07-25: qty 1

## 2015-07-25 MED ORDER — KETOROLAC TROMETHAMINE 30 MG/ML IJ SOLN
30.0000 mg | Freq: Once | INTRAMUSCULAR | Status: AC
  Administered 2015-07-25: 30 mg via INTRAVENOUS
  Filled 2015-07-25: qty 1

## 2015-07-25 MED ORDER — ONDANSETRON HCL 4 MG/2ML IJ SOLN
4.0000 mg | Freq: Once | INTRAMUSCULAR | Status: AC
  Administered 2015-07-25: 4 mg via INTRAVENOUS
  Filled 2015-07-25: qty 2

## 2015-07-25 MED ORDER — OXYCODONE-ACETAMINOPHEN 5-325 MG PO TABS: 1.0000 | ORAL_TABLET | Freq: Four times a day (QID) | ORAL | Status: DC | PRN

## 2015-07-25 NOTE — ED Provider Notes (Signed)
CSN: [id number]     Arrival date & time 07/25/15  0919 History   First MD Initiated Contact with Patient 07/25/15 [phone]     Chief Complaint  Patient presents with  . Abdominal Pain     (Consider location/radiation/quality/duration/timing/severity/associated sxs/prior Treatment) Patient is a 29 y.o. female presenting with abdominal pain. The history is provided by the patient and medical records. The history is limited by a language barrier. No language interpreter was used.  Abdominal Pain Associated symptoms: nausea and vomiting   Associated symptoms: no chills, no constipation, no cough, no diarrhea, no fever, no hematuria, no shortness of breath and no sore throat   JOSCELINE CHENARD is a 29 y.o. female  with a PMH of HTN, DM, gallstones presents to the Emergency Department complaining of worsening intermittent crampy RUQ abdominal pain x 4-5 days. Patient states pain radiates down right leg intermittently. Tried percocet she had at home which provided relief initially, but did not yesterday or today. No other alleviating or aggravating factors noted. Patient denies change with food, but states since the pain began she has only eaten salad and yogurt.    Past Medical History  Diagnosis Date  . Gallstone   . Pregnant    History reviewed. No pertinent past surgical history. Family History  Problem Relation Age of Onset  . Diabetes Mother   . Cancer Mother   . Heart disease Father   . Hypertension Father    Social History  Substance Use Topics  . Smoking status: Never Smoker   . Smokeless tobacco: None  . Alcohol Use: No   OB History    Gravida Para Term Preterm AB TAB SAB Ectopic Multiple Living   1              Review of Systems  Constitutional: Positive for appetite change. Negative for fever, chills, diaphoresis and activity change.  HENT: Negative for congestion, rhinorrhea and sore throat.   Eyes: Negative for visual disturbance.  Respiratory: Negative for cough,  shortness of breath and wheezing.   Cardiovascular: Negative.   Gastrointestinal: Positive for nausea, vomiting and abdominal pain. Negative for diarrhea and constipation.  Genitourinary: Negative for hematuria and pelvic pain.  Musculoskeletal: Negative for myalgias, back pain, arthralgias and neck pain.  Skin: Negative for rash.  Neurological: Negative for dizziness, weakness and headaches.      Allergies  Review of patient's allergies indicates no known allergies.  Home Medications   Prior to Admission medications   Medication Sig Start Date End Date Taking? Authorizing Provider  ACTIGALL 300 MG capsule Take 1 capsule (300 mg total) by mouth 2 (two) times daily. Patient not taking: Reported on 11/19/2014 01/10/14   Harriette Bouillon, MD  cephALEXin (KEFLEX) 500 MG capsule Take 1 capsule (500 mg total) by mouth 4 (four) times daily. Patient not taking: Reported on 07/25/2015 04/13/15   Joycie Peek, PA-C  gemfibrozil (LOPID) 600 MG tablet Take 1 tablet (600 mg total) by mouth AC breakfast. Patient not taking: Reported on 11/19/2014 12/08/13   Richarda Overlie, MD  ondansetron (ZOFRAN) 4 MG tablet Take 1 tablet (4 mg total) by mouth every 6 (six) hours. 07/25/15   Chase Picket Ward, PA-C  oxyCODONE-acetaminophen (PERCOCET/ROXICET) 5-325 MG tablet Take 1 tablet by mouth every 6 (six) hours as needed for severe pain. 07/25/15   Jaime Pilcher Ward, PA-C   BP 136/96 mmHg  Pulse 79  SpO2 100%  LMP 06/27/2015  Breastfeeding? Unknown Physical Exam  Constitutional: She is oriented  to person, place, and time. She appears well-developed and well-nourished.  Appears in pain but in no acute distress  HENT:  Head: Normocephalic and atraumatic.  Cardiovascular: Normal rate, regular rhythm, normal heart sounds and intact distal pulses.  Exam reveals no gallop and no friction rub.   No murmur heard. Pulmonary/Chest: Effort normal and breath sounds normal. No respiratory distress. She has no wheezes. She  has no rales. She exhibits no tenderness.  Abdominal: She exhibits no mass. There is no rebound and no guarding.  Abdomen soft, non-distended Tender to palpation of RUQ (+) Murphy's Bowel sounds positive in all four quadrants   Musculoskeletal: She exhibits no edema.  Neurological: She is alert and oriented to person, place, and time.  Skin: Skin is warm and dry. No rash noted.  Psychiatric: She has a normal mood and affect. Her behavior is normal. Judgment and thought content normal.  Nursing note and vitals reviewed.   ED Course  Procedures (including critical care time) Labs Review Labs Reviewed  COMPREHENSIVE METABOLIC PANEL - Abnormal; Notable for the following:    Glucose, Bld 124 (*)    All other components within normal limits  URINALYSIS, ROUTINE W REFLEX MICROSCOPIC (NOT AT [facility]) - Abnormal; Notable for the following:    APPearance HAZY (*)    Ketones, ur 15 (*)    Leukocytes, UA SMALL (*)    All other components within normal limits  URINE MICROSCOPIC-ADD ON - Abnormal; Notable for the following:    Squamous Epithelial / LPF MANY (*)    Bacteria, UA MANY (*)    All other components within normal limits  LIPASE, BLOOD  CBC  I-STAT BETA HCG BLOOD, ED ([facility], [facility], [facility] ONLY)    Imaging Review [place] Abdomen Limited  07/25/2015  CLINICAL DATA:  29 year old female with right upper quadrant pain for 4 days. Cholelithiasis. Initial encounter. EXAM: US ABDOMEN LIMITED - RIGHT UPPER QUADRANT COMPARISON:  CT Abdomen and Pelvis 01/05/2014 FINDINGS: Gallbladder: The gallbladder appears filled with a combination of sludge and gallstones. Shadowing stones individually measure up to about 11 mm. Still, gallbladder wall thickness remains normal at 2 mm. No pericholecystic fluid. No sonographic Murphy sign elicited. Common bile duct: Diameter: 5-6 mm, within normal limits. Liver: Echogenic. No intrahepatic biliary ductal dilatation or discrete liver lesion. Other findings: Negative visible right  kidney. IMPRESSION: 1. Cholelithiasis without strong evidence of acute cholecystitis or biliary obstruction. 2. Fatty liver disease. Electronically Signed   By: Odessa FlemingH  Hall M.D.   On: 07/25/2015 11:18   I have personally reviewed and evaluated these images and lab results as part of my medical decision-making.   EKG Interpretation None      MDM   Final diagnoses:  Calculus of gallbladder and bile duct without cholecystitis or obstruction   Debby Freiberguth N Cortez[place]Pena presents with RUQ pain which she has struggled with chronically. No fever, vitals wdl  Labs: hcg, lipase, cbc wdl; CMP reassuring - glucose 124 U/S revealed cholelithiasis without strong evidence of acute cholecystitis or biliary obstruction and fatty liver disease  A/P: Likely symptomatic cholelithiasis. Patient states she was referred for surgery in the past but did not have the financial means to proceed. Will discharge home with pain and nausea control with strict follow-up precautions for PCP follow-up and reasons to return to ED.   Patient seen by and discussed with Dr. Effie ShyWentz who agrees with treatment plan.   Filed Vitals:   07/25/15 1230  BP: 136/96  Pulse: 79  [facility] Ward, PA-C 07/25/15 1248  Mancel Bale, MD 07/25/15 [phone]

## 2015-07-25 NOTE — ED Notes (Signed)
Pt reports R upper abdominal pain with nvd since Friday.

## 2015-07-25 NOTE — ED Notes (Signed)
Pt also c/o headache.  ?

## 2015-07-25 NOTE — Telephone Encounter (Signed)
Medical Assistant used [employer] to contact patient.  Interpreter Name: Vedia CofferSaharah Interpreter #: [phone]  Patient verified DOB Patient informed of picking up ibuprofen from any pharmacy OTC. Patient states she received a prescription for Oxycodone from the ER. Patient had no further questions.

## 2015-07-25 NOTE — Discharge Instructions (Signed)
1. Medications: Percocet for pain as needed - do not drink or drive when using this medication; zofran as needed for nausea; continue usual home medications 2. Treatment: rest, drink plenty of fluids 3. Follow Up: Please follow up with your primary doctor in 3 days for discussion of your diagnoses and further evaluation after today's visit; Please see surgeon as quickly as possible to discuss your diagnosis as well. Please return to the ER for new or worsening pain or any additional concerns.    Clico biliar (Biliary Colic) El clico biliar es un dolor en la regin superior del abdomen. El dolor:  Generalmente, se siente en la regin superior derecha del abdomen, pero tambin puede aparecer en la regin central, justo debajo del esternn.  Puede irradiarse a la espalda, hacia el omplato derecho.  Puede ser constante o discontinuo.  Puede estar acompaado de nuseas y vmitos. La mayor parte del tiempo, desaparece en el trmino de 1 a 5horas. Una vez que el dolor intenso desaparece, puede haber dolor abdominal leve que se prolonga durante aproximadamente 24horas. La causa del clico biliar es una obstruccin en las vas biliares. Las vas biliares son conductos que transportan bilis, un lquido que ayuda a Location managerdigerir las [place]grasas, desde la vescula biliar hasta el intestino delgado. Generalmente, el clico biliar ocurre despus de comer, cuando el sistema digestivo necesita bilis. El dolor aparece cuando las clulas musculares se contraen bruscamente para intentar mover la obstruccin, para que la bilis pueda pasar. INSTRUCCIONES PARA EL CUIDADO EN EL HOGAR  Tome los medicamentos solamente como se lo haya indicado el mdico.  Beba suficiente lquido para Pharmacologistmantener la orina clara o de color amarillo plido.  Evite los alimentos grasos y fritos. Este tipo de alimentos aumentan la demanda de bilis del organismo.  Evite los alimentos que Dispensing opticianintensifican el dolor.  No coma en exceso.  No ingiera  una comida abundante despus de ayunar. SOLICITE ATENCIN MDICA SI:  Lance Mussiene fiebre.  El dolor [place].  Vomita.  Tiene nuseas que le impiden la ingesta de comidas y bebidas. SOLICITE ATENCIN MDICA DE INMEDIATO SI:  Maxie Barbe manera repentina, tiene Grant Rutsfiebre y escalofros.  Observa una coloracin amarillenta (ictericia) de:  La piel.  Las partes blancas de los ojos.  Las Applied Materialsmembranas mucosas.  Siente dolor continuo o intenso que no se alivia con los medicamentos.  Tiene nuseas y vmitos que no se alivian con los United Parcelmedicamentos.  Tiene mareos o se desmaya.   Esta informacin no tiene Theme park managercomo fin reemplazar el consejo del mdico. Asegrese de hacerle al mdico cualquier pregunta que tenga.   Document Released: 12/10/2007 Document Revised: 01/17/2015 Elsevier Interactive Patient Education [employer]2016 Elsevier Inc.

## 2015-07-25 NOTE — ED Provider Notes (Signed)
  Face-to-face evaluation   History: Pain for 4 days. History of same. Out of narcotics at this time. Known gallbladder problems for several years, has seen surgery, but not been able to afford the down payment, for the surgery. She is [facility] for ongoing management.  Physical exam: Obese, alert, cooperative. Abdomen soft, mild motor quadrant tenderness. Patient is nontoxic.  Medical screening examination/treatment/procedure(s) were conducted as a shared visit with non-physician practitioner(s) and myself.  I personally evaluated the patient during the encounter  Mancel BaleElliott Makelle Marrone, MD 07/25/15 1857

## 2015-08-24 ENCOUNTER — Encounter: Payer: Self-pay | Admitting: Internal Medicine

## 2015-08-24 ENCOUNTER — Ambulatory Visit: Payer: Self-pay | Attending: Internal Medicine | Admitting: Internal Medicine

## 2015-08-24 VITALS — BP 109/74 | HR 69 | Temp 98.2°F | Resp 18 | Ht 62.0 in | Wt 216.6 lb

## 2015-08-24 DIAGNOSIS — K8070 Calculus of gallbladder and bile duct without cholecystitis without obstruction: Secondary | ICD-10-CM

## 2015-08-24 DIAGNOSIS — R739 Hyperglycemia, unspecified: Secondary | ICD-10-CM

## 2015-08-24 DIAGNOSIS — E781 Pure hyperglyceridemia: Secondary | ICD-10-CM

## 2015-08-24 DIAGNOSIS — R7303 Prediabetes: Secondary | ICD-10-CM | POA: Insufficient documentation

## 2015-08-24 DIAGNOSIS — R1011 Right upper quadrant pain: Secondary | ICD-10-CM | POA: Insufficient documentation

## 2015-08-24 LAB — POCT GLYCOSYLATED HEMOGLOBIN (HGB A1C): Hemoglobin A1C: 5.7

## 2015-08-24 LAB — GLUCOSE, POCT (MANUAL RESULT ENTRY): POC GLUCOSE: 105 mg/dL — AB (ref 70–99)

## 2015-08-24 MED ORDER — GEMFIBROZIL 600 MG PO TABS: 600.0000 mg | ORAL_TABLET | Freq: Two times a day (BID) | ORAL | Status: DC

## 2015-08-24 MED ORDER — OMEPRAZOLE 20 MG PO CPDR: 20.0000 mg | DELAYED_RELEASE_CAPSULE | Freq: Every day | ORAL | Status: DC

## 2015-08-24 MED ORDER — ACTIGALL 300 MG PO CAPS: 300.0000 mg | ORAL_CAPSULE | Freq: Two times a day (BID) | ORAL | Status: DC

## 2015-08-24 NOTE — Progress Notes (Signed)
Patient ID: Shawna [name], female   DOB: 29-Apr-1986, 29 y.o.   MRN: [id number]   Shawna [name], is a 29 y.o. female  [id number]  [id number]  DOB - 29-Apr-1986  Chief Complaint  Patient presents with  . Follow-up        Subjective:   Shawna [name] is a 29 y.o. female Spanish speaking with history of hypertension, prediabetes, gallstone and hypertriglyceridemia here today for a follow up visit. She was recently seen in the ED for worsening right upper quadrant abdominal pain likely from her gallstone. Patient had been referred to surgery in the past but did not have the financial means to proceed with surgery. Patient has been started on ACTIGALL 300 MG capsule but she is yet to pick up the prescription. She still has occasional pain but much better. While in the ED she was noticed to have hyperglycemia, she was told to follow-up for that as well. She has no personal history of diabetes mellitus but has been diagnosed with prediabetes in the past. Patient has No headache, No chest pain, No new weakness tingling or numbness, No Cough - SOB.  Problem  Calculus of Gallbladder and Bile Duct Without Cholecystitis Or Obstruction    ALLERGIES: No Known Allergies  PAST MEDICAL HISTORY: Past Medical History  Diagnosis Date  . Gallstone   . Pregnant     MEDICATIONS AT HOME: Prior to Admission medications   Medication Sig Start Date End Date Taking? Authorizing Provider  ondansetron (ZOFRAN) 4 MG tablet Take 1 tablet (4 mg total) by mouth every 6 (six) hours. 07/25/15  Yes Jaime Pilcher Ward, PA-C  oxyCODONE-acetaminophen (PERCOCET/ROXICET) 5-325 MG tablet Take 1 tablet by mouth every 6 (six) hours as needed for severe pain. 07/25/15  Yes Jaime Pilcher Ward, PA-C  ACTIGALL 300 MG capsule Take 1 capsule (300 mg total) by mouth 2 (two) times daily. 08/24/15   Quentin Angstlugbemiga E Anvitha Hutmacher, MD  cephALEXin (KEFLEX) 500 MG capsule Take 1 capsule (500 mg total) by mouth 4 (four) times  daily. Patient not taking: Reported on 07/25/2015 04/13/15   Joycie PeekBenjamin Cartner, PA-C  gemfibrozil (LOPID) 600 MG tablet Take 1 tablet (600 mg total) by mouth 2 (two) times daily before a meal. 08/24/15   Quentin Angstlugbemiga E Mcgregor Tinnon, MD  omeprazole (PRILOSEC) 20 MG capsule Take 1 capsule (20 mg total) by mouth daily. 08/24/15   Quentin Angstlugbemiga E Luke Rigsbee, MD     Objective:   Filed Vitals:   08/24/15 1646  BP: 109/74  Pulse: 69  Temp: 98.2 F (36.8 C)  TempSrc: Oral  Resp: 18  Height: 5\' 2"  (1.575 m)  Weight: 216 lb 9.6 oz (98.249 kg)  SpO2: 98%    Exam General appearance : Awake, alert, not in any distress. Speech Clear. Not toxic looking, obese HEENT: Atraumatic and Normocephalic, pupils equally reactive to light and accomodation Neck: supple, no JVD. No cervical lymphadenopathy.  Chest:Good air entry bilaterally, no added sounds  CVS: S1 S2 regular, no murmurs.  Abdomen: Bowel sounds present, Non tender and not distended with no gaurding, rigidity or rebound. Extremities: B/L Lower Ext shows no edema, both legs are warm to touch Neurology: Awake alert, and oriented X 3, CN II-XII intact, Non focal Skin:No Rash  Data Review Lab Results  Component Value Date   HGBA1C 5.70 08/24/2015   HGBA1C 5.7 07/12/2014   HGBA1C 5.9 01/03/2014     Assessment & Plan   1. Hyperglycemia  - POCT A1C is 5.7% today - Glucose (CBG)  2.  Hypertriglyceridemia  - gemfibrozil (LOPID) 600 MG tablet; Take 1 tablet (600 mg total) by mouth 2 (two) times daily before a meal.  Dispense: 60 tablet; Refill: 5 - omeprazole (PRILOSEC) 20 MG capsule; Take 1 capsule (20 mg total) by mouth daily.  Dispense: 30 capsule; Refill: 3 - Lipid panel  To address this please limit saturated fat to no more than 7% of your calories, limit cholesterol to 200 mg/day, increase fiber and exercise as tolerated. If needed we may add another cholesterol lowering medication to your regimen.   3. Calculus of gallbladder and bile duct  without cholecystitis or obstruction  - ACTIGALL 300 MG capsule; Take 1 capsule (300 mg total) by mouth 2 (two) times daily.  Dispense: 60 capsule; Refill: 6  Patient have been counseled extensively about nutrition and exercise  Interpreter was used to communicate directly with patient for the entire encounter including providing detailed patient instructions.  Return in about 6 months (around 02/22/2016) for Follow up Pain and comorbidities, cholelithiasis.  The patient was given clear instructions to go to ER or return to medical center if symptoms don't improve, worsen or new problems develop. The patient verbalized understanding. The patient was told to call to get lab results if they haven't heard anything in the next week.   This note has been created with Education officer, environmental. Any transcriptional errors are unintentional.    Jeanann Lewandowsky, MD, MHA, Maxwell Caul, CPE [facility] and [facility] [place], [place] [phone]   08/24/2015, 5:43 PM

## 2015-08-24 NOTE — Patient Instructions (Signed)
Colelitiasis (Cholelithiasis) La colelitiasis (tambin llamada clculos en la vescula) es una enfermedad en la que se forman piedras en la vescula. La vescula es un rgano que almacena la bilis que se forma en el hgado y que ayuda a Engineer, agricultural. Los clculos comienzan como pequeos cristales y lentamente se transforman en piedras. El dolor en la vescula ocurre cuando se producen espasmos y los clculos obstruyen el conducto. El dolor tambin se produce cuando una piedra sale por el conducto.  FACTORES DE RIESGO  Ser mujer.   Tener embarazos mltiples. Algunas veces los mdicos aconsejan extirpar los clculos biliares antes de futuros embarazos.   Ser obeso.  Dietas que incluyan comidas fritas y grasas.   Ser mayor de 72 aos y el aumento de la edad.   El uso prolongado de medicamentos que contengan hormonas femeninas.   Tener diabetes mellitus.   Prdida rpida de peso.   Historia familiar de clculos (herencia).  SNTOMAS  Nuseas.   Vmitos.  Dolor abdominal.   Piel amarilla (ictericia)   Dolor sbito. Puede persistir desde algunos minutos hasta algunas horas.  Grant Ruts.   Sensibilidad al tacto. En algunos casos, cuando los clculos biliares no se mueven hacia el conducto biliar, las personas no sienten dolor ni presentan sntomas. Estos se denominan clculos "silenciosos".  TRATAMIENTO Los clculos silenciosos no requieren TEFL teacher. En los [employer], podr ser [place]. Las opciones de tratamiento son:  Kandis Ban para extirpar la vescula. Es el tratamiento ms frecuente.  Medicamentos. No siempre dan resultado y pueden demorar entre 6 y 12 meses o ms en Scientist, water quality.  Tratamiento con ondas de choque (litotricia biliar extracorporal). En este tratamiento, una mquina de ultrasonido enva ondas de choque a la vescula para destruir los clculos en pequeos fragmentos que luego podrn pasar a los intestinos o ser disueltas  con medicamentos. INSTRUCCIONES PARA EL CUIDADO EN EL HOGAR   Slo tome medicamentos de venta libre o recetados para Primary school teacher, Environmental health practitioner o bajar la fiebre, segn las indicaciones de su mdico.   Siga una dieta baja en grasas hasta que su mdico lo vea nuevamente. Las grasas hacen que la vescula se Technical sales engineer, lo que puede Engineer, agricultural.   Concurra a las consultas de control con su mdico segn las indicaciones. Los ataques casi siempre son recurrentes y generalmente habr que someterse a una ciruga como [place].  SOLICITE ATENCIN MDICA DE INMEDIATO SI:   El dolor aumenta y no puede controlarlo con los medicamentos.   Tiene fiebre o sntomas persistentes durante ms de 2 - 3 das.   Tiene fiebre y los sntomas empeoran repentinamente.   Tiene nuseas o vmitos persistentes.  ASEGRESE DE QUE:   Comprende estas instrucciones.  Controlar su afeccin.  Recibir ayuda de inmediato si no mejora o si empeora.   Esta informacin no tiene Theme park manager el consejo del mdico. Asegrese de hacerle al mdico cualquier pregunta que tenga.   Document Released: 06/19/2006 Document Revised: 05/05/2013 Elsevier Interactive Patient Education 2016 [employer]. Opciones de alimentos para Copy nivel de triglicridos (Food Choices to Lower Your Triglycerides) Los triglicridos son un tipo de grasas que se Hotel manager. Un nivel elevado de triglicridos puede aumentar el riesgo de padecer enfermedades cardacas e infartos. Si sus niveles de triglicridos son altos, los alimentos que se ingieren y los hbitos de alimentacin son Engineer, production. Elegir los alimentos adecuados puede ayudar a Stage manager de triglicridos.  QU PAUTAS  GENERALES DEBO SEGUIR?  Baje de peso si es necesario.  Limite o evite el alcohol.  Llene la mitad del plato con vegetales y ensaladas de hojas verdes.  Limite las frutas a dos porciones por da. Elija frutas en  lugar de jugos.  Ocupe un cuarto del plato con cereales integrales. Busque la palabra "integral" en Estate agent de la lista de ingredientes.  Llene un cuarto del plato con alimentos con protenas magras.  Disfrute de pescados grasos (como salmn, caballa, sardinas y atn) tres veces por semana.  Elija las grasas saludables.  Limite los alimentos con alto contenido de almidn y International aid/development worker.  Consuma ms comida casera y menos de restaurante, de buf y comida rpida.  Limite el consumo de alimentos fritos.  Cocine los alimentos utilizando mtodos que no sean la fritura.  Limite el consumo de grasas saturadas.  Verifique las listas de ingredientes para evitar alimentos con aceites parcialmente hidrogenados (grasas trans). QU ALIMENTOS PUEDO COMER?  Cereales Cereales integrales, como los panes de salvado o [place], las [place], los cereales y las pastas. Avena sin endulzar, trigo, [place], quinua o arroz integral. Tortillas de harina de maz o de salvado.  Vegetales Verduras frescas o congeladas (crudas, al vapor, asadas o grilladas). Ensaladas de hojas verdes. Fruits Frutas frescas, en conserva (en su jugo natural) o frutas congeladas. Carnes y otros productos con protenas Carne de res molida (al 85% o ms [place]), carne de res de animales alimentados con pastos o carne de res sin la grasa. Pollo o pavo sin piel. Carne de pollo o de [place]. Cerdo sin la grasa. Todos los pescados y frutos de mar. Huevos. Porotos, guisantes o lentejas secos. Frutos secos o semillas sin sal. Frijoles secos o en lata sin sal. Lcteos Productos lcteos con bajo contenido de grasas, como [place] o al 1%, quesos reducidos en grasas o al 2%, ricota con bajo contenido de grasas o [employer], o yogur natural con bajo contenido de [place]. Grasas y Writer en barra que no contengan grasas trans. [place] y condimentos para ensaladas livianos o reducidos en grasas. [place]. Aceites  de crtamo, oliva o canola. Mantequilla natural de man o almendra. Los artculos mencionados arriba pueden no ser [employer] de las bebidas o los alimentos recomendados. Comunquese con el nutricionista para conocer ms opciones. QU ALIMENTOS NO SE RECOMIENDAN?  Cereales Pan blanco. Pastas blancas. Arroz blanco. Pan de maz. Bagels, pasteles y croissants. Galletas saladas que contengan grasas trans. Vegetales Papas blancas. Maz. Vegetales con crema o fritos. Verduras en salsa de [place]. Fruits Frutas secas. Fruta enlatada en almbar liviano o espeso. Jugo de frutas. Carnes y otros productos con protenas Cortes de carne con Holiday representative. Costillas, alas de pollo, tocineta, salchicha, mortadela, salame, chinchulines, tocino, perros calientes, salchichas alemanas y embutidos envasados. Lcteos Leche entera o al 2%, crema, mezcla de [place] y crema y queso crema. Yogur entero o endulzado. Quesos con toda su grasa. Cremas no lcteas y coberturas batidas. Quesos procesados, quesos para untar o cuajadas. Dulces y postres Jarabe de maz, azcares, miel y Radio broadcast assistant. Caramelos. Mermelada y [place]. Doreen Beam. Cereales endulzados. Galletas, pasteles, bizcochuelos, donas, muffins y helado. Grasas y [address], [place] en barra, [place] de [place], [place], [place] clarificada o grasa de tocino. Aceites de coco, de palmiste o de palma. Bebidas Alcohol. Bebidas endulzadas (como refrescos, limonadas y bebidas frutales o ponches). Los artculos mencionados arriba pueden no ser [employer] de las bebidas y los alimentos que se Theatre stage manager. Comunquese  con el nutricionista para recibir ms informacin.   Esta informacin no tiene Theme park managercomo fin reemplazar el consejo del mdico. Asegrese de hacerle al mdico cualquier pregunta que tenga.   Document Released: 02/20/2010 Document Revised: 09/07/2013 Elsevier Interactive Patient Education [employer]2016 Elsevier Inc.

## 2015-08-24 NOTE — Progress Notes (Signed)
Patient here for Hyperglycemia FU  Patient denies pain at this time.

## 2015-08-25 LAB — LIPID PANEL
CHOL/HDL RATIO: 7.4 ratio — AB (ref <=5.0)
Cholesterol: 178 mg/dL (ref 125–200)
HDL: 24 mg/dL — ABNORMAL LOW (ref >=46)
LDL CALC: 79 mg/dL (ref <130)
Triglycerides: 373 mg/dL — ABNORMAL HIGH (ref <150)
VLDL: 75 mg/dL — AB (ref <30)

## 2015-09-04 ENCOUNTER — Telehealth: Payer: Self-pay | Admitting: *Deleted

## 2015-09-04 DIAGNOSIS — E781 Pure hyperglyceridemia: Secondary | ICD-10-CM

## 2015-09-04 NOTE — Telephone Encounter (Signed)
Patient returned phone call, please f/u °

## 2015-09-04 NOTE — Telephone Encounter (Signed)
Medical Assistant used [employer] to contact patient.  Interpreter Name: Johnathan Interpreter #: [id number] Medical Assistant left message on patient's home and cell voicemail. Voicemail states to give a call back to Nubia with [facility] at [phone].   

## 2015-09-04 NOTE — Telephone Encounter (Signed)
-----   Message from Quentin Angstlugbemiga E Jegede, MD sent at 08/25/2015 12:08 PM EST ----- Please inform patient that her laboratory test that shows high triglyceride. Continue gemfibrozil as prescribed, and also to address this please limit saturated fat to no more than 7% of your calories, limit cholesterol to 200 mg/day, increase fiber and exercise as tolerated. If needed we may add another cholesterol lowering medication to your regimen.

## 2015-09-05 NOTE — Telephone Encounter (Signed)
Pt. Returned call. Please f/u with pt. °

## 2015-09-08 MED ORDER — GEMFIBROZIL 600 MG PO TABS: 600.0000 mg | ORAL_TABLET | Freq: Two times a day (BID) | ORAL | Status: DC

## 2015-09-08 MED ORDER — OMEPRAZOLE 20 MG PO CPDR: 20.0000 mg | DELAYED_RELEASE_CAPSULE | Freq: Every day | ORAL | Status: DC

## 2015-09-08 NOTE — Telephone Encounter (Signed)
Medical Assistant used [employer] to contact patient.  Interpreter Name: Kennith GainSaul Interpreter #: [phone]  Patient verified DOB Patinent informed of Triglycerdides being high. Patient advised to continue with gemfibrozil and to limit her saturated fats and cholesterol intake. Patient advised of second cholesterol medication being added if her levels do not come down. [facility] Patient advised of medical assistant [facility] her [facility]. Patient had no further questions and expressed her understanding.

## 2015-09-17 NOTE — L&D Delivery Note (Signed)
Delivery Note At 4:46 PM a viable female was delivered via Vaginal, Spontaneous Delivery (Presentation OA: ;  ).  APGAR: 9, 9; weight 7 lb 10.9 oz .   Placenta status [facility] presentation with gentle traction.  3 vc.   Anesthesia:   Episiotomy: None Lacerations: 1st degree periurethal hemostatic, not repaired.  Est. Blood Loss (mL): 200  Mom to postpartum.  Baby to Couplet care / Skin to Skin.  Charlesetta GaribaldiKathryn Lorraine Kooistra CNM 06/25/2016, 6:52 PM  Midwife attestation: I was gloved and present for delivery in its entirety and I agree with the above CNM's note.  Donette LarryMelanie Maron Stanzione, CNM 6:57 PM

## 2015-11-22 ENCOUNTER — Telehealth: Payer: Self-pay | Admitting: Internal Medicine

## 2015-11-22 NOTE — Telephone Encounter (Signed)
Patient called and stated that she's pregnant. Patient would like to know if she can continue taking her cholesterol medicine. Please follow up.

## 2015-11-30 NOTE — Telephone Encounter (Signed)
Medical Assistant used [employer] to contact patient.  Interpreter Name:Jose Interpreter #: [phone] Medical Assistant left message on patient's home and cell voicemail. Voicemail states to give a call back to [place] [facility] at [phone].

## 2015-12-07 ENCOUNTER — Ambulatory Visit: Payer: Self-pay | Attending: Internal Medicine | Admitting: Physician Assistant

## 2015-12-07 ENCOUNTER — Encounter: Payer: Self-pay | Admitting: Physician Assistant

## 2015-12-07 VITALS — BP 117/76 | HR 80 | Temp 98.0°F | Resp 16 | Ht 62.0 in | Wt 210.0 lb

## 2015-12-07 DIAGNOSIS — J40 Bronchitis, not specified as acute or chronic: Secondary | ICD-10-CM

## 2015-12-07 DIAGNOSIS — O0001 Abdominal pregnancy with intrauterine pregnancy: Secondary | ICD-10-CM

## 2015-12-07 DIAGNOSIS — N926 Irregular menstruation, unspecified: Secondary | ICD-10-CM

## 2015-12-07 LAB — POCT URINE PREGNANCY: PREG TEST UR: POSITIVE — AB

## 2015-12-07 MED ORDER — AZITHROMYCIN 250 MG PO TABS: ORAL_TABLET | ORAL | Status: DC

## 2015-12-07 NOTE — Progress Notes (Signed)
Patient here for follow up Patient states she did a home pregnancy test in January and it Came back positive Patient has since stopped taking her gemfibrozil Patient has not yet follow up with ob-gyn

## 2015-12-07 NOTE — Progress Notes (Signed)
Patient ID: Shawna [name], female   DOB: [birth year], 30 y.o.   MRN: [id number]   Shawna [name], is a 30 y.o. female  WUJ:[id number]  [id number]  DOB - Nov 27, 1985  Chief Complaint  Patient presents with  . Follow-up        Subjective:   Shawna [name] is a 30 y.o. female here today to discuss +pregnancy test and her stopping the gemfibrozil and actigall that she was started on previously.  She took a home pregnancy test in January and stopped all her meds at that time.  She has an ob/gyn in town that she has used previously, but she hasn't scheduled an appointment yet.  She doesn't know when her last period was-she thinks it was bt Oct and December.  Her periods are very irregular.  She denies any N/V, cramping, or bleeding.  Her pregnancy seems to be progressing as previous pregnancies have.  Today she complains of a cough X 2weeks.  She c/o subjective fever the first few days of the cough.  She is coughing up yellow phlegm.   ROS: Patient has No headache, No chest pain, No abdominal pain - No Nausea, No new weakness tingling or numbness, No SOB.  Spanish speaking virtual interpreter used.  ALLERGIES: No Known Allergies  PAST MEDICAL HISTORY: Past Medical History  Diagnosis Date  . Gallstone   . Pregnant     MEDICATIONS AT HOME: Prior to Admission medications   Medication Sig Start Date End Date Taking? Authorizing Provider  azithromycin (ZITHROMAX) 250 MG tablet Take 2 today, then 1 daily days 2-5 12/07/15   Anders Simmonds, PA-C  omeprazole (PRILOSEC) 20 MG capsule Take 1 capsule (20 mg total) by mouth daily. 09/08/15   Quentin Angst, MD     Objective:   Filed Vitals:   12/07/15 1456  BP: 117/76  Pulse: 80  Temp: 98 F (36.7 C)  Resp: 16  Height:  (1.575 m)  Weight: 210 lb (95.255 kg)  SpO2: 100%    Exam General appearance : Awake, alert, not in any distress. Speech Clear. Not toxic looking HEENT: Atraumatic and Normocephalic, pupils  equally reactive to light and accomodation Neck: supple, no JVD. No cervical lymphadenopathy.  Chest:Good air entry bilaterally, some scattered mild rhochi, no rales or wheezing CVS: S1 S2 regular, no murmurs.  Extremities: B/L Lower Ext shows no edema, both legs are warm to touch Neurology: Awake alert, and oriented X 3, CN II-XII intact, Non focal Skin:No Rash  Data Review Lab Results  Component Value Date   HGBA1C 5.70 08/24/2015   HGBA1C 5.7 07/12/2014   HGBA1C 5.9 01/03/2014     Assessment & Plan   1. Bronchitis zpack  2. Missed periods + pregnancy.  We gave her multivitamin samples that we had in stock for women.  1 daily - POCT urine pregnancy  3. Abdominal pregnancy with intrauterine pregnancy Schedule appt with OB/gyn.  She will call and schedule as she is already an established patient.    4.  Hypertriglyceridemia and asymptomatic cholelithiasis-ok to stay off gemfibrozil and actigall   Patient have been counseled extensively about nutrition and exercise  Return in about 6 months (around 06/08/2016) for f/up on cholesterol/triglycerides.  The patient was given clear instructions to go to ER or return to medical center if symptoms don't improve, worsen or new problems develop. The patient verbalized understanding. The patient was told to call to get lab results if they haven't heard anything in the next week.  Georgian CoAngela Wilmar Prabhakar, PA-C [facility] [place], [place] [phone]   12/07/2015, 3:40 PM

## 2015-12-07 NOTE — Patient Instructions (Signed)
Make appointment with the OB/gyn office.

## 2015-12-25 ENCOUNTER — Inpatient Hospital Stay (HOSPITAL_COMMUNITY): Payer: Self-pay

## 2015-12-25 ENCOUNTER — Encounter (HOSPITAL_COMMUNITY): Payer: Self-pay | Admitting: *Deleted

## 2015-12-25 ENCOUNTER — Inpatient Hospital Stay (HOSPITAL_COMMUNITY)
Admission: AD | Admit: 2015-12-25 | Discharge: 2015-12-25 | Disposition: A | Discharge status: Home / Self Care | Payer: Self-pay | Source: Ambulatory Visit | Attending: Obstetrics & Gynecology | Admitting: Obstetrics & Gynecology

## 2015-12-25 DIAGNOSIS — O26899 Other specified pregnancy related conditions, unspecified trimester: Secondary | ICD-10-CM

## 2015-12-25 DIAGNOSIS — O26892 Other specified pregnancy related conditions, second trimester: Secondary | ICD-10-CM | POA: Insufficient documentation

## 2015-12-25 DIAGNOSIS — Z3492 Encounter for supervision of normal pregnancy, unspecified, second trimester: Secondary | ICD-10-CM

## 2015-12-25 DIAGNOSIS — Z3A14 14 weeks gestation of pregnancy: Secondary | ICD-10-CM | POA: Insufficient documentation

## 2015-12-25 DIAGNOSIS — R1032 Left lower quadrant pain: Secondary | ICD-10-CM | POA: Insufficient documentation

## 2015-12-25 DIAGNOSIS — R109 Unspecified abdominal pain: Secondary | ICD-10-CM

## 2015-12-25 DIAGNOSIS — O9989 Other specified diseases and conditions complicating pregnancy, childbirth and the puerperium: Secondary | ICD-10-CM

## 2015-12-25 LAB — URINALYSIS, ROUTINE W REFLEX MICROSCOPIC
BILIRUBIN URINE: NEGATIVE
Glucose, UA: NEGATIVE mg/dL
Hgb urine dipstick: NEGATIVE
KETONES UR: NEGATIVE mg/dL
Leukocytes, UA: NEGATIVE
NITRITE: NEGATIVE
Protein, ur: NEGATIVE mg/dL
SPECIFIC GRAVITY, URINE: 1.01 (ref 1.005–1.030)
pH: 6 (ref 5.0–8.0)

## 2015-12-25 LAB — WET PREP, GENITAL
Clue Cells Wet Prep HPF POC: NONE SEEN
Sperm: NONE SEEN
TRICH WET PREP: NONE SEEN
YEAST WET PREP: NONE SEEN

## 2015-12-25 LAB — CBC
HCT: 35.5 % — ABNORMAL LOW (ref 36.0–46.0)
Hemoglobin: 12.5 g/dL (ref 12.0–15.0)
MCH: 31.1 pg (ref 26.0–34.0)
MCHC: 35.2 g/dL (ref 30.0–36.0)
MCV: 88.3 fL (ref 78.0–100.0)
Platelets: 366 10*3/uL (ref 150–400)
RBC: 4.02 MIL/uL (ref 3.87–5.11)
RDW: 13.1 % (ref 11.5–15.5)
WBC: 8.3 10*3/uL (ref 4.0–10.5)

## 2015-12-25 LAB — HCG, QUANTITATIVE, PREGNANCY: hCG, Beta Chain, Quant, S: 26529 m[IU]/mL — ABNORMAL HIGH (ref <5)

## 2015-12-25 NOTE — MAU Note (Signed)
Pt states she has been having abd pain since 2 pm today. Indicates left lower abd and states the pain is sharp and comes and goes. Denies bleeding. Denies dysuria.vomiting since yesterday.

## 2015-12-25 NOTE — Discharge Instructions (Signed)
Segundo trimestre de Psychiatristembarazo (Second Trimester of Pregnancy) El segundo trimestre va desde la semana13 hasta la 28, desde el cuarto hasta el sexto mes, y suele ser el momento en el que mejor se siente. Su organismo se ha adaptado a Charity fundraiserestar embarazada y comienza a Diplomatic Services operational officersentirse fsicamente mejor. En general, las nuseas matutinas han disminuido o han desaparecido completamente, puede tener ms energa y un aumento de apetito. El segundo trimestre es tambin la poca en la que el feto se desarrolla rpidamente. Hacia el final del sexto mes, el feto mide aproximadamente 9pulgadas (23cm) y pesa alrededor de 1 libras (700g). Es probable que sienta que el beb se Teacher, English as a foreign languagemueve (da pataditas) entre las 18 y 20semanas del Psychiatristembarazo. CAMBIOS EN EL ORGANISMO Su organismo atraviesa por muchos cambios durante el [place] Springsembarazo, y estos varan de Neomia Dearuna mujer a Educational psychologistotra.   Seguir American Standard Companiesaumentando de peso. Notar que la parte baja del abdomen sobresale.  Podrn aparecer las primeras Albertson'sestras en las caderas, el abdomen y las [place] Plattemamas.  Es posible que tenga dolores de cabeza que pueden aliviarse con los medicamentos que el mdico le permita tomar.  Tal vez tenga necesidad de orinar con ms frecuencia porque el feto est ejerciendo presin Ambulance personsobre la vejiga.  Debido al Vanetta Muldersembarazo podr sentir Anthoney Haradaacidez estomacal con frecuencia.  Puede estar estreida, ya que ciertas hormonas enlentecen los movimientos de los msculos que New York Life Insuranceempujan los desechos a travs de los intestinos.  Pueden aparecer hemorroides o abultarse e hincharse las venas (venas varicosas).  Puede tener dolor de espalda que se debe al Citigroupaumento de peso y a que las hormonas del Management consultantembarazo relajan las articulaciones entre los huesos de la pelvis, y Public librariancomo consecuencia de la modificacin del peso y los msculos que mantienen el equilibrio.  Las ConAgra Foodsmamas seguirn creciendo y Development worker, communityle dolern.  Las Veterinary surgeonencas pueden sangrar y estar sensibles al cepillado y al hilo dental.  Pueden aparecer zonas oscuras o  manchas (cloasma, mscara del Psychiatristembarazo) en el rostro que probablemente se atenuar despus del nacimiento del beb.  Es posible que se forme una lnea oscura desde el ombligo hasta la zona del pubis (linea nigra) que probablemente se atenuar despus del nacimiento del beb.  Tal vez haya cambios en el cabello que pueden incluir su engrosamiento, crecimiento rpido y cambios en la textura. Adems, a algunas mujeres se les cae el cabello durante o despus del embarazo, o tienen el cabello seco o fino. Lo ms probable es que el cabello se le normalice despus del nacimiento del beb. QU DEBE ESPERAR EN LAS CONSULTAS PRENATALES Durante una visita prenatal de rutina:  La pesarn para asegurarse de que usted y el feto estn creciendo normalmente.  Le tomarn la presin arterial.  Le medirn el abdomen para controlar el desarrollo del beb.  Se escucharn los latidos cardacos fetales.  Se evaluarn los resultados de los estudios solicitados en visitas anteriores. El mdico puede preguntarle lo siguiente:  Cmo se siente.  Si siente los movimientos del beb.  Si ha tenido sntomas anormales, como prdida de lquido, Blackburnsangrado, dolores de cabeza intensos o clicos abdominales.  Si est consumiendo algn producto que contenga tabaco, como cigarrillos, tabaco de Theatre managermascar y Administrator, Civil Servicecigarrillos electrnicos.  Si tiene Colgate-Palmolivealguna pregunta. Otros estudios que podrn realizarse durante el segundo trimestre incluyen lo siguiente:  Anlisis de sangre para detectar lo siguiente:  Concentraciones de hierro bajas (anemia).  Diabetes gestacional (entre la semana 24 y la 28).  Anticuerpos Rh.  Anlisis de orina para detectar infecciones, diabetes o protenas en la  Rexene Agent ecografa para confirmar que el beb crece y se desarrolla correctamente.  Una amniocentesis para diagnosticar posibles problemas genticos.  Estudios del feto para descartar espina bfida y sndrome de Down.  Prueba del VIH (virus  de inmunodeficiencia humana). Los exmenes prenatales de rutina incluyen la prueba de deteccin del VIH, a menos que decida no Futures trader. INSTRUCCIONES PARA EL CUIDADO EN EL HOGAR   Evite fumar, consumir hierbas, beber alcohol y tomar frmacos que no le hayan recetado. Estas sustancias qumicas afectan la formacin y el desarrollo del beb.  No consuma ningn producto que contenga tabaco, lo que incluye cigarrillos, tabaco de Theatre manager y Administrator, Civil Service. Si necesita ayuda para dejar de fumar, consulte al American Express. Puede recibir asesoramiento y otro tipo de recursos para dejar de fumar.  Siga las indicaciones del mdico en relacin con el uso de medicamentos. Durante el embarazo, hay medicamentos que son seguros de tomar y otros que no.  Haga ejercicio solamente como se lo haya indicado el mdico. Sentir clicos uterinos es un buen signo para Restaurant manager, fast food actividad fsica.  Contine comiendo alimentos sanos con regularidad.  Use un sostn que le brinde buen soporte si le [employer].  No se d baos de inmersin en agua caliente, baos turcos ni saunas.  Use el cinturn de seguridad en todo momento mientras conduce.  No coma carne cruda ni queso sin cocinar; evite el contacto con las bandejas sanitarias de los gatos y la tierra que estos animales usan. Estos elementos contienen grmenes que pueden causar defectos congnitos en el beb.  Tome las vitaminas prenatales.  Tome entre 1500 y  de calcio diariamente comenzando en la semana20 del embarazo [place].  Si est estreida, pruebe un laxante suave (si el mdico lo autoriza). Consuma ms alimentos ricos en fibra, como vegetales y frutas frescos y Radiation protection practitioner. Beba gran cantidad de lquido para mantener la orina de tono claro o color amarillo plido.  Dese baos de asiento con agua tibia para Engineer, materials o las molestias causadas por las hemorroides. Use una crema para las hemorroides si el mdico la  autoriza.  Si tiene venas varicosas, use medias de descanso. Eleve los pies durante , 3 o 4veces por da. Limite el consumo de sal en su dieta.  No levante objetos pesados, use zapatos de tacones bajos y [address].  Descanse con las piernas elevadas si tiene calambres o dolor de cintura.  Visite a su dentista si an no lo ha Occupational hygienist. Use un cepillo de dientes blando para higienizarse los dientes y psese el hilo dental con suavidad.  Puede seguir [employer], a menos que el mdico le indique lo contrario.  Concurra a todas las visitas prenatales segn las indicaciones de su mdico. SOLICITE ATENCIN MDICA SI:   [place].  Siente clicos leves, presin en la pelvis o dolor persistente en el abdomen.  Tiene nuseas, vmitos o diarrea persistentes.  Brett Fairy secrecin vaginal con mal olor.  Siente dolor al [employer]oco[employer]. SOLICITE ATENCIN MDICA DE INMEDIATO SI:   Tiene fiebre.  Tiene una prdida de lquido por la vagina.  Tiene sangrado o pequeas prdidas vaginales.  Siente dolor intenso o clicos en el abdomen.  Sube o baja de peso rpidamente.  Tiene dificultad para respirar y siente dolor de pecho.  Sbitamente se le hinchan mucho el rostro, las [place], los tobillos, los pies o las piernas.  No ha sentido los movimientos del beb durante  Georgianne Fick.  Siente un dolor de cabeza intenso que no se alivia con medicamentos.  Su visin se modifica.   Esta informacin no tiene Theme park manager el consejo del mdico. Asegrese de hacerle al mdico cualquier pregunta que tenga.   Document Released: 06/12/2005 Document Revised: 09/23/2014 Elsevier Interactive Patient Education 2016 [employer].    Dolor abdominal en el embarazo (Abdominal Pain During Pregnancy) El dolor abdominal es frecuente durante el [place]. Generalmente no causa ningn dao. El dolor abdominal puede tener numerosas causas. Algunas  causas son ms graves que otras. Ciertas causas de dolor abdominal durante el embarazo se diagnostican fcilmente. A veces, se tarda un tiempo para llegar al diagnstico. Otras veces la causa no se conoce. El dolor abdominal puede estar relacionado con [place] alteracin del [place], o puede deberse a una causa totalmente diferente. Por este motivo, siempre consulte a su mdico cuando sienta molestias abdominales. INSTRUCCIONES PARA EL CUIDADO EN EL HOGAR  Est atenta al dolor para ver si hay cambios. Las siguientes indicaciones ayudarn a Psychologist, educational [employer] pueda sentir:  No Chiropodist sexuales y no coloque nada dentro de la vagina hasta que los sntomas hayan desaparecido completamente.  Descanse todo lo que pueda [employer] dolor se le haya calmado.  Si siente nuseas, beba lquidos claros. Evite los alimentos slidos mientras sienta malestar o tenga nuseas.  Tome slo medicamentos de venta libre o recetados, segn las indicaciones del mdico.  Cumpla con todas las visitas de control, segn le indique su mdico. SOLICITE ATENCIN MDICA DE INMEDIATO SI:  Tiene un sangrado, prdida de lquidos o elimina tejidos por la vagina.  El dolor o los clicos [place].  Tiene vmitos persistentes.  Comienza a Financial risk analyst al orinar u [employer].  Tiene fiebre.  Nota que los movimientos del beb disminuyen.  Siente intensa debilidad o se marea.  Tiene dificultad para respirar con o sin dolor abdominal.  Siente un dolor de cabeza intenso junto al dolor abdominal.  Shelle Iron secrecin vaginal anormal con dolor abdominal.  Tiene diarrea persistente.  El dolor abdominal sigue o empeora an despus de Field seismologist. ASEGRESE DE QUE:   Comprende estas instrucciones.  Controlar su afeccin.  Recibir ayuda de inmediato si no mejora o si empeora.   Esta informacin no tiene Theme park manager el consejo del mdico. Asegrese de hacerle al mdico cualquier  pregunta que tenga.   Document Released: 09/02/2005 Document Revised: 06/23/2013 Elsevier Interactive Patient Education [employer]

## 2015-12-25 NOTE — MAU Provider Note (Signed)
History     CSN: [id number]  Arrival date and time: 12/25/15 [id number]   First Provider Initiated Contact with Patient 12/25/15 2030      Chief Complaint  Patient presents with  . Abdominal Pain   HPI  Shawna MayotteRuth N [name] is a 30 y.o. [id number] at unknown gestation who presents with abdominal pain. Patient does not remember when her LMP was. Had positive pregnancy test last month. Has first prenatal appointment with the health department in 2 weeks. Reports abdominal pain in the LLQ since this afternoon. Describes pain as sharp & intermittent. Rates 6/10. Has not treated. Vomited yesterday. Denies n/v/d/constipation. Denies dysuria, vaginal bleeding, vaginal discharge, or fever.   OB History    Gravida Para Term Preterm AB TAB SAB Ectopic Multiple Living   5 2 2  2  2   2       Past Medical History  Diagnosis Date  . Gallstone   . Pregnant     History reviewed. No pertinent past surgical history.  Family History  Problem Relation Age of Onset  . Diabetes Mother   . Cancer Mother   . Heart disease Father   . Hypertension Father     Social History  Substance Use Topics  . Smoking status: Never Smoker   . Smokeless tobacco: None  . Alcohol Use: No    Allergies: No Known Allergies  Prescriptions prior to admission  Medication Sig Dispense Refill Last Dose  . Prenatal Vit-Fe Fumarate-FA (PRENATAL MULTIVITAMIN) TABS tablet Take 1 tablet by mouth daily at 12 noon.   12/25/2015 at Unknown time  . azithromycin (ZITHROMAX) 250 MG tablet Take 2 today, then 1 daily days 2-5 (Patient not taking: Reported on 12/25/2015) 6 tablet 0 Completed Course at Unknown time  . omeprazole (PRILOSEC) 20 MG capsule Take 1 capsule (20 mg total) by mouth daily. (Patient not taking: Reported on 12/25/2015) 30 capsule 3 Not Taking at Unknown time    Review of Systems  Constitutional: Negative.   Gastrointestinal: Positive for abdominal pain. Negative for nausea, vomiting, diarrhea and constipation.   Genitourinary: Negative.    Physical Exam   Blood pressure 126/77, pulse 81, temperature 98.7 F (37.1 C), temperature source Oral, resp. rate 20, height 5' 2.5" (1.588 m), weight 210 lb (95.255 kg), SpO2 100 %, unknown if currently breastfeeding.  Physical Exam  Nursing note and vitals reviewed. Constitutional: She is oriented to person, place, and time. She appears well-developed and well-nourished. No distress.  HENT:  Head: Normocephalic and atraumatic.  Eyes: Conjunctivae are normal. Right eye exhibits no discharge. Left eye exhibits no discharge. No scleral icterus.  Neck: Normal range of motion.  Cardiovascular: Normal rate, regular rhythm and normal heart sounds.   No murmur heard. Respiratory: Effort normal and breath sounds normal. No respiratory distress. She has no wheezes.  GI: Soft. Bowel sounds are normal. There is tenderness in the left lower quadrant. There is no rebound and no guarding.  Neurological: She is alert and oriented to person, place, and time.  Skin: Skin is warm and dry. She is not diaphoretic.  Psychiatric: She has a normal mood and affect. Her behavior is normal. Judgment and thought content normal.    MAU Course  Procedures Results for orders placed or performed during the hospital encounter of 12/25/15 (from the past 24 hour(s))  Urinalysis, Routine w reflex microscopic ([facility])     Status: None   Collection Time: 12/25/15  7:39 PM  Result Value Ref Range  Color, Urine YELLOW YELLOW   APPearance CLEAR CLEAR   Specific Gravity, Urine 1.010 1.005 - 1.030   pH 6.0 5.0 - 8.0   Glucose, UA NEGATIVE NEGATIVE mg/dL   Hgb urine dipstick NEGATIVE NEGATIVE   Bilirubin Urine NEGATIVE NEGATIVE   Ketones, ur NEGATIVE NEGATIVE mg/dL   Protein, ur NEGATIVE NEGATIVE mg/dL   Nitrite NEGATIVE NEGATIVE   Leukocytes, UA NEGATIVE NEGATIVE  CBC     Status: Abnormal   Collection Time: 12/25/15  8:41 PM  Result Value Ref Range   WBC 8.3 4.0 - 10.5 K/uL    RBC 4.02 3.87 - 5.11 MIL/uL   Hemoglobin 12.5 12.0 - 15.0 g/dL   HCT 16.1 (L) 09.6 - 04.5 %   MCV 88.3 78.0 - 100.0 fL   MCH 31.1 26.0 - 34.0 pg   MCHC 35.2 30.0 - 36.0 g/dL   RDW 40.9 81.1 - 91.4 %   Platelets 366 150 - 400 K/uL  hCG, quantitative, pregnancy     Status: Abnormal   Collection Time: 12/25/15  8:41 PM  Result Value Ref Range   hCG, Beta Chain, Quant, S 78295 (H) <5 mIU/mL  Wet prep, genital     Status: Abnormal   Collection Time: 12/25/15  9:30 PM  Result Value Ref Range   Yeast Wet Prep HPF POC NONE SEEN NONE SEEN   Trich, Wet Prep NONE SEEN NONE SEEN   Clue Cells Wet Prep HPF POC NONE SEEN NONE SEEN   WBC, Wet Prep HPF POC MANY (A) NONE SEEN   Sperm NONE SEEN    US Ob Comp Less 14 Wks  12/25/2015  CLINICAL DATA:  Pelvic pain EXAM: OBSTETRIC <14 WK [place] AND TRANSVAGINAL OB US TECHNIQUE: Both transabdominal and transvaginal ultrasound examinations were performed for complete evaluation of the gestation as well as the maternal uterus, adnexal regions, and pelvic cul-de-sac. Transvaginal technique was performed to assess early pregnancy. COMPARISON:  None. FINDINGS: Intrauterine gestational sac: Visualized Yolk sac:  Not visualized Embryo:  Visualized Cardiac Activity: Visualized Heart Rate: 176  bpm CRL:  77  mm   13 w   6 d                  [place] EDC: June 25, 2016 Subchorionic hemorrhage:  None visualized. Maternal uterus/adnexae: Cervical os is closed. Placenta is anterior without abruption or previa evident. No intrauterine mass. Maternal ovaries are normal in size and contour bilaterally. No extrauterine pelvic mass. No free pelvic fluid. IMPRESSION: Single live intrauterine gestation with estimated gestational age of approximately 14 weeks. No subchorionic hemorrhage. No intrauterine or extrauterine pelvic mass. It may be prudent to consider comprehensive fetal anatomic evaluation in approximately 3-4 weeks. Electronically Signed   By: Bretta Bang III M.D.   On:  12/25/2015 21:11    MDM O positive UPT positive Ultrasound shows SIUP at [redacted]w[redacted]d with cardiac activity. Normal ovaries  Assessment and Plan  A: 1. Abdominal pain in pregnancy   2. Normal IUP (intrauterine pregnancy) on prenatal ultrasound, second trimester     P; Discharge home Start prenatal care with [facility] Discussed reasons to return to MAU   Judeth Horn 12/25/2015, 8:50 PM

## 2015-12-26 LAB — GC/CHLAMYDIA PROBE AMP (~~LOC~~) NOT AT ARMC
Chlamydia: NEGATIVE
Neisseria Gonorrhea: NEGATIVE

## 2015-12-26 LAB — HIV ANTIBODY (ROUTINE TESTING W REFLEX): HIV Screen 4th Generation wRfx: NONREACTIVE

## 2016-01-11 LAB — OB RESULTS CONSOLE ANTIBODY SCREEN: Antibody Screen: NEGATIVE

## 2016-01-11 LAB — CYSTIC FIBROSIS DIAGNOSTIC STUDY: Interpretation-CFDNA:: NEGATIVE

## 2016-01-11 LAB — OB RESULTS CONSOLE ABO/RH: RH TYPE: POSITIVE

## 2016-01-11 LAB — OB RESULTS CONSOLE HIV ANTIBODY (ROUTINE TESTING)
HIV: NONREACTIVE
HIV: NONREACTIVE

## 2016-01-11 LAB — CYTOLOGY - PAP: PAP SMEAR: NEGATIVE

## 2016-01-11 LAB — OB RESULTS CONSOLE GC/CHLAMYDIA
Chlamydia: NEGATIVE
Gonorrhea: NEGATIVE

## 2016-01-11 LAB — OB RESULTS CONSOLE RUBELLA ANTIBODY, IGM: Rubella: IMMUNE

## 2016-01-11 LAB — SICKLE CELL SCREEN: Sickle Cell Screen: NEGATIVE

## 2016-01-11 LAB — OB RESULTS CONSOLE RPR: RPR: NONREACTIVE

## 2016-01-11 LAB — OB RESULTS CONSOLE HEPATITIS B SURFACE ANTIGEN: Hepatitis B Surface Ag: NEGATIVE

## 2016-05-30 LAB — OB RESULTS CONSOLE GBS: GBS: NEGATIVE

## 2016-06-24 ENCOUNTER — Encounter (HOSPITAL_COMMUNITY): Payer: Self-pay | Admitting: *Deleted

## 2016-06-24 ENCOUNTER — Inpatient Hospital Stay (HOSPITAL_COMMUNITY)
Admission: AD | Admit: 2016-06-24 | Discharge: 2016-06-24 | Disposition: A | Discharge status: Home / Self Care | Payer: Self-pay | Source: Ambulatory Visit | Attending: Obstetrics and Gynecology | Admitting: Obstetrics and Gynecology

## 2016-06-24 DIAGNOSIS — Z3A39 39 weeks gestation of pregnancy: Secondary | ICD-10-CM | POA: Insufficient documentation

## 2016-06-24 DIAGNOSIS — Z3493 Encounter for supervision of normal pregnancy, unspecified, third trimester: Secondary | ICD-10-CM | POA: Insufficient documentation

## 2016-06-24 NOTE — MAU Note (Signed)
UC's since this AM. Around 1800, noted some blood tinged mucous. No leaking. + FM.

## 2016-06-24 NOTE — Discharge Instructions (Signed)
Contracciones de Physiological scientist (Braxton Hicks Contractions) Durante el [place], pueden presentarse contracciones uterinas que no siempre indican que est en [place].  QU SON LAS CONTRACCIONES DE BRAXTON HICKS?  Las [employer] se presentan antes del [place] de [place] se conocen como contracciones de [place] o falso trabajo de [place]. Hacia el final del embarazo (32 a 34semanas), estas contracciones pueden aparecen con ms frecuencia y volverse ms intensas. No corresponden al [place] de parto verdadero porque estas contracciones no producen el agrandamiento (la dilatacin) y el afinamiento del cuello del tero. Algunas veces, es difcil distinguirlas del trabajo de parto verdadero porque en algunos casos pueden ser [employer], y las personas tienen diferentes niveles de tolerancia al [employer]. No debe sentirse avergonzada si concurre al hospital con falso trabajo de [place]. En ocasiones, la nica forma de saber si el trabajo de parto es verdadero es que el mdico determine si hay cambios en el cuello del tero. Si no hay problemas prenatales u otras complicaciones de salud asociadas con el embarazo, no habr inconvenientes si la envan a su casa con falso trabajo de parto y espera que comience el verdadero. [place] DEL VERDADERO Falso trabajo de parto  Las contracciones del falso trabajo de parto duran menos y no son tan intensas como las verdaderas.  Generalmente son irregulares.  A menudo, se sienten en la parte delantera de la parte baja del abdomen y en la ingle,  y pueden desaparecer cuando camina o cambia de posicin mientras est acostada.  Las contracciones se vuelven ms dbiles y su duracin es Garment/textile technologist a medida que el tiempo transcurre.  Por lo general, no se hacen progresivamente ms intensas, regulares y Magazine features editor entre s como en el caso del [place] de parto verdadero. Antionette Fairy de parto  Las contracciones del verdadero  trabajo de parto duran de 62 a 70segundos, son muy regulares y suelen volverse ms intensas, y [place] su frecuencia.  No desaparecen cuando camina.  La molestia generalmente se siente en la parte superior del tero y se extiende hacia la zona inferior del abdomen y [employer] cintura.  El mdico podr examinarla para determinar si el trabajo de parto es verdadero. El examen mostrar si el cuello del tero se est dilatando y [place]. LO QUE DEBE RECORDAR  Contine haciendo los ejercicios habituales y siga otras indicaciones que el mdico le d.  Tome todos los medicamentos como le indic el mdico.  Consulting civil engineer a las visitas prenatales regulares.  Coma y beba con moderacin si cree que est en trabajo de parto.  Si las contracciones de [employer] provocan incomodidad:  Cambie de posicin: si est acostada o descansando, camine; si est caminando, descanse.  Sintese y descanse en una baera con agua tibia.  Beba 2 o 3vasos de [place]. La deshidratacin puede provocar contracciones.  Respire lenta y profundamente varias veces por hora. [place]? Solicite atencin mdica de inmediato si:  Las contracciones se intensifican, se hacen ms regulares y Industrial/product designer s.  Tiene una prdida de lquido por la vagina.  Tiene fiebre.  Elimina mucosidad manchada con [place].  Tiene una hemorragia vaginal abundante.  Tiene dolor abdominal permanente.  Tiene un dolor en la zona lumbar que nunca tuvo antes.  Siente que la cabeza del beb empuja hacia abajo y ejerce presin en la zona plvica.  El beb no se mueve Administrator, Civil Service.   Esta informacin no tiene como fin  reemplazar el consejo del mdico. Asegrese de hacerle al mdico cualquier pregunta que tenga.   Document Released: 06/12/2005 Document Revised: 09/07/2013 Elsevier Interactive Patient Education 2016 [employer]  Evaluacin de los movimientos fetales  (Fetal Movement  Counts) Nombre del paciente: __________________________________________________ Shawna ChapmanFecha de parto estimada: ____________________ Caroleen HammanLa evaluacin de los movimientos fetales es muy recomendable en los embarazos de alto riesgo, pero tambin es una buena idea que lo hagan todas las Deerembarazadas. El Firefightermdico le indicar que comience a contarlos a las 28 semanas de [place] Spring[place]azo. Los movimientos fetales suelen aumentar:   Despus de Animatoruna comida completa.  Despus de la actividad fsica.  Despus de comer o beber Graybar Electricalgo dulce o fro.  En reposo. Preste atencin cuando sienta que el beb est ms activo. Esto le ayudar a notar un patrn de ciclos de vigilia y sueo de su beb y cules son los factores que contribuyen a un aumento de los movimientos fetales. Es importante llevar a cabo un recuento de movimientos fetales, al mismo tiempo cada da, cuando el beb normalmente est ms activo.  CMO CONTAR LOS MOVIMIENTOS FETALES 1. Busque un lugar tranquilo y cmodo para sentarse o recostarse sobre el lado izquierdo. Al recostarse sobre su lado izquierdo, le proporciona una mejor circulacin de Effortsangre y oxgeno al beb. 2. Anote el da y la hora en una hoja de papel o en un diario. 3. Comience contando las pataditas, revoloteos, chasquidos, vueltas o pinchazos en un perodo de 2 horas. Debe sentir al menos 10 movimientos en 2 horas. 4. Si no siente 10 movimientos en 2 horas, espere 2  3 horas y cuente de nuevo. Busque cambios en el patrn o si no cuenta lo suficiente en 2 horas. SOLICITE ATENCIN MDICA SI:   Siente menos de 10 pataditas en 2 horas, en dos intentos.  No hay movimientos durante una hora.  El patrn se modifica o le lleva ms tiempo Art gallery managercada da contar las 10 pataditas.  Siente que el beb no se mueve como lo hace habitualmente. Fecha: ____________ Movimientos: ____________ Stevan BornHora de inicio: ____________ Stevan BornHora de finalizacin: ____________  Franco NonesFecha: ____________ Movimientos: ____________ Stevan BornHora de inicio:  ____________ Stevan BornHora de finalizacin: ____________  Franco NonesFecha: ____________ Movimientos: ____________ Stevan BornHora de inicio: ____________ Stevan BornHora de finalizacin: ____________  Franco NonesFecha: ____________ Movimientos: ____________ Stevan BornHora de inicio: ____________ Stevan BornHora de finalizacin: ____________  Franco NonesFecha: ____________ Movimientos: ____________ Stevan BornHora de inicio: ____________ Mammie RussianHora de finalizacin: ____________  Franco NonesFecha: ____________ Movimientos: ____________ Mammie RussianHora de inicio: ____________ Mammie RussianHora de finalizacin: ____________  Franco NonesFecha: ____________ Movimientos: ____________ Mammie RussianHora de inicio: ____________ Mammie RussianHora de finalizacin: ____________  Franco NonesFecha: ____________ Movimientos: ____________ Mammie RussianHora de inicio: ____________ Mammie RussianHora de finalizacin: ____________  Franco NonesFecha: ____________ Movimientos: ____________ Mammie RussianHora de inicio: ____________ Mammie RussianHora de finalizacin: ____________  Franco NonesFecha: ____________ Movimientos: ____________ Mammie RussianHora de inicio: ____________ Mammie RussianHora de finalizacin: ____________  Franco NonesFecha: ____________ Movimientos: ____________ Mammie RussianHora de inicio: ____________ Mammie RussianHora de finalizacin: ____________  Franco NonesFecha: ____________ Movimientos: ____________ Mammie RussianHora de inicio: ____________ Mammie RussianHora de finalizacin: ____________  Franco NonesFecha: ____________ Movimientos: ____________ Mammie RussianHora de inicio: ____________ Mammie RussianHora de finalizacin: ____________  Franco NonesFecha: ____________ Movimientos: ____________ Mammie RussianHora de inicio: ____________ Mammie RussianHora de finalizacin: ____________  Franco NonesFecha: ____________ Movimientos: ____________ Mammie RussianHora de inicio: ____________ Mammie RussianHora de finalizacin: ____________  Franco NonesFecha: ____________ Movimientos: ____________ Mammie RussianHora de inicio: ____________ Mammie RussianHora de finalizacin: ____________  Franco NonesFecha: ____________ Movimientos: ____________ Mammie RussianHora de inicio: ____________ Mammie RussianHora de finalizacin: ____________  Franco NonesFecha: ____________ Movimientos: ____________ Stevan BornHora de inicio: ____________ Stevan BornHora de finalizacin: ____________  Franco NonesFecha: ____________ Movimientos: ____________ Stevan BornHora de inicio: ____________ Stevan BornHora de finalizacin:  ____________  Franco NonesFecha: ____________ Movimientos: ____________ Mammie RussianHora  de inicio: ____________ Hora de finalización: ____________  °Fecha: ____________ Movimientos: ____________ Hora de inicio: ____________ Hora de finalización: ____________  °Fecha: ____________ Movimientos: ____________ Hora de inicio: ____________ Hora de finalización: ____________  °Fecha: ____________ Movimientos: ____________ Hora de inicio: ____________ Hora de finalización: ____________  °Fecha: ____________ Movimientos: ____________ Hora de inicio: ____________ Hora de finalización: ____________  °Fecha: ____________ Movimientos: ____________ Hora de inicio: ____________ Hora de finalización: ____________  °Fecha: ____________ Movimientos: ____________ Hora de inicio: ____________ Hora de finalización: ____________  °Fecha: ____________ Movimientos: ____________ Hora de inicio: ____________ Hora de finalización: ____________  °Fecha: ____________ Movimientos: ____________ Hora de inicio: ____________ Hora de finalización: ____________  °Fecha: ____________ Movimientos: ____________ Hora de inicio: ____________ Hora de finalización: ____________  °Fecha: ____________ Movimientos: ____________ Hora de inicio: ____________ Hora de finalización: ____________  °Fecha: ____________ Movimientos: ____________ Hora de inicio: ____________ Hora de finalización: ____________  °Fecha: ____________ Movimientos: ____________ Hora de inicio: ____________ Hora de finalización: ____________  °Fecha: ____________ Movimientos: ____________ Hora de inicio: ____________ Hora de finalización: ____________  °Fecha: ____________ Movimientos: ____________ Hora de inicio: ____________ Hora de finalización: ____________  °Fecha: ____________ Movimientos: ____________ Hora de inicio: ____________ Hora de finalización: ____________  °Fecha: ____________ Movimientos: ____________ Hora de inicio: ____________ Hora de finalización: ____________  °Fecha: ____________  Movimientos: ____________ Hora de inicio: ____________ Hora de finalización: ____________  °Fecha: ____________ Movimientos: ____________ Hora de inicio: ____________ Hora de finalización: ____________  °Fecha: ____________ Movimientos: ____________ Hora de inicio: ____________ Hora de finalización: ____________  °Fecha: ____________ Movimientos: ____________ Hora de inicio: ____________ Hora de finalización: ____________  °Fecha: ____________ Movimientos: ____________ Hora de inicio: ____________ Hora de finalización: ____________  °Fecha: ____________ Movimientos: ____________ Hora de inicio: ____________ Hora de finalización: ____________  °Fecha: ____________ Movimientos: ____________ Hora de inicio: ____________ Hora de finalización: ____________  °Fecha: ____________ Movimientos: ____________ Hora de inicio: ____________ Hora de finalización: ____________  °Fecha: ____________ Movimientos: ____________ Hora de inicio: ____________ Hora de finalización: ____________  °Fecha: ____________ Movimientos: ____________ Hora de inicio: ____________ Hora de finalización: ____________  °Fecha: ____________ Movimientos: ____________ Hora de inicio: ____________ Hora de finalización: ____________  °Fecha: ____________ Movimientos: ____________ Hora de inicio: ____________ Hora de finalización: ____________  °Fecha: ____________ Movimientos: ____________ Hora de inicio: ____________ Hora de finalización: ____________  °Fecha: ____________ Movimientos: ____________ Hora de inicio: ____________ Hora de finalización: ____________  °Fecha: ____________ Movimientos: ____________ Hora de inicio: ____________ Hora de finalización: ____________  °Fecha: ____________ Movimientos: ____________ Hora de inicio: ____________ Hora de finalización: ____________  °Fecha: ____________ Movimientos: ____________ Hora de inicio: ____________ Hora de finalización: ____________  °Fecha: ____________ Movimientos: ____________ Hora de inicio:  ____________ Hora de finalización: ____________  °Fecha: ____________ Movimientos: ____________ Hora de inicio: ____________ Hora de finalización: ____________  °Fecha: ____________ Movimientos: ____________ Hora de inicio: ____________ Hora de finalización: ____________  °  °Esta información no tiene como fin reemplazar el consejo del médico. Asegúrese de hacerle al médico cualquier pregunta que tenga. °  °Document Released: 12/10/2007 Document Revised: 08/19/2012 °Elsevier Interactive Patient Education ©2016 Elsevier Inc. ° ° °

## 2016-06-25 ENCOUNTER — Inpatient Hospital Stay (HOSPITAL_COMMUNITY): Payer: Medicaid Other | Admitting: Anesthesiology

## 2016-06-25 ENCOUNTER — Encounter (HOSPITAL_COMMUNITY): Payer: Self-pay

## 2016-06-25 ENCOUNTER — Inpatient Hospital Stay (HOSPITAL_COMMUNITY)
Admission: AD | Admit: 2016-06-25 | Discharge: 2016-06-27 | DRG: 775 | Disposition: A | Discharge status: Home / Self Care | Payer: Medicaid Other | Source: Ambulatory Visit | Attending: Obstetrics and Gynecology | Admitting: Obstetrics and Gynecology

## 2016-06-25 DIAGNOSIS — E669 Obesity, unspecified: Secondary | ICD-10-CM | POA: Diagnosis present

## 2016-06-25 DIAGNOSIS — Z6837 Body mass index (BMI) 37.0-37.9, adult: Secondary | ICD-10-CM

## 2016-06-25 DIAGNOSIS — Z833 Family history of diabetes mellitus: Secondary | ICD-10-CM

## 2016-06-25 DIAGNOSIS — Z3483 Encounter for supervision of other normal pregnancy, third trimester: Secondary | ICD-10-CM | POA: Diagnosis present

## 2016-06-25 DIAGNOSIS — O99214 Obesity complicating childbirth: Secondary | ICD-10-CM | POA: Diagnosis not present

## 2016-06-25 DIAGNOSIS — Z8249 Family history of ischemic heart disease and other diseases of the circulatory system: Secondary | ICD-10-CM

## 2016-06-25 DIAGNOSIS — Z3A40 40 weeks gestation of pregnancy: Secondary | ICD-10-CM

## 2016-06-25 LAB — CBC
HCT: 40.6 % (ref 36.0–46.0)
HEMOGLOBIN: 14.4 g/dL (ref 12.0–15.0)
MCH: 31.1 pg (ref 26.0–34.0)
MCHC: 35.5 g/dL (ref 30.0–36.0)
MCV: 87.7 fL (ref 78.0–100.0)
Platelets: 344 10*3/uL (ref 150–400)
RBC: 4.63 MIL/uL (ref 3.87–5.11)
RDW: 14.3 % (ref 11.5–15.5)
WBC: 9.9 10*3/uL (ref 4.0–10.5)

## 2016-06-25 LAB — TYPE AND SCREEN
ABO/RH(D): O POS
Antibody Screen: NEGATIVE

## 2016-06-25 LAB — RPR: RPR: NONREACTIVE

## 2016-06-25 MED ORDER — DIPHENHYDRAMINE HCL 25 MG PO CAPS: 25.0000 mg | ORAL_CAPSULE | Freq: Four times a day (QID) | ORAL | Status: DC | PRN

## 2016-06-25 MED ORDER — ONDANSETRON HCL 4 MG/2ML IJ SOLN: 4.0000 mg | Freq: Four times a day (QID) | INTRAMUSCULAR | Status: DC | PRN

## 2016-06-25 MED ORDER — EPHEDRINE 5 MG/ML INJ: 10.0000 mg | INTRAVENOUS | Status: DC | PRN

## 2016-06-25 MED ORDER — OXYTOCIN 40 UNITS IN LACTATED RINGERS INFUSION - SIMPLE MED
1.0000 m[IU]/min | INTRAVENOUS | Status: DC
  Administered 2016-06-25: 2 m[IU]/min via INTRAVENOUS

## 2016-06-25 MED ORDER — FLEET ENEMA 7-19 GM/118ML RE ENEM: 1.0000 | ENEMA | RECTAL | Status: DC | PRN

## 2016-06-25 MED ORDER — PHENYLEPHRINE 40 MCG/ML (10ML) SYRINGE FOR IV PUSH (FOR BLOOD PRESSURE SUPPORT): 80.0000 ug | PREFILLED_SYRINGE | INTRAVENOUS | Status: DC | PRN

## 2016-06-25 MED ORDER — LACTATED RINGERS IV SOLN
500.0000 mL | Freq: Once | INTRAVENOUS | Status: AC
  Administered 2016-06-25: 500 mL via INTRAVENOUS

## 2016-06-25 MED ORDER — TETANUS-DIPHTH-ACELL PERTUSSIS 5-2.5-18.5 LF-MCG/0.5 IM SUSP
0.5000 mL | Freq: Once | INTRAMUSCULAR | Status: DC
  Filled 2016-06-25: qty 0.5

## 2016-06-25 MED ORDER — DIBUCAINE 1 % RE OINT
1.0000 | TOPICAL_OINTMENT | RECTAL | Status: DC | PRN
Start: 2016-06-25 — End: 2016-06-27

## 2016-06-25 MED ORDER — ONDANSETRON HCL 4 MG/2ML IJ SOLN: 4.0000 mg | INTRAMUSCULAR | Status: DC | PRN

## 2016-06-25 MED ORDER — SENNOSIDES-DOCUSATE SODIUM 8.6-50 MG PO TABS
2.0000 | ORAL_TABLET | ORAL | Status: DC
  Administered 2016-06-25 – 2016-06-26 (×2): 2 via ORAL
  Filled 2016-06-25 (×2): qty 2

## 2016-06-25 MED ORDER — LACTATED RINGERS IV SOLN
INTRAVENOUS | Status: DC
  Administered 2016-06-25: 05:00:00 via INTRAVENOUS
  Administered 2016-06-25: 125 mL via INTRAVENOUS

## 2016-06-25 MED ORDER — DIPHENHYDRAMINE HCL 50 MG/ML IJ SOLN: 12.5000 mg | INTRAMUSCULAR | Status: DC | PRN

## 2016-06-25 MED ORDER — PHENYLEPHRINE 40 MCG/ML (10ML) SYRINGE FOR IV PUSH (FOR BLOOD PRESSURE SUPPORT)
PREFILLED_SYRINGE | INTRAVENOUS | Status: AC
  Filled 2016-06-25: qty 10

## 2016-06-25 MED ORDER — ACETAMINOPHEN 325 MG PO TABS
650.0000 mg | ORAL_TABLET | ORAL | Status: DC | PRN
Start: 2016-06-25 — End: 2016-06-27

## 2016-06-25 MED ORDER — ONDANSETRON HCL 4 MG PO TABS: 4.0000 mg | ORAL_TABLET | ORAL | Status: DC | PRN

## 2016-06-25 MED ORDER — FENTANYL 2.5 MCG/ML BUPIVACAINE 1/10 % EPIDURAL INFUSION (WH - ANES)
INTRAMUSCULAR | Status: AC
  Filled 2016-06-25: qty 125

## 2016-06-25 MED ORDER — ZOLPIDEM TARTRATE 5 MG PO TABS: 5.0000 mg | ORAL_TABLET | Freq: Every evening | ORAL | Status: DC | PRN

## 2016-06-25 MED ORDER — BENZOCAINE-MENTHOL 20-0.5 % EX AERO
1.0000 "application " | INHALATION_SPRAY | CUTANEOUS | Status: DC | PRN
  Filled 2016-06-25 (×2): qty 56

## 2016-06-25 MED ORDER — COCONUT OIL OIL
1.0000 "application " | TOPICAL_OIL | Status: DC | PRN
  Filled 2016-06-25: qty 120

## 2016-06-25 MED ORDER — SIMETHICONE 80 MG PO CHEW: 80.0000 mg | CHEWABLE_TABLET | ORAL | Status: DC | PRN

## 2016-06-25 MED ORDER — IBUPROFEN 600 MG PO TABS
600.0000 mg | ORAL_TABLET | Freq: Four times a day (QID) | ORAL | Status: DC
  Administered 2016-06-25 – 2016-06-27 (×8): 600 mg via ORAL
  Filled 2016-06-25 (×8): qty 1

## 2016-06-25 MED ORDER — FENTANYL 2.5 MCG/ML BUPIVACAINE 1/10 % EPIDURAL INFUSION (WH - ANES): 14.0000 mL/h | INTRAMUSCULAR | Status: DC | PRN

## 2016-06-25 MED ORDER — OXYTOCIN 40 UNITS IN LACTATED RINGERS INFUSION - SIMPLE MED
2.5000 [IU]/h | INTRAVENOUS | Status: DC
  Filled 2016-06-25: qty 1000

## 2016-06-25 MED ORDER — SOD CITRATE-CITRIC ACID 500-334 MG/5ML PO SOLN: 30.0000 mL | ORAL | Status: DC | PRN

## 2016-06-25 MED ORDER — ACETAMINOPHEN 325 MG PO TABS
650.0000 mg | ORAL_TABLET | ORAL | Status: DC | PRN
  Administered 2016-06-25 – 2016-06-26 (×2): 650 mg via ORAL
  Filled 2016-06-25 (×2): qty 2

## 2016-06-25 MED ORDER — LACTATED RINGERS IV SOLN: 500.0000 mL | INTRAVENOUS | Status: DC | PRN

## 2016-06-25 MED ORDER — OXYCODONE-ACETAMINOPHEN 5-325 MG PO TABS: 2.0000 | ORAL_TABLET | ORAL | Status: DC | PRN

## 2016-06-25 MED ORDER — OXYCODONE-ACETAMINOPHEN 5-325 MG PO TABS: 1.0000 | ORAL_TABLET | ORAL | Status: DC | PRN

## 2016-06-25 MED ORDER — OXYTOCIN BOLUS FROM INFUSION: 500.0000 mL | Freq: Once | INTRAVENOUS | Status: DC

## 2016-06-25 MED ORDER — PRENATAL MULTIVITAMIN CH
1.0000 | ORAL_TABLET | Freq: Every day | ORAL | Status: DC
  Administered 2016-06-26 – 2016-06-27 (×2): 1 via ORAL
  Filled 2016-06-25 (×2): qty 1

## 2016-06-25 MED ORDER — LIDOCAINE HCL (PF) 1 % IJ SOLN
30.0000 mL | INTRAMUSCULAR | Status: DC | PRN
  Filled 2016-06-25: qty 30

## 2016-06-25 MED ORDER — TERBUTALINE SULFATE 1 MG/ML IJ SOLN: 0.2500 mg | Freq: Once | INTRAMUSCULAR | Status: DC | PRN

## 2016-06-25 MED ORDER — FENTANYL 2.5 MCG/ML BUPIVACAINE 1/10 % EPIDURAL INFUSION (WH - ANES)
14.0000 mL/h | INTRAMUSCULAR | Status: DC | PRN
  Administered 2016-06-25: 12 mL/h via EPIDURAL
  Filled 2016-06-25: qty 125

## 2016-06-25 MED ORDER — WITCH HAZEL-GLYCERIN EX PADS: 1.0000 "application " | MEDICATED_PAD | CUTANEOUS | Status: DC | PRN

## 2016-06-25 MED ORDER — LIDOCAINE HCL (PF) 1 % IJ SOLN
INTRAMUSCULAR | Status: DC | PRN
  Administered 2016-06-25: 3 mL via EPIDURAL
  Administered 2016-06-25: 4 mL via EPIDURAL

## 2016-06-25 NOTE — Anesthesia Procedure Notes (Signed)
Epidural Patient location during procedure: OB Start time: 06/25/2016 4:33 AM  Staffing Anesthesiologist: Mal AmabileFOSTER, Sallie Maker Performed: anesthesiologist   Preanesthetic Checklist Completed: patient identified, site marked, surgical consent, pre-op evaluation, timeout performed, IV checked, risks and benefits discussed and monitors and equipment checked  Epidural Patient position: sitting Prep: site prepped and draped and DuraPrep Patient monitoring: continuous pulse ox and blood pressure Approach: midline Location: L3-L4 Injection technique: LOR air  Needle:  Needle type: Tuohy  Needle gauge: 17 G Needle length: 9 cm and 9 Needle insertion depth: 7 cm Catheter type: closed end flexible Catheter size: 19 Gauge Catheter at skin depth: 12 cm Test dose: negative and Other  Assessment Events: blood not aspirated, injection not painful, no injection resistance, negative IV test and no paresthesia  Additional Notes Patient identified. Risks and benefits discussed including failed block, incomplete  Pain control, post dural puncture headache, nerve damage, paralysis, blood pressure Changes, nausea, vomiting, reactions to medications-both toxic and allergic and post Partum back pain. All questions were answered. Patient expressed understanding and wished to proceed. Sterile technique was used throughout procedure. Epidural site was Dressed with sterile barrier dressing. No paresthesias, signs of intravascular injection Or signs of intrathecal spread were encountered. Spanish interpreter used throughout procedure. Patient was more comfortable after the epidural was dosed. Please see RN's note for documentation of vital signs and FHR which are stable.

## 2016-06-25 NOTE — Anesthesia Pain Management Evaluation Note (Signed)
  CRNA Pain Management Visit Note  Patient: Shawna MayotteRuth N [name], 30 y.o., female  "Hello I am a [facility]. We have an anesthesia team available at all times to provide care throughout the hospital, including epidural management and anesthesia for C-section. I don't know your plan for the delivery whether it a natural birth, water birth, IV sedation, nitrous supplementation, doula or epidural, but we want to meet your pain goals."   1.Was your pain managed to your expectations on prior hospitalizations?   Yes   2.What is your expectation for pain management during this hospitalization?     Epidural  3.How can we help you reach that goal? epidural  Record the patient's initial score and the patient's pain goal.   Pain: 0  Pain Goal: 4 [facility] wants you to be able to say your pain was always managed very well.  Zoe Goonan 06/25/2016

## 2016-06-25 NOTE — Progress Notes (Signed)
Patient resting comfortably in bed after arom. If unchanged at next cervical exam will start pitocin.

## 2016-06-25 NOTE — Anesthesia Preprocedure Evaluation (Signed)
Anesthesia Evaluation  Patient identified by MRN, date of birth, ID band Patient awake    Reviewed: Allergy & Precautions, NPO status , Patient's Chart, lab work & pertinent test results  Airway Mallampati: III       Dental no notable dental hx. (+) Teeth Intact   Pulmonary neg pulmonary ROS,    Pulmonary exam normal breath sounds clear to auscultation       Cardiovascular negative cardio ROS Normal cardiovascular exam Rhythm:Regular Rate:Normal     Neuro/Psych negative neurological ROS  negative psych ROS   GI/Hepatic negative GI ROS, Neg liver ROS,   Endo/Other  Obesity  Renal/GU negative Renal ROS  negative genitourinary   Musculoskeletal negative musculoskeletal ROS (+)   Abdominal (+) + obese,   Peds  Hematology negative hematology ROS (+)   Anesthesia Other Findings   Reproductive/Obstetrics (+) Pregnancy                             Lab Results  Component Value Date   WBC 9.9 06/25/2016   HGB 14.4 06/25/2016   HCT 40.6 06/25/2016   MCV 87.7 06/25/2016   PLT 344 06/25/2016    Anesthesia Physical Anesthesia Plan  ASA: II  Anesthesia Plan: Epidural   Post-op Pain Management:    Induction:   Airway Management Planned: Natural Airway  Additional Equipment:   Intra-op Plan:   Post-operative Plan:   Informed Consent: I have reviewed the patients History and Physical, chart, labs and discussed the procedure including the risks, benefits and alternatives for the proposed anesthesia with the patient or authorized representative who has indicated his/her understanding and acceptance.     Plan Discussed with: Anesthesiologist  Anesthesia Plan Comments:         Anesthesia Quick Evaluation

## 2016-06-25 NOTE — H&P (Signed)
LABOR AND DELIVERY ADMISSION HISTORY AND PHYSICAL NOTE  Shawna [name] is a 30 y.o. female [phone] [id number] with IUP at 52103w0d by US presenting for SOL. She was seen in earlier in the evening for a labor check, but was unchanged after 1 hour. When she got home, she started having strong contractions every 5 minutes. She returned to the MAU and her cervix had changed from 3cm to 5cm. She was admitted in active labor.  She reports positive fetal movement. She denies leakage of fluid or vaginal bleeding.  Prenatal History/Complications:  - Abnormal 1hr GTT (138) - Hx maternal hypertriglyceridemia - Hx maternal cholelithiasis, no surgery - Hx maternal obesity (BMI 39)  Past Medical History: Past Medical History:  Diagnosis Date  . Gallstone   . Pregnant     Past Surgical History: No past surgical history on file.  Obstetrical History: OB History    Gravida Para Term Preterm AB Living   5 2 2   2 2    SAB TAB Ectopic Multiple Live Births   2       2      Social History: Social History   Social History  . Marital status: Single    Spouse name: N/A  . Number of children: N/A  . Years of education: N/A   Social History Main Topics  . Smoking status: Never Smoker  . Smokeless tobacco: Not on file  . Alcohol use No  . Drug use: No  . Sexual activity: Not on file   Other Topics Concern  . Not on file   Social History Narrative  . No narrative on file    Family History: Family History  Problem Relation Age of Onset  . Diabetes Mother   . Cancer Mother   . Heart disease Father   . Hypertension Father     Allergies: No Known Allergies  Prescriptions Prior to Admission  Medication Sig Dispense Refill Last Dose  . omeprazole (PRILOSEC) 20 MG capsule Take 1 capsule (20 mg total) by mouth daily. (Patient not taking: Reported on 12/25/2015) 30 capsule 3 Not Taking at Unknown time  . Prenatal Vit-Fe Fumarate-FA (PRENATAL MULTIVITAMIN) TABS tablet Take 1 tablet by mouth daily  at 12 noon.   12/25/2015 at Unknown time     Review of Systems   All systems reviewed and negative except as stated in HPI  unknown if currently breastfeeding. General appearance: alert and cooperative Lungs: clear to auscultation bilaterally Heart: regular rate and rhythm Abdomen: soft, non-tender; bowel sounds normal Extremities: No calf swelling or tenderness Presentation: cephalic by exam Fetal monitoring: Category I Uterine activity: Moderate contractions    Prenatal labs: ABO, Rh:  O POS Antibody:  Negative Rubella: Immune RPR:  Negative HBsAg:   Negative HIV: Non Reactive (04/10 2041)  GBS:    1 hr Glucola: 114 (early), 138 (28 weeks), passed 3hr GTT Genetic screening:  CF screen neg, Quad screen neg Anatomy US: Normal, EFW 53%  Prenatal Transfer Tool  Maternal Diabetes: No Genetic Screening: Normal Maternal Ultrasounds/Referrals: Normal Fetal Ultrasounds or other Referrals:  None Maternal Substance Abuse:  No Significant Maternal Medications:  None Significant Maternal Lab Results: Lab values include: Group B Strep negative  No results found for this or any previous visit (from the past 24 hour(s)).  Patient Active Problem List   Diagnosis Date Noted  . Calculus of gallbladder and bile duct without cholecystitis or obstruction 08/24/2015  . Hypertriglyceridemia 07/12/2014  . Cholelithiases 01/03/2014  . Hyperglycemia 01/03/2014  Assessment: Shawna [name] is a 30 y.o. [id number] at [redacted]w[redacted]d here for SOL.  #Labor: Progressing spontaneously. Membranes intact. #Pain: Desires epidural #FWB: Category I #ID: GBS negative #MOF: Both #MOC: POPs #Circ: No  Shawna [name] 06/25/2016, 3:24 AM   The patient was seen and examined by me also Agree with note NST reactive and reassuring UCs as listed Cervical exams as listed in note Admit to Birthing suites Anticipate SVd Aviva Signs, CNM

## 2016-06-26 MED ORDER — INFLUENZA VAC SPLIT QUAD 0.5 ML IM SUSY
0.5000 mL | PREFILLED_SYRINGE | INTRAMUSCULAR | Status: AC
  Administered 2016-06-27: 0.5 mL via INTRAMUSCULAR
  Filled 2016-06-26: qty 0.5

## 2016-06-26 NOTE — Lactation Note (Signed)
This note was copied from a baby's chart. Lactation Consultation Note: infant crying and mother soothing infant when I arrived in room. Mother states that she just fed infant 25ml of formula. Mother states that she breastfed infant before giving formula.  Mother was given Lactation Brochure with advise to breastfeed infant 8-12 times in 24 hours and with all feeding cues. Mother receptive to teaching plan.   Patient Name: Shawna [name]     Maternal Data    Feeding    LATCH Score/Interventions                      Lactation Tools Discussed/Used     Consult Status      Shawna [name], Shawna [name] [name], 2:07 PM

## 2016-06-26 NOTE — Progress Notes (Signed)
POSTPARTUM PROGRESS NOTE  Post Partum Day 1 Subjective:  Shawna [name] is a 30 y.o. [id number] 4323w0d s/p SVD.  No acute events overnight.  Pt denies problems with ambulating, voiding or po intake.  She denies nausea or vomiting.  Pain is moderately controlled.  She has had flatus. She has not had bowel movement.  Lochia Small.    Objective: Blood pressure 115/72, pulse 84, temperature 98 F (36.7 C), resp. rate 18, height 5' 2.5" (1.588 m), weight 210 lb (95.3 kg), SpO2 100 %, unknown if currently breastfeeding.  Physical Exam:  General: alert, cooperative and no distress Lochia:normal flow Chest: no respiratory distress Heart:regular rate, distal pulses intact Abdomen: soft, nontender,  Uterine Fundus: firm, appropriately tender DVT Evaluation: No calf swelling or tenderness Extremities: trace edema   Recent Labs  06/25/16 0322  HGB 14.4  HCT 40.6    Assessment/Plan:  ASSESSMENT: Shawna [name] is a 30 y.o. [id number] 3523w0d s/p SVD  Plan for discharge tomorrow, Breastfeeding and Contraception POP   LOS: 1 day   Les Pouicholas SchenkMD 06/26/2016, 12:43 PM

## 2016-06-26 NOTE — Anesthesia Postprocedure Evaluation (Signed)
Anesthesia Post Note  Patient: Shawna [name]  Procedure(s) Performed: * No procedures listed *  Patient location during evaluation: Mother Baby Anesthesia Type: Epidural Level of consciousness: awake and alert Pain management: pain level controlled Vital Signs Assessment: post-procedure vital signs reviewed and stable Respiratory status: spontaneous breathing, nonlabored ventilation and respiratory function stable Cardiovascular status: stable Postop Assessment: no headache, no backache and epidural receding Anesthetic complications: no     Last Vitals:  Vitals:   06/25/16 2349 06/26/16 0520  BP: 126/76 122/62  Pulse: 97 64  Resp: 20 18  Temp: 36.7 C 36.7 C    Last Pain:  Vitals:   06/26/16 0529  TempSrc:   PainSc: 8    Pain Goal:                 Junious SilkGILBERT,Laurice Kimmons

## 2016-06-26 NOTE — Progress Notes (Signed)
UR chart review completed.  

## 2016-06-27 MED ORDER — ACETAMINOPHEN 325 MG PO TABS: 650.0000 mg | ORAL_TABLET | ORAL | 0 refills | Status: DC | PRN

## 2016-06-27 MED ORDER — IBUPROFEN 600 MG PO TABS: 600.0000 mg | ORAL_TABLET | Freq: Four times a day (QID) | ORAL | 0 refills | Status: DC

## 2016-06-27 NOTE — Lactation Note (Signed)
This note was copied from a baby's chart. Lactation Consultation Note: mother just finished feeding infant 60 ml of formula. Mother is breastfeeding some but this time on bottle. Infant is screaming . Mother states the infant did burp well. Assist mother with soothing infant.  Mother was given a harmony hand pump and advised to use as needed. Encourage to use and limit use of formula. Mother receptive to teaching.   Patient Name: Boy Nanetta BattyRuth Cortez-Pena [id number]'s Date: 06/27/2016 Reason for consult: Follow-up assessment   Maternal Data    Feeding Feeding Type: Formula  LATCH Score/Interventions Latch: Grasps breast easily, tongue down, lips flanged, rhythmical sucking.  Audible Swallowing: Spontaneous and intermittent  Type of Nipple: Everted at rest and after stimulation  Comfort (Breast/Nipple): Soft / non-tender     Hold (Positioning): No assistance needed to correctly position infant at breast.  LATCH Score: 10  Lactation Tools Discussed/Used     Consult Status      Michel BickersKendrick, Jathan Balling McCoy 06/27/2016, 9:49 AM

## 2016-06-27 NOTE — Discharge Summary (Signed)
OB Discharge Summary     Patient Name: Shawna [name] DOB: 18-Apr-1986 MRN: [id number]  Date of admission: 06/25/2016 Delivering MD: Marylene LandKOOISTRA, KATHRYN LORRAINE   Date of discharge: 06/27/2016  Admitting diagnosis: 40 WEEKS CTX Intrauterine pregnancy: 3729w0d     Secondary diagnosis:  Active Problems:   Normal labor  Additional problems: none     Discharge diagnosis: Term Pregnancy Delivered                                                                                                Post partum procedures:none  Augmentation: Pitocin  Complications: None  Hospital course:  Onset of Labor With Vaginal Delivery     30 y.o. yo [id number] at 6329w0d was admitted in Active Labor on 06/25/2016. Patient had an uncomplicated labor course as follows:  Membrane Rupture Time/Date: 11:42 AM ,06/25/2016   Intrapartum Procedures: Episiotomy: None [1]                                         Lacerations:  1st degree [2]  Patient had a delivery of a Viable infant. 06/25/2016  Information for the patient's newborn:  Higinio PlanCortez-Pena, Boy Gloriajean [[id number]][[id number]]  Delivery Method: Vag-Spont    Pateint had an uncomplicated postpartum course.  She is ambulating, tolerating a regular diet, passing flatus, and urinating well. Patient is discharged home in stable condition on 06/27/16.    Physical exam Vitals:   06/26/16 0520 06/26/16 0959 06/26/16 1812 06/27/16 0520  BP: 122/62 115/72 119/76 98/64  Pulse: 64 84 90 65  Resp: 18 18 17 18   Temp: 98 F (36.7 C) 98 F (36.7 C) 98.6 F (37 C) 98 F (36.7 C)  TempSrc: Oral  Oral Oral  SpO2:      Weight:      Height:       General: alert, cooperative and no distress Lochia: appropriate Uterine Fundus: firm Incision: N/A DVT Evaluation: No evidence of DVT seen on physical exam. Labs: Lab Results  Component Value Date   WBC 9.9 06/25/2016   HGB 14.4 06/25/2016   HCT 40.6 06/25/2016   MCV 87.7 06/25/2016   PLT 344 06/25/2016   CMP Latest Ref Rng  & Units 07/25/2015  Glucose 65 - 99 mg/dL 130(Q124(H)  BUN 6 - 20 mg/dL 11  Creatinine 6.570.44 - 8.461.00 mg/dL 9.620.55  Sodium 952135 - 841145 mmol/L 138  Potassium 3.5 - 5.1 mmol/L 4.1  Chloride 101 - 111 mmol/L 104  CO2 22 - 32 mmol/L 23  Calcium 8.9 - 10.3 mg/dL 9.3  Total Protein 6.5 - 8.1 g/dL 7.7  Total Bilirubin 0.3 - 1.2 mg/dL 0.5  Alkaline Phos 38 - 126 U/L 81  AST 15 - 41 U/L 31  ALT 14 - 54 U/L 46    Discharge instruction: per After Visit Summary and "Baby and Me Booklet".  After visit meds:    Medication List    TAKE these medications   acetaminophen 325 MG tablet Commonly known as:  TYLENOL  Take 2 tablets (650 mg total) by mouth every 4 (four) hours as needed (for pain scale < 4).   ibuprofen 600 MG tablet Commonly known as:  ADVIL,MOTRIN Take 1 tablet (600 mg total) by mouth every 6 (six) hours.   prenatal multivitamin Tabs tablet Take 1 tablet by mouth daily at 12 noon.       Diet: routine diet  Activity: Advance as tolerated. Pelvic rest for 6 weeks.   Outpatient follow up:6 weeks Follow up Appt:No future appointments. Follow up Visit:No Follow-up on file.  Postpartum contraception: Progesterone only pills  Newborn Data: Live born female  Birth Weight: 7 lb 10.9 oz (3485 g) APGAR: 9, 9  Baby Feeding: Bottle and Breast Disposition:home with mother   06/27/2016 Ernestina Penna, MD

## 2016-06-27 NOTE — Discharge Instructions (Signed)
Parto vaginal, Cuidados posteriores  °(Vaginal Delivery, Care After) °Siga estas instrucciones durante las próximas semanas. Estas indicaciones para el alta le proporcionan información general acerca de cómo deberá cuidarse después del parto. El médico también podrá darle instrucciones específicas. El tratamiento ha sido planificado según las prácticas médicas actuales, pero en algunos casos pueden ocurrir problemas. Comuníquese con el médico si tiene algún problema o tiene preguntas al volver a su casa.  °INSTRUCCIONES PARA EL CUIDADO EN EL HOGAR  °· Tome sólo medicamentos de venta libre o recetados, según las indicaciones del médico o del farmacéutico. °· No beba alcohol, especialmente si está amamantando o toma analgésicos. °· No mastique tabaco ni fume. °· No consuma drogas. °· Continúe con un adecuado cuidado perineal. El buen cuidado perineal incluye: °¨ Higienizarse de adelante hacia atrás. °¨ Mantener la zona perineal limpia. °· No use tampones ni duchas vaginales hasta que su médico la autorice. °· Dúchese, lávese el cabello y tome baños de inmersión según las indicaciones de su médico. °· Utilice un sostén que le ajuste bien y que brinde buen soporte a sus mamas. °· Consuma alimentos saludables. °· Beba suficiente líquido para mantener la orina clara o de color amarillo pálido. °· Consuma alimentos ricos en fibra como cereales y panes integrales, arroz, frijoles y frutas y verduras frescas todos los días. Estos alimentos pueden ayudarla a prevenir o aliviar el estreñimiento. °· Siga las recomendaciones de su médico relacionadas con la reanudación de actividades como subir escaleras, conducir automóviles, levantar objetos, hacer ejercicios o viajar. °· Hable con su médico acerca de reanudar la actividad sexual. Volver a la actividad sexual depende del riesgo de infección, la velocidad de la curación y la comodidad y su deseo de reanudarla. °· Trate de que alguien la ayude con las actividades del hogar y con  el recién nacido al menos durante un par de días después de salir del hospital. °· Descanse todo lo que pueda. Trate de descansar o tomar una siesta mientras el bebé está durmiendo. °· Aumente sus actividades gradualmente. °· Cumpla con todas las visitas de control programadas para después del parto. Es muy importante asistir a todas las citas programadas de seguimiento. En estas citas, su médico va a controlarla para asegurarse de que esté sanando física y emocionalmente. °SOLICITE ATENCIÓN MÉDICA SI:  °· Elimina coágulos grandes por la vagina. Guarde algunos coágulos para mostrarle al médico. °· Tiene una secreción con feo olor que proviene de la vagina. °· Tiene dificultad para orinar. °· Orina con frecuencia. °· Siente dolor al orinar. °· Nota un cambio en sus movimientos intestinales. °· Aumenta el enrojecimiento, el dolor o la hinchazón en la zona de la incisión vaginal (episiotomía) o el desgarro vaginal. °· Tiene pus que drena por la episiotomía o el desgarro vaginal. °· La episiotomía o el desgarro vaginal se abren. °· Sus mamas le duelen, están duras o enrojecidas. °· Sufre un dolor intenso de cabeza. °· Tiene visión borrosa o ve manchas. °· Se siente triste o deprimida. °· Tiene pensamientos acerca de lastimarse o dañar al recién nacido. °· Tiene preguntas acerca de su cuidado personal, el cuidado del recién nacido o acerca de los medicamentos. °· Se siente mareada o sufre un desmayo. °· Tiene una erupción. °· Tiene náuseas o vómitos. °· Usted amamantó al bebé y no ha tenido su período menstrual dentro de las 12 semanas después de dejar de amamantar. °· No amamanta al bebé y no tuvo su período menstrual en las últimas 12° semanas después del   parto. °· Tiene fiebre. °SOLICITE ATENCIÓN MÉDICA DE INMEDIATO SI:  °· Siente dolor persistente. °· Siente dolor en el pecho. °· Le falta el aire. °· Se desmaya. °· Siente dolor en la pierna. °· Siente dolor en el estómago. °· El sangrado vaginal satura dos o más  apósitos en 1 hora. °  °Esta información no tiene como fin reemplazar el consejo del médico. Asegúrese de hacerle al médico cualquier pregunta que tenga. °  °Document Released: 09/02/2005 Document Revised: 05/24/2015 °Elsevier Interactive Patient Education ©2016 Elsevier Inc. ° °

## 2016-07-31 IMAGING — US US OB COMP LESS 14 WK
1 series · 13 of 28 positions shown · non-contrast
Comparison: Pelvic ultrasound performed 04/13/2015

CLINICAL DATA: Acute onset of pelvic pain.  Initial encounter.

EXAM:
OBSTETRIC <14 WK US AND TRANSVAGINAL OB US
TECHNIQUE: Both transabdominal and transvaginal ultrasound examinations were
performed for complete evaluation of the gestation as well as the
maternal uterus, adnexal regions, and pelvic cul-de-sac.
Transvaginal technique was performed to assess early pregnancy.

[Series 1: us ob comp less 14 wk · 0.24mm/px · 13 of 42 slices shown]
[im 2/42]
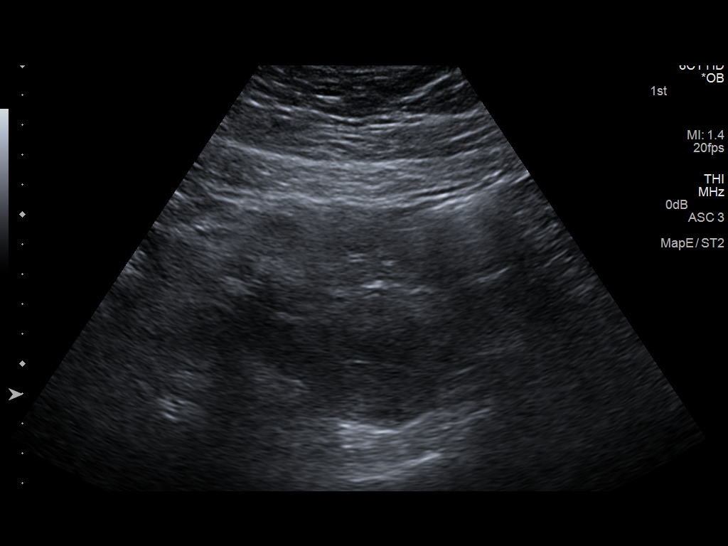
[im 5/42]
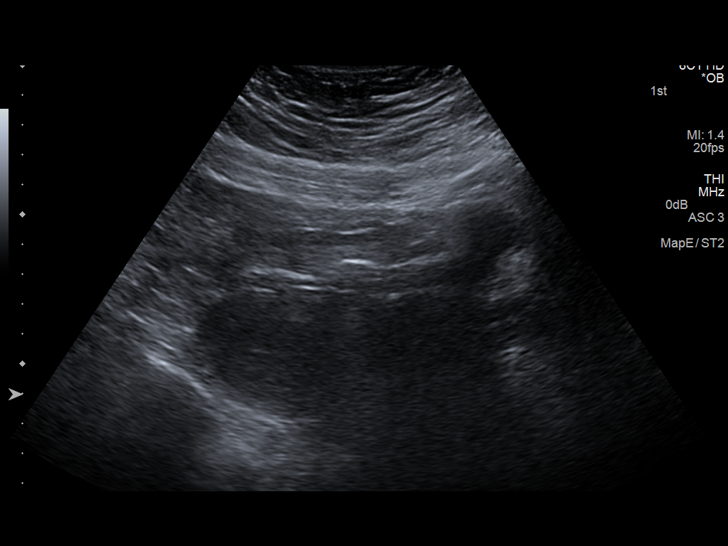
[im 8/42]
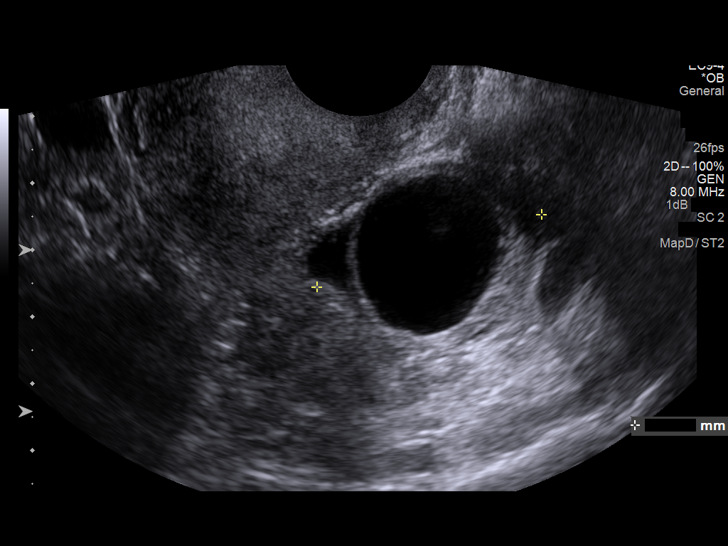
[im 11/42]
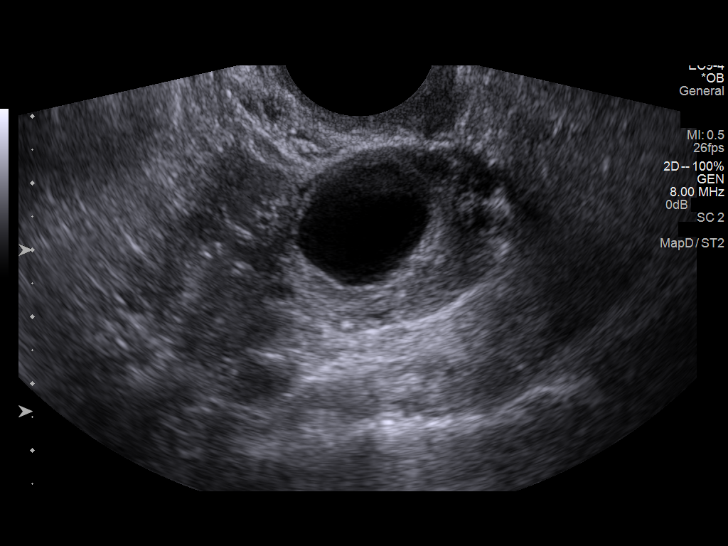
[im 14/42]
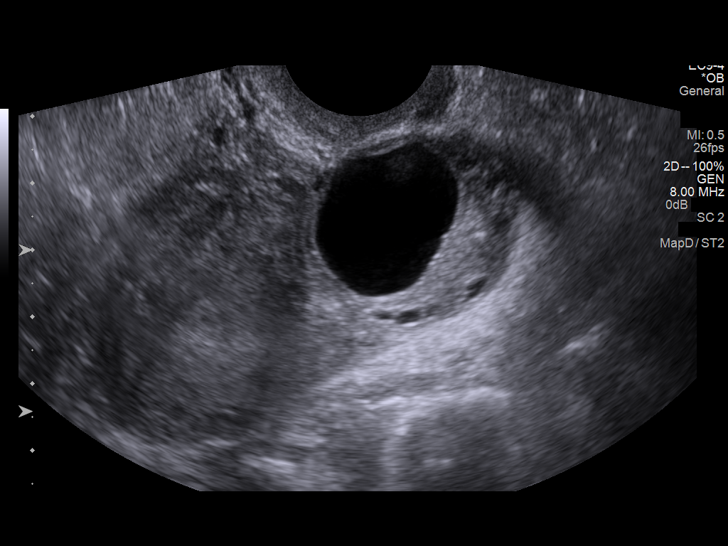
[im 17/42]
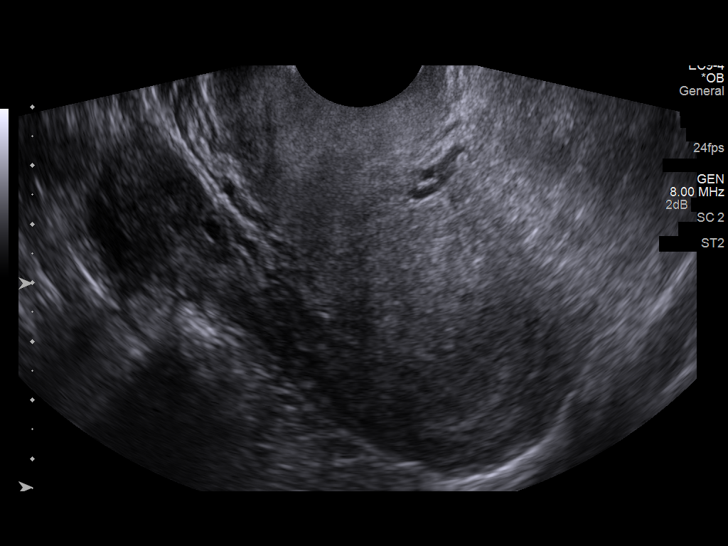
[im 22/42]
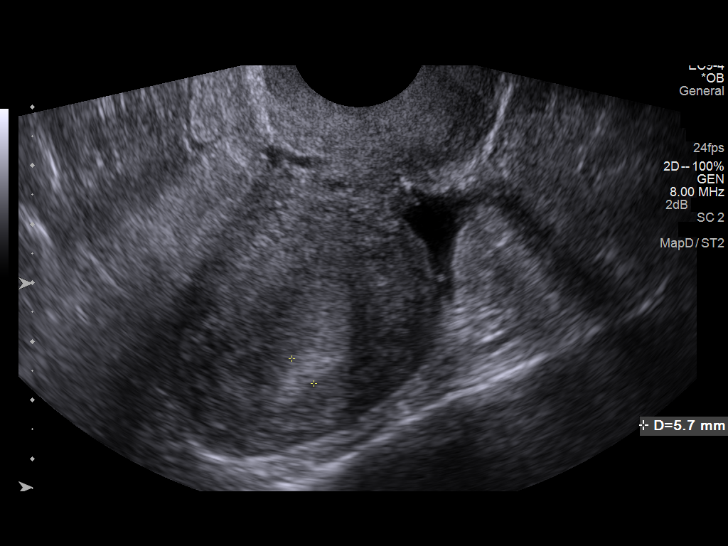
[im 25/42]
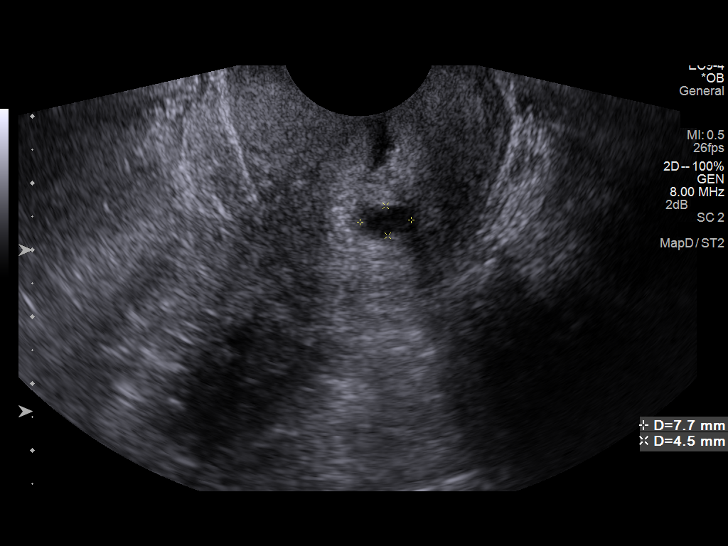
[im 28/42]
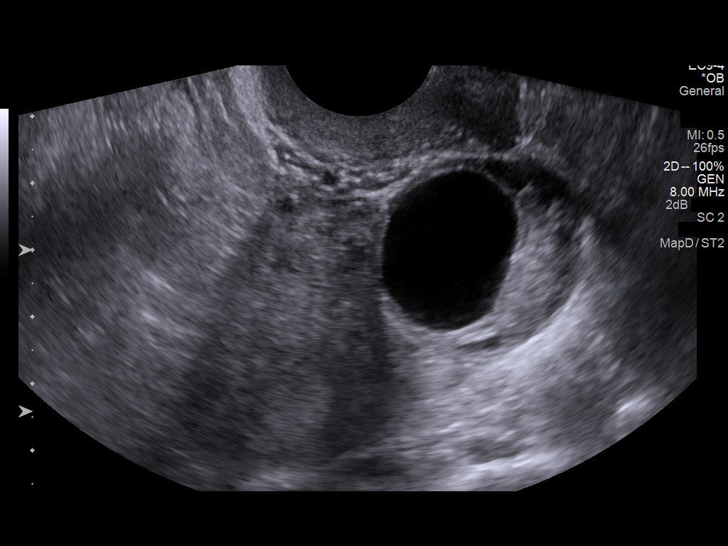
[im 31/42]
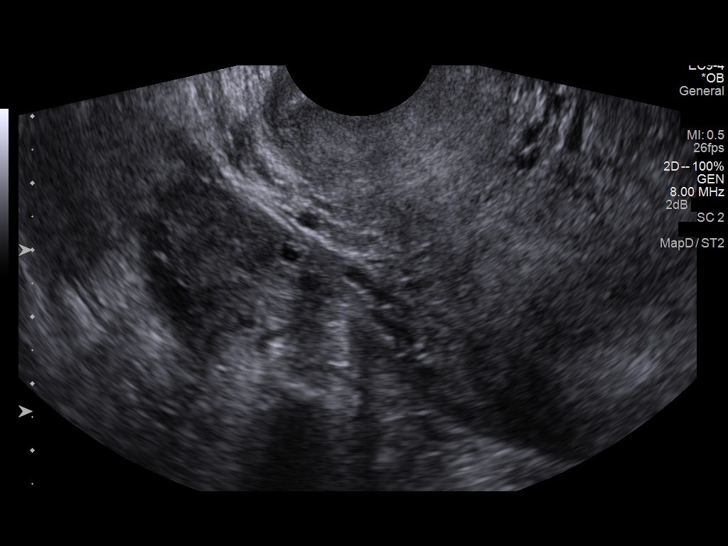
[im 34/42]
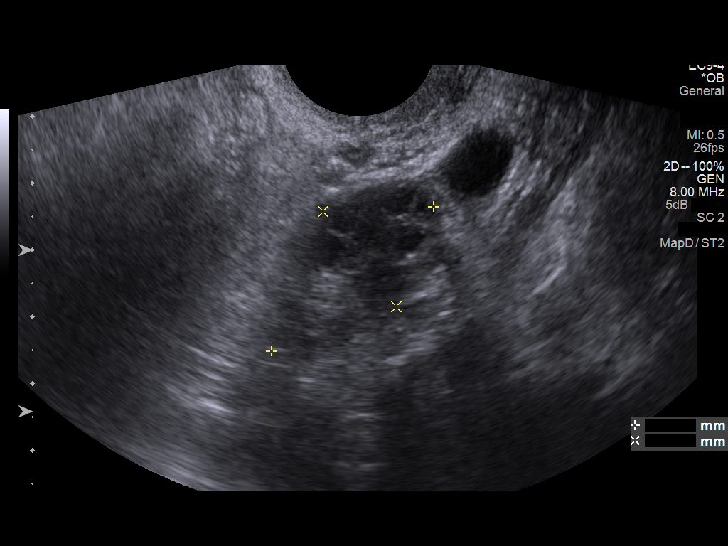
[im 37/42]
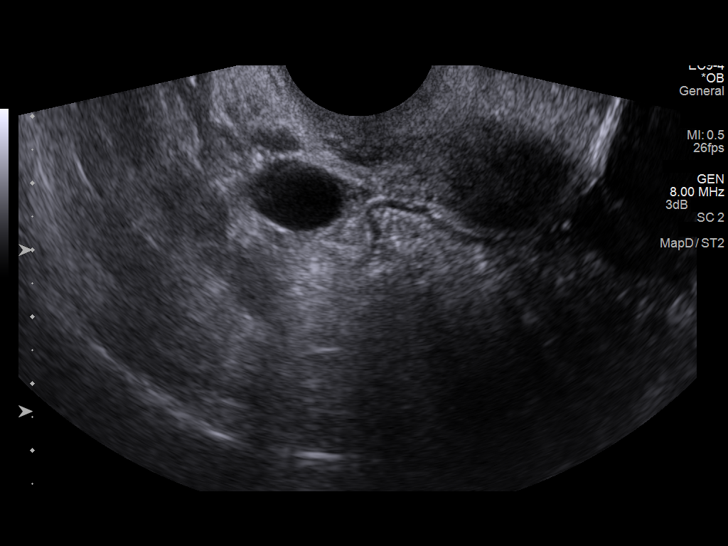
[im 40/42]
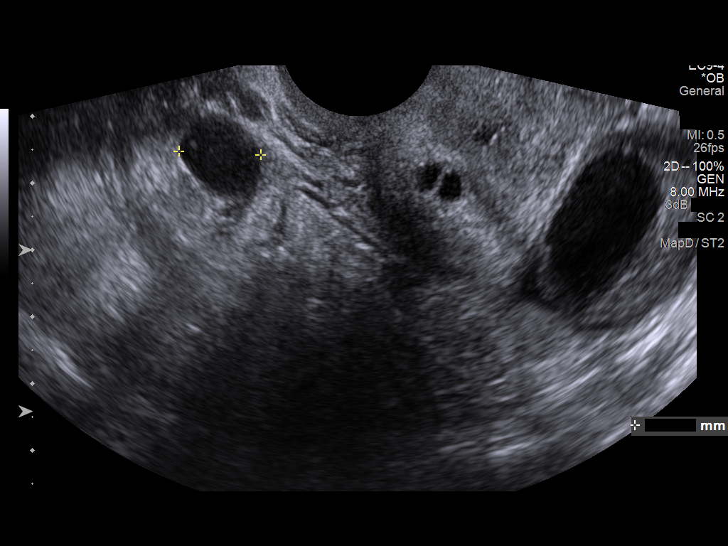

[13 of 28 positions shown; findings below may reference images not displayed]

FINDINGS: Intrauterine gestational sac: None seen.

Yolk sac:  N/A

Embryo:  N/A

Maternal uterus/adnexae: The uterus is unremarkable in appearance.
The endometrial echo complex is difficult to fully characterize. A
nabothian cyst is noted at the cervix.

The ovaries are within normal limits. The right ovary measures 3.2 x
3.1 x 1.8 cm, while the left ovary measures 3.6 x 2.6 x 3.5 cm. No
suspicious adnexal masses are seen; there is no evidence for ovarian
torsion.

A trace amount of free fluid is seen within the pelvic cul-de-sac.
IMPRESSION: No intrauterine gestational sac seen. This is compatible with the
patient's quantitative beta HCG level of less than 1, and reflects
interval spontaneous abortion. No evidence for ectopic pregnancy.

## 2018-04-16 LAB — OB RESULTS CONSOLE HGB/HCT, BLOOD
HCT: 42
HEMOGLOBIN: 13.8

## 2018-04-16 LAB — OB RESULTS CONSOLE RPR: RPR: NONREACTIVE

## 2018-04-16 LAB — OB RESULTS CONSOLE RUBELLA ANTIBODY, IGM: Rubella: IMMUNE

## 2018-04-16 LAB — OB RESULTS CONSOLE ABO/RH
ABO/RH(D): 0
RH Type: POSITIVE

## 2018-04-16 LAB — OB RESULTS CONSOLE GC/CHLAMYDIA
CHLAMYDIA, DNA PROBE: NEGATIVE
GC PROBE AMP, GENITAL: NEGATIVE

## 2018-04-16 LAB — OB RESULTS CONSOLE PLATELET COUNT: Platelets: 389

## 2018-04-16 LAB — CULTURE, OB URINE: URINE CULTURE, OB: NEGATIVE

## 2018-04-16 LAB — OB RESULTS CONSOLE ANTIBODY SCREEN: ANTIBODY SCREEN: NEGATIVE

## 2018-04-16 LAB — GLUCOSE, 1 HOUR: Glucose, 1 hour: 139

## 2018-04-16 LAB — OB RESULTS CONSOLE HEPATITIS B SURFACE ANTIGEN: Hepatitis B Surface Ag: NEGATIVE

## 2018-04-17 ENCOUNTER — Other Ambulatory Visit (HOSPITAL_COMMUNITY): Payer: Self-pay | Admitting: Family

## 2018-04-17 DIAGNOSIS — Z3A18 18 weeks gestation of pregnancy: Secondary | ICD-10-CM

## 2018-04-17 DIAGNOSIS — Z8279 Family history of other congenital malformations, deformations and chromosomal abnormalities: Secondary | ICD-10-CM

## 2018-04-17 DIAGNOSIS — Z363 Encounter for antenatal screening for malformations: Secondary | ICD-10-CM

## 2018-04-17 DIAGNOSIS — O09522 Supervision of elderly multigravida, second trimester: Secondary | ICD-10-CM

## 2018-04-21 ENCOUNTER — Ambulatory Visit: Payer: Self-pay | Admitting: *Deleted

## 2018-04-21 ENCOUNTER — Encounter: Payer: Self-pay | Attending: Obstetrics and Gynecology | Admitting: *Deleted

## 2018-04-21 ENCOUNTER — Encounter: Payer: Self-pay | Admitting: *Deleted

## 2018-04-21 DIAGNOSIS — R7302 Impaired glucose tolerance (oral): Secondary | ICD-10-CM

## 2018-04-21 DIAGNOSIS — Z713 Dietary counseling and surveillance: Secondary | ICD-10-CM | POA: Insufficient documentation

## 2018-04-21 NOTE — Progress Notes (Signed)
  Patient was seen on 04/21/2018 for Gestational Diabetes self-management. EDD 09/30/2018. Patient speaks Spanish, [employer] interpretor service used for this visit. Patient states no history of GDM. Diet history obtained. Patient eats fairly good variety of all food groups with some fast food meals maybe once a day, especially for breakfast on work days. 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 Beverages include sweet tea, regular soda and water.  The following learning objectives were met by the patient :   States the definition of Gestational Diabetes  States why dietary management is important in controlling blood glucose  Describes the effects of carbohydrates on blood glucose levels  Demonstrates ability to create a balanced meal plan  Demonstrates carbohydrate counting   States when to check blood glucose levels  Demonstrates proper blood glucose monitoring techniques  States the effect of stress and exercise on blood glucose levels  States the importance of limiting caffeine and abstaining from alcohol and smoking  Plan:  Aim for 3 Carb Choices per meal (45 grams) +/- 1 either way  Aim for 1-2 Carbs per snack Begin reading food labels for Total Carbohydrate of foods If OK with your MD, consider  increasing your activity level by walking, Arm Chair Exercises or other activity daily as tolerated Begin checking BG before breakfast and 2 hours after first bite of breakfast, lunch and dinner as directed by MD  Bring Log Book/Sheet to every medical appointment   Baby Scripts:  Patient not appropriate for Baby Scripts due to language barrier  Take medication if directed by MD  Blood glucose monitor given: True Track (she states she has applied for Medicaid but does not have her card yet. She was instructed to let staff know when she gets card so Accu Chek Meter Guide meter can be called into pharmacy) Lot # [id number] Exp:  11/14/2019 Blood glucose reading: 119 mg/dl @ 1.5 hours post breakfast   Patient instructed to monitor glucose levels: FBS: 60 - 95 mg/dl 2 hour: <120 mg/dl  Patient received the following handouts: in Spanish  Nutrition Diabetes and Pregnancy  Carbohydrate Counting List  BG Log Sheet  Patient will be seen for follow-up in 4 weeks due to early detection of GDM and as needed.

## 2018-04-22 ENCOUNTER — Encounter (HOSPITAL_COMMUNITY): Payer: Self-pay

## 2018-04-27 ENCOUNTER — Encounter (HOSPITAL_COMMUNITY): Payer: Self-pay

## 2018-04-29 ENCOUNTER — Ambulatory Visit (HOSPITAL_COMMUNITY)
Admission: RE | Admit: 2018-04-29 | Discharge: 2018-04-29 | Disposition: A | Discharge status: Home / Self Care | Payer: Self-pay | Source: Ambulatory Visit | Attending: Family | Admitting: Family

## 2018-04-29 ENCOUNTER — Encounter (HOSPITAL_COMMUNITY): Payer: Self-pay

## 2018-04-29 ENCOUNTER — Ambulatory Visit (HOSPITAL_COMMUNITY): Payer: Self-pay

## 2018-04-29 ENCOUNTER — Other Ambulatory Visit (HOSPITAL_COMMUNITY): Payer: Self-pay | Admitting: Family

## 2018-04-29 ENCOUNTER — Ambulatory Visit (HOSPITAL_COMMUNITY): Admission: RE | Admit: 2018-04-29 | Payer: Self-pay | Source: Ambulatory Visit

## 2018-04-29 ENCOUNTER — Encounter: Payer: Self-pay | Admitting: General Practice

## 2018-04-29 DIAGNOSIS — O99212 Obesity complicating pregnancy, second trimester: Secondary | ICD-10-CM

## 2018-04-29 DIAGNOSIS — Z8279 Family history of other congenital malformations, deformations and chromosomal abnormalities: Secondary | ICD-10-CM

## 2018-04-29 DIAGNOSIS — Z3A18 18 weeks gestation of pregnancy: Secondary | ICD-10-CM

## 2018-04-29 DIAGNOSIS — O24410 Gestational diabetes mellitus in pregnancy, diet controlled: Secondary | ICD-10-CM

## 2018-04-29 DIAGNOSIS — Z363 Encounter for antenatal screening for malformations: Secondary | ICD-10-CM | POA: Insufficient documentation

## 2018-04-29 DIAGNOSIS — Z3687 Encounter for antenatal screening for uncertain dates: Secondary | ICD-10-CM

## 2018-04-29 DIAGNOSIS — O09521 Supervision of elderly multigravida, first trimester: Secondary | ICD-10-CM

## 2018-04-29 DIAGNOSIS — Z3A12 12 weeks gestation of pregnancy: Secondary | ICD-10-CM | POA: Insufficient documentation

## 2018-04-29 DIAGNOSIS — O09522 Supervision of elderly multigravida, second trimester: Secondary | ICD-10-CM | POA: Insufficient documentation

## 2018-04-29 HISTORY — DX: Type 2 diabetes mellitus without complications: E11.9

## 2018-04-29 HISTORY — DX: Gestational diabetes mellitus in pregnancy, unspecified control: O24.419

## 2018-05-04 ENCOUNTER — Other Ambulatory Visit (HOSPITAL_COMMUNITY): Payer: Self-pay

## 2018-05-06 ENCOUNTER — Ambulatory Visit (INDEPENDENT_AMBULATORY_CARE_PROVIDER_SITE_OTHER): Payer: Self-pay | Admitting: Obstetrics and Gynecology

## 2018-05-06 ENCOUNTER — Encounter: Payer: Self-pay | Admitting: Obstetrics and Gynecology

## 2018-05-06 DIAGNOSIS — Z8632 Personal history of gestational diabetes: Secondary | ICD-10-CM

## 2018-05-06 DIAGNOSIS — O0992 Supervision of high risk pregnancy, unspecified, second trimester: Secondary | ICD-10-CM

## 2018-05-06 DIAGNOSIS — O24410 Gestational diabetes mellitus in pregnancy, diet controlled: Secondary | ICD-10-CM

## 2018-05-06 HISTORY — DX: Personal history of gestational diabetes: Z86.32

## 2018-05-06 MED ORDER — METFORMIN HCL 500 MG PO TABS: 500.0000 mg | ORAL_TABLET | Freq: Two times a day (BID) | ORAL | 5 refills | Status: DC

## 2018-05-06 NOTE — Patient Instructions (Signed)
Diagnstico de diabetes mellitus gestacional  (Gestational Diabetes Mellitus, Diagnosis)  La diabetes gestacional (diabetes mellitus gestacional) es una forma de diabetes a corto plazo (temporal) que puede aparecer durante el embarazo. Este cuadro desaparece despus del parto. Puede deberse a uno de estos problemas o a ambos:   El cuerpo no produce la cantidad suficiente de una hormona llamada insulina.   El cuerpo no responde de forma normal a la insulina que produce.  La insulina permite que los ciertos azcares (glucosa) ingresen a las clulas del cuerpo. Esto le proporciona la energa. Cuando se tiene diabetes, la glucosa no puede ingresar a las clulas. Esto produce un aumento del nivel de glucosa en la sangre (hiperglucemia).  Si la diabetes se trata, es posible que ni usted ni el beb se vean afectados. El mdico fijar los objetivos del tratamiento para usted. Generalmente, los resultados de la glucemia deben ser los siguientes:   Despus de no comer durante mucho tiempo (ayunar): 95mg/dl (5,3mmol/l).   Despus de las comidas (posprandial):  ? Una hora despus de una comida: igual o menor que 140mg/dl (7,8mmol/l).  ? Dos horas despus de una comida: igual o menor que 120mg/dl (6,7mmol/l).   Nivel de A1c (hemoglobinaA1c): del 6% al 6,5%.  CUIDADOS EN EL HOGAR  Preguntas para hacerle al mdico  Puede hacer las siguientes preguntas:   Debo reunirme con un instructor para el cuidado de la diabetes?   Dnde puedo encontrar un grupo de apoyo para personas diabticas?   Qu equipos necesitar para cuidarme en casa?   Qu medicamentos para la diabetes necesito? Cundo debo tomarlos?   Con qu frecuencia debo controlarme el nivel de glucosa en la sangre?   A qu nmero puedo llamar si tengo preguntas?   Cundo es la prxima cita con el mdico?  Instrucciones generales   Tome los medicamentos de venta libre y los recetados solamente como se lo haya indicado el mdico.   Mantenga un peso  saludable durante el embarazo.   Concurra a todas las visitas de control como se lo haya indicado el mdico. Esto es importante.  SOLICITE AYUDA SI:   Su nivel de glucosa en la sangre es igual o superior a 240mg/dl (13,3mmol/dl).   Su nivel de glucosa en la sangre es igual o superior a 200mg/dl (11,1mmol/l), y tiene cetonas en la orina.   Ha estado enferma o ha tenido fiebre durante 2o ms das y no mejora.   Si tiene alguno de estos problemas durante ms de 6horas:  ? No puede comer ni beber.  ? Siente malestar estomacal (nuseas).  ? Vomita.  ? La materia fecal es lquida (diarrea).  SOLICITE AYUDA DE INMEDIATO SI:   El nivel de glucosa en la sangre est por debajo de 54mg/dl (3mmol/l).   Est confundida.   Tiene dificultad para hacer lo siguiente:  ? Pensar con claridad.  ? Respirar.   El beb se mueve menos de lo normal.   Tiene los siguientes sntomas:  ? Niveles moderados o altos de cetonas en la orina.  ? Hemorragia vaginal.  ? Secrecin de un lquido fuera de lo comn de la vagina.  ? Contracciones prematuras. Estas pueden causar una sensacin de opresin en el vientre.  Esta informacin no tiene como fin reemplazar el consejo del mdico. Asegrese de hacerle al mdico cualquier pregunta que tenga.  Document Released: 12/25/2015 Document Revised: 12/25/2015 Document Reviewed: 10/06/2015  Elsevier Interactive Patient Education  2018 Elsevier Inc.

## 2018-05-06 NOTE — Progress Notes (Signed)
Subjective:  Lisa RocaRuth N Cortes-Pena is a 32 y.o. [id number] at 6269w1d being seen today for ongoing prenatal care.  [facility]. Pt has seen diabetic educator and checking her blood sugars. TSVD x 3 with a H/O GDM. EDD is by U/S. 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  Patient reports no complaints.  Contractions: Not present. Vag. Bleeding: None.  Movement: Absent. Denies leaking of fluid.   The following portions of the patient's history were reviewed and updated as appropriate: allergies, current medications, past family history, past medical history, past social history, past surgical history and problem list. Problem list updated.  Objective:   Vitals:   05/06/18 1542  BP: 127/87  Weight: 217 lb 9.6 oz (98.7 kg)    Fetal Status:     Movement: Absent     General:  Alert, oriented and cooperative. Patient is in no acute distress.  Skin: Skin is warm and dry. No rash noted.   Cardiovascular: Normal heart rate noted  Respiratory: Normal respiratory effort, no problems with respiration noted  Abdomen: Soft, gravid, appropriate for gestational age. Pain/Pressure: Absent     Pelvic:  Cervical exam deferred        Extremities: Normal range of motion.  Edema: None  Mental Status: Normal mood and affect. Normal behavior. Normal judgment and thought content.   Urinalysis:      Assessment and Plan:  Pregnancy: [id number] at 1869w1d  1. Supervision of high risk pregnancy, antepartum, second trimester Stable Prenatal labs and pap smear in [facility] records  2. Diet controlled gestational diabetes mellitus (GDM), antepartum CBG's reviewed with pt. Not in goal range. Insulin therapy recommended to pt. Pt  declined would like to try oral agent first. Pregnancy and DM reviewed with pt. Will start Metformin 500 mg bid - TSH - Hemoglobin A1c - metFORMIN (GLUCOPHAGE) 500 MG tablet; Take 1 tablet (500 mg total) by mouth 2 (two) times daily with a meal.  Dispense: 60 tablet; Refill: 5  Preterm labor symptoms and general obstetric precautions including but not limited to vaginal bleeding, contractions, leaking of fluid and fetal movement were reviewed in detail with the patient. Please refer to After Visit Summary for other counseling recommendations.  Return in about 2 weeks (around 05/20/2018) for OB visit.   Hermina StaggersErvin, Hilmar Moldovan L, MD

## 2018-05-07 LAB — HEMOGLOBIN A1C
ESTIMATED AVERAGE GLUCOSE: 137 mg/dL
HEMOGLOBIN A1C: 6.4 % — AB (ref 4.8–5.6)

## 2018-05-07 LAB — TSH: TSH: 0.137 u[IU]/mL — ABNORMAL LOW (ref 0.450–4.500)

## 2018-05-19 ENCOUNTER — Encounter: Payer: Self-pay | Attending: Obstetrics and Gynecology | Admitting: *Deleted

## 2018-05-19 ENCOUNTER — Ambulatory Visit: Payer: Self-pay | Admitting: *Deleted

## 2018-05-19 DIAGNOSIS — Z713 Dietary counseling and surveillance: Secondary | ICD-10-CM | POA: Insufficient documentation

## 2018-05-19 DIAGNOSIS — R7302 Impaired glucose tolerance (oral): Secondary | ICD-10-CM | POA: Insufficient documentation

## 2018-05-19 MED ORDER — GLUCOSE BLOOD VI STRP: ORAL_STRIP | 12 refills | Status: DC

## 2018-05-19 MED ORDER — ACCU-CHEK GUIDE W/DEVICE KIT: 1.0000 | PACK | Freq: Four times a day (QID) | 0 refills | Status: DC

## 2018-05-19 MED ORDER — ACCU-CHEK FASTCLIX LANCETS MISC: 1.0000 | Freq: Four times a day (QID) | 12 refills | Status: DC

## 2018-05-21 ENCOUNTER — Ambulatory Visit (INDEPENDENT_AMBULATORY_CARE_PROVIDER_SITE_OTHER): Payer: Self-pay | Admitting: Obstetrics & Gynecology

## 2018-05-21 VITALS — BP 113/70 | HR 81 | Wt 215.7 lb

## 2018-05-21 DIAGNOSIS — O0992 Supervision of high risk pregnancy, unspecified, second trimester: Secondary | ICD-10-CM

## 2018-05-21 DIAGNOSIS — O24410 Gestational diabetes mellitus in pregnancy, diet controlled: Secondary | ICD-10-CM

## 2018-05-21 MED ORDER — GLYBURIDE 2.5 MG PO TABS: 2.5000 mg | ORAL_TABLET | Freq: Two times a day (BID) | ORAL | 3 refills | Status: DC

## 2018-05-21 NOTE — Progress Notes (Signed)
   PRENATAL VISIT NOTE  Subjective:  Shawna [name] is a 32 y.o. [phone] at [redacted]w[redacted]d being seen today for ongoing prenatal care.  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  Patient reports nausea, vomiting and when taking metformin.  Contractions: Not present. Vag. Bleeding: None.  Movement: Absent. Denies leaking of fluid.   The following portions of the patient's history were reviewed and updated as appropriate: allergies, current medications, past family history, past medical history, past social history, past surgical history and problem list. Problem list updated.  Objective:   Vitals:   05/21/18 1436  BP: 113/70  Pulse: 81  Weight: 215 lb 11.2 oz (97.8 kg)    Fetal Status: Fetal Heart Rate (bpm): 160 Fundal Height: 16 cm Movement: Absent     General:  Alert, oriented and cooperative. Patient is in no acute distress.  Skin: Skin is warm and dry. No rash noted.   Cardiovascular: Normal heart rate noted  Respiratory: Normal respiratory effort, no problems with respiration noted  Abdomen: Soft, gravid, appropriate for gestational age.  Pain/Pressure: Absent     Pelvic: Cervical exam deferred        Extremities: Normal range of motion.     Mental Status: Normal mood and affect. Normal behavior. Normal judgment and thought content.   Assessment and Plan:  Pregnancy: [id number] at [redacted]w[redacted]d  1. Supervision of high risk pregnancy, antepartum, second trimester Need [place], AFP when 16+  2. Diet controlled gestational diabetes mellitus (GDM), antepartum Not tolerating metformin change medication - glyBURIDE (DIABETA) 2.5 MG tablet; Take 1 tablet (2.5 mg total) by mouth 2 (two) times daily with a meal.  Dispense: 60 tablet;  Refill: 3  Preterm labor symptoms and general obstetric precautions including but not limited to vaginal bleeding, contractions, leaking of fluid and fetal movement were reviewed in detail with the patient. Please refer to After Visit Summary for other counseling recommendations.  Return in about 2 weeks (around 06/04/2018).  Future Appointments  Date Time Provider Department Center  06/25/2018  4:00 PM [facility]    Scheryl Darter, MD

## 2018-05-21 NOTE — Patient Instructions (Signed)
Segundo trimestre de embarazo (Second Trimester of Pregnancy) El segundo trimestre va desde la semana13 hasta la 28, desde el cuarto hasta el sexto mes, y suele ser el momento en el que mejor se siente. En general, las nuseas matutinas han disminuido o han desaparecido completamente. Tendr ms energa y podr aumentarle el apetito. El beb por nacer (feto) se desarrolla rpidamente. Hacia el final del sexto mes, el beb mide aproximadamente 9 pulgadas (23 cm) y pesa alrededor de 1 libras (700 g). Es probable que sienta al beb moverse (dar pataditas) entre las 18 y 20 semanas del embarazo. CUIDADOS EN EL HOGAR  No fume, no consuma hierbas ni beba alcohol. No tome frmacos que el mdico no haya autorizado.  No consuma ningn producto que contenga tabaco, lo que incluye cigarrillos, tabaco de mascar o cigarrillos electrnicos. Si necesita ayuda para dejar de fumar, consulte al mdico. Puede recibir asesoramiento u otro tipo de apoyo para dejar de fumar.  Tome los medicamentos solamente como se lo haya indicado el mdico. Algunos medicamentos son seguros para tomar durante el embarazo y otros no lo son.  Haga ejercicios solamente como se lo haya indicado el mdico. Interrumpa la actividad fsica si comienza a tener calambres.  Ingiera alimentos saludables de manera regular.  Use un sostn que le brinde buen soporte si sus mamas estn sensibles.  No se d baos de inmersin en agua caliente, baos turcos ni saunas.  Colquese el cinturn de seguridad cuando conduzca.  No coma carne cruda ni queso sin cocinar; evite el contacto con las bandejas sanitarias de los gatos y la tierra que estos animales usan.  Tome las vitaminas prenatales.  Tome entre 1500 y 2000mg de calcio diariamente comenzando en la semana20 del embarazo hasta el parto.  Pruebe tomar un medicamento que la ayude a defecar (un laxante suave) si el mdico lo autoriza. Consuma ms fibra, que se encuentra en las frutas y  verduras frescas y los cereales integrales. Beba suficiente lquido para mantener el pis (orina) claro o de color amarillo plido.  Dese baos de asiento con agua tibia para aliviar el dolor o las molestias causadas por las hemorroides. Use una crema para las hemorroides si el mdico la autoriza.  Si se le hinchan las venas (venas varicosas), use medias de descanso. Levante (eleve) los pies durante 15minutos, 3 o 4veces por da. Limite el consumo de sal en su dieta.  No levante objetos pesados, use zapatos de tacones bajos y sintese derecha.  Descanse con las piernas elevadas si tiene calambres o dolor de cintura.  Visite a su dentista si no lo ha hecho durante el embarazo. Use un cepillo de cerdas suaves para cepillarse los dientes. Psese el hilo dental con suavidad.  Puede seguir manteniendo relaciones sexuales, a menos que el mdico le indique lo contrario.  Concurra a los controles mdicos.  SOLICITE AYUDA SI:  Siente mareos.  Sufre calambres o presin leves en la parte baja del vientre (abdomen).  Sufre un dolor persistente en el abdomen.  Tiene malestar estomacal (nuseas), vmitos, o tiene deposiciones acuosas (diarrea).  Advierte un olor ftido que proviene de la vagina.  Siente dolor al orinar.  SOLICITE AYUDA DE INMEDIATO SI:  Tiene fiebre.  Tiene una prdida de lquido por la vagina.  Tiene sangrado o pequeas prdidas vaginales.  Siente dolor intenso o clicos en el abdomen.  Sube o baja de peso rpidamente.  Tiene dificultades para recuperar el aliento y siente dolor en el pecho.  Sbitamente se   le hinchan mucho el rostro, las manos, los tobillos, los pies o las piernas.  No ha sentido los movimientos del beb durante una hora.  Siente un dolor de cabeza intenso que no se alivia con medicamentos.  Su visin se modifica.  Esta informacin no tiene como fin reemplazar el consejo del mdico. Asegrese de hacerle al mdico cualquier pregunta que  tenga. Document Released: 05/05/2013 Document Revised: 09/23/2014 Document Reviewed: 11/03/2012 Elsevier Interactive Patient Education  2017 Elsevier Inc.  

## 2018-05-21 NOTE — Progress Notes (Signed)
States taking metformin but it causing diarrhea and has not been taking bid.

## 2018-05-21 NOTE — Progress Notes (Signed)
  Patient was seen on 05/19/2018 for Gestational Diabetes self-management follow up visit. EDD 09/30/2018. Patient speaks Spanish, [employer] interpretor service used for this visit.   Patient brought BG Log Sheet with her, fasting BG's 50 % above target range, post meal BG's only 1-2 BG's are above 120 for past week. Per diet history, patient is eating small supper and a snack of fruit or cereal at night (no protein).   Also of note, Metformin was prescribed at last MD visit @ 500 mg BID with breakfast and supper but patient reports only taking the bedtime dose   Patient complaining of pain with finger sticks, I encouraged her to adjust the tip from a #3 down to a #2 to help with this.  Patient reports she now has Medicaid, so we will call in Rx for Accu Chek Guide meter with Fast Clix drums. She repquests pharmacy change from [facility] to [facility] on [address].  Interjection:  Explained rationale of at least 2 carb choices for supper and 1-2 at evening snack along with good protein choice   Instructed patient to start taking breakfast dose of Metformin as directed and to alert MD that she has only been taking the evening dose for the past week.   Showed patient the Accu Chek Guide meter and how to use the Fast Clix lancing device with drums  Plan:  Aim for 3 Carb Choices per meal (45 grams) +/- 1 either way  Aim for 1-2 Carbs per snack with protein especially at night Continue reading food labels for Total Carbohydrate of foods If OK with your MD, consider  increasing your activity level by walking, Arm Chair Exercises or other activity daily as tolerated Continue checking BG before breakfast and 2 hours after first bite of breakfast, lunch and dinner as directed by MD  Bring Log Book/Sheet to every medical appointment   Baby Scripts:  Patient not appropriate for Baby Scripts due to language barrier  Take medication as directed by MD  Rx for Accu Chek Guide with strips and Fast Clix  drums called into [facility] on [facility]  Patient instructed to monitor glucose levels: FBS: 60 - 95 mg/dl 2 hour: <588 mg/dl  Patient received the following handouts: in Spanish  No new handouts today  Patient will be seen for follow-up in 4 weeks due to early detection of GDM and as needed.

## 2018-06-04 ENCOUNTER — Ambulatory Visit (INDEPENDENT_AMBULATORY_CARE_PROVIDER_SITE_OTHER): Payer: Self-pay | Admitting: Family Medicine

## 2018-06-04 VITALS — BP 120/82 | HR 78 | Wt 217.0 lb

## 2018-06-04 DIAGNOSIS — O0992 Supervision of high risk pregnancy, unspecified, second trimester: Secondary | ICD-10-CM

## 2018-06-04 DIAGNOSIS — W273XXA Contact with needle (sewing), initial encounter: Secondary | ICD-10-CM

## 2018-06-04 DIAGNOSIS — Z23 Encounter for immunization: Secondary | ICD-10-CM

## 2018-06-04 DIAGNOSIS — O24415 Gestational diabetes mellitus in pregnancy, controlled by oral hypoglycemic drugs: Secondary | ICD-10-CM

## 2018-06-04 DIAGNOSIS — S61239A Puncture wound without foreign body of unspecified finger without damage to nail, initial encounter: Secondary | ICD-10-CM

## 2018-06-04 DIAGNOSIS — IMO0001 Reserved for inherently not codable concepts without codable children: Secondary | ICD-10-CM

## 2018-06-04 LAB — POCT URINALYSIS DIP (DEVICE)
Bilirubin Urine: NEGATIVE
Glucose, UA: NEGATIVE mg/dL
Hgb urine dipstick: NEGATIVE
Ketones, ur: NEGATIVE mg/dL
Nitrite: NEGATIVE
PH: 6 (ref 5.0–8.0)
PROTEIN: NEGATIVE mg/dL
Specific Gravity, Urine: 1.02 (ref 1.005–1.030)
UROBILINOGEN UA: 0.2 mg/dL (ref 0.0–1.0)

## 2018-06-04 MED ORDER — ASPIRIN EC 81 MG PO TBEC: 81.0000 mg | DELAYED_RELEASE_TABLET | Freq: Every day | ORAL | 2 refills | Status: DC

## 2018-06-04 NOTE — Progress Notes (Signed)
Pain on right abdomen, at night, x3 days then goes away. "stabbing pian"

## 2018-06-04 NOTE — Progress Notes (Signed)
Subjective:  Shawna [name] is a 32 y.o. [id number] at 3966w2d being seen today for ongoing prenatal care.  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  GDM: Patient taking glyburide.  Reports no hypoglycemic episodes.  Tolerating medication well. Forgot logbook, but reports: Fasting: less than 95 2hr PP: <106  Patient reports no complaints. Had needle stick at work. Would like to know if she's immune to Hep B. Contractions: Not present. Vag. Bleeding: None.  Movement: Present. Denies leaking of fluid.   The following portions of the patient's history were reviewed and updated as appropriate: allergies, current medications, past family history, past medical history, past social history, past surgical history and problem list. Problem list updated.  Objective:   Vitals:   06/04/18 1517  BP: 120/82  Pulse: 78  Weight: 217 lb (98.4 kg)    Fetal Status: Fetal Heart Rate (bpm): 145   Movement: Present     General:  Alert, oriented and cooperative. Patient is in no acute distress.  Skin: Skin is warm and dry. No rash noted.   Cardiovascular: Normal heart rate noted  Respiratory: Normal respiratory effort, no problems with respiration noted  Abdomen: Soft, gravid, appropriate for gestational age. Pain/Pressure: Present     Pelvic: Vag. Bleeding: None     Cervical exam deferred        Extremities: Normal range of motion.  Edema: None  Mental Status: Normal mood and affect. Normal behavior. Normal judgment and thought content.   Urinalysis:      Assessment and Plan:  Pregnancy: [id number] at 3266w2d  1. Supervision of high risk pregnancy, antepartum, second trimester FHT and Fh normal, AFP today. US on 10/3.  2. Gestational  diabetes mellitus (GDM) in second trimester controlled on oral hypoglycemic drug Continue glyburide Will order Fetal echo ASA 81mg   3. Needlestick injury of finger, initial encounter Tdap today. - Hepatitis B surface antibody,quantitative   Preterm labor symptoms and general obstetric precautions including but not limited to vaginal bleeding, contractions, leaking of fluid and fetal movement were reviewed in detail with the patient. Please refer to After Visit Summary for other counseling recommendations.  No follow-ups on file.   Levie HeritageStinson, Tambi Thole J, DO

## 2018-06-06 LAB — AFP, SERUM, OPEN SPINA BIFIDA
AFP MoM: 1.3
AFP Value: 39.6 ng/mL
GEST. AGE ON COLLECTION DATE: 17.2 wk
Maternal Age At EDD: 33.7 yr
OSBR Risk 1 IN: 4803
TEST RESULTS AFP: NEGATIVE
WEIGHT: 217 [lb_av]

## 2018-06-06 LAB — HEPATITIS B SURFACE ANTIBODY, QUANTITATIVE

## 2018-06-08 ENCOUNTER — Telehealth: Payer: Self-pay

## 2018-06-08 ENCOUNTER — Encounter: Payer: Self-pay | Admitting: *Deleted

## 2018-06-08 NOTE — Telephone Encounter (Signed)
-----   Message from Levie HeritageJacob J Stinson, DO sent at 06/08/2018 11:18 AM EDT ----- The patient had a needle stick at work and requested HepB immunity testing. Please let her know that she is not immune to Hep B.

## 2018-06-08 NOTE — Telephone Encounter (Signed)
Called Pt with Shawna KitchensKathryn [name] CMN, to explain results. Pt verbalized understanding and had no questions.

## 2018-06-08 NOTE — Telephone Encounter (Signed)
Called pt with Shawna [name], CNM , She explained results and Pt verbalized understanding and had no questions.

## 2018-06-11 ENCOUNTER — Encounter (HOSPITAL_COMMUNITY): Payer: Self-pay

## 2018-06-11 ENCOUNTER — Encounter: Payer: Self-pay | Admitting: *Deleted

## 2018-06-18 ENCOUNTER — Ambulatory Visit (HOSPITAL_COMMUNITY)
Admission: RE | Admit: 2018-06-18 | Discharge: 2018-06-18 | Disposition: A | Discharge status: Home / Self Care | Payer: Self-pay | Source: Ambulatory Visit | Attending: Obstetrics & Gynecology | Admitting: Obstetrics & Gynecology

## 2018-06-18 ENCOUNTER — Encounter (HOSPITAL_COMMUNITY): Payer: Self-pay

## 2018-06-18 DIAGNOSIS — O0992 Supervision of high risk pregnancy, unspecified, second trimester: Secondary | ICD-10-CM | POA: Insufficient documentation

## 2018-06-18 DIAGNOSIS — O24410 Gestational diabetes mellitus in pregnancy, diet controlled: Secondary | ICD-10-CM | POA: Insufficient documentation

## 2018-06-18 DIAGNOSIS — O99212 Obesity complicating pregnancy, second trimester: Secondary | ICD-10-CM

## 2018-06-18 DIAGNOSIS — Z363 Encounter for antenatal screening for malformations: Secondary | ICD-10-CM

## 2018-06-18 DIAGNOSIS — O24415 Gestational diabetes mellitus in pregnancy, controlled by oral hypoglycemic drugs: Secondary | ICD-10-CM

## 2018-06-18 DIAGNOSIS — Z3A19 19 weeks gestation of pregnancy: Secondary | ICD-10-CM | POA: Insufficient documentation

## 2018-06-22 ENCOUNTER — Other Ambulatory Visit (HOSPITAL_COMMUNITY): Payer: Self-pay | Admitting: *Deleted

## 2018-06-22 DIAGNOSIS — O24415 Gestational diabetes mellitus in pregnancy, controlled by oral hypoglycemic drugs: Secondary | ICD-10-CM

## 2018-06-23 ENCOUNTER — Other Ambulatory Visit: Payer: Self-pay

## 2018-06-25 ENCOUNTER — Ambulatory Visit: Payer: Self-pay | Admitting: *Deleted

## 2018-06-25 ENCOUNTER — Encounter: Payer: Self-pay | Attending: Obstetrics and Gynecology | Admitting: *Deleted

## 2018-06-25 DIAGNOSIS — Z713 Dietary counseling and surveillance: Secondary | ICD-10-CM | POA: Insufficient documentation

## 2018-06-25 DIAGNOSIS — O24415 Gestational diabetes mellitus in pregnancy, controlled by oral hypoglycemic drugs: Secondary | ICD-10-CM

## 2018-06-25 DIAGNOSIS — R7302 Impaired glucose tolerance (oral): Secondary | ICD-10-CM | POA: Insufficient documentation

## 2018-06-25 MED ORDER — "INSULIN SYRINGE 31G X 5/16"" 1 ML MISC": 1.0000 | Freq: Three times a day (TID) | 1 refills | Status: DC

## 2018-06-25 MED ORDER — INSULIN NPH (HUMAN) (ISOPHANE) 100 UNIT/ML ~~LOC~~ SUSP: 20.0000 [IU] | Freq: Two times a day (BID) | SUBCUTANEOUS | 11 refills | Status: DC

## 2018-06-25 NOTE — Progress Notes (Signed)
  Patient was seen on 06/25/2018 for Gestational Diabetes self-management follow up visit. EDD 09/30/2018. Patient speaks Spanish, Stratus interpretor service used for first half of this visit and live interpretor for 2nd half due to her availability and complexity of visit.   Patient brought BG Log Sheet with her, fasting BG's 80 % above target range, (87-116 with only 2 below 95) and post meal BG's are averaging 80-138 with about half running under 120 mg/dl. She is currently on Glyburide BID for past several weeks. Per diet history, patient is eating a limit of 30 grams or less per meal and a snack of fruit. She is hungry and states she has lost weight.   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 I explained that increased activity does reduce BG's.  I discussed this patient with Dr. Catalina Antigua and we agreed that she should start on insulin today. She has an MD appointment next week with Dr. Alysia Penna so we can see how she does with a baseline insulin this week and be able to adjust next week as needed. Starting dose to be 20 units NPH (Relion brand) before breakfast and before supper. Syringe size to be 1 ml so if she needs to mix in the future it will be adequate for potential doses over 50 units. She was instructed to stop the Glyburide as she starts the insulin.  Instruction:  Explained rationale of at least 2 carb choices for supper and 1-2 at evening snack along with good protein choice   I taught patient how to inject single dose of insulin with syringe, she was able to express understanding with return demonstration  Storage of insulin also covered today. Plan to teach how to mix next week if needed.   Plan:  Aim for 3 Carb Choices per meal (45 grams) +/- 1 either way  Aim for 1-2 Carbs per snack with protein especially at night Continue reading food labels for Total Carbohydrate of foods If OK with your MD, consider  increasing  your activity level by walking, Arm Chair Exercises or other activity daily as tolerated Continue checking BG before breakfast and 2 hours after first bite of breakfast, lunch and dinner as directed by MD  Bring Log Book/Sheet to every medical appointment   Baby Scripts:  Patient not appropriate for Baby Scripts due to language barrier  Take medication as directed by MD - you are starting insulin today @ 20 units NPH before breakfast and supper. Stop taking the Glyburide  Patient instructed to monitor glucose levels: FBS: 60 - 95 mg/dl 2 hour: <161 mg/dl  Patient received the following handouts:  Insulin Instruction handout  Patient will be seen for follow-up in 1 week due to starting on insulin. Will coordinate with MD appointment already scheduled

## 2018-07-02 ENCOUNTER — Encounter: Payer: Self-pay | Admitting: *Deleted

## 2018-07-02 ENCOUNTER — Ambulatory Visit (INDEPENDENT_AMBULATORY_CARE_PROVIDER_SITE_OTHER): Payer: Self-pay | Admitting: Obstetrics and Gynecology

## 2018-07-02 ENCOUNTER — Ambulatory Visit: Payer: Self-pay | Admitting: *Deleted

## 2018-07-02 ENCOUNTER — Encounter: Payer: Self-pay | Admitting: Obstetrics and Gynecology

## 2018-07-02 VITALS — BP 112/62 | HR 79 | Wt 217.3 lb

## 2018-07-02 DIAGNOSIS — N898 Other specified noninflammatory disorders of vagina: Secondary | ICD-10-CM

## 2018-07-02 DIAGNOSIS — O24414 Gestational diabetes mellitus in pregnancy, insulin controlled: Secondary | ICD-10-CM

## 2018-07-02 DIAGNOSIS — Z113 Encounter for screening for infections with a predominantly sexual mode of transmission: Secondary | ICD-10-CM

## 2018-07-02 DIAGNOSIS — O0992 Supervision of high risk pregnancy, unspecified, second trimester: Secondary | ICD-10-CM

## 2018-07-02 DIAGNOSIS — Z23 Encounter for immunization: Secondary | ICD-10-CM

## 2018-07-02 MED ORDER — INSULIN NPH (HUMAN) (ISOPHANE) 100 UNIT/ML ~~LOC~~ SUSP: 20.0000 [IU] | Freq: Two times a day (BID) | SUBCUTANEOUS | 3 refills | Status: DC

## 2018-07-02 NOTE — Patient Instructions (Signed)

## 2018-07-02 NOTE — Progress Notes (Signed)
Fetal echo scheduled 10/22 @ 11am

## 2018-07-02 NOTE — Progress Notes (Signed)
  Patient was seen on 07/02/2018 for Gestational Diabetes self-management follow up visit. EDD 09/30/2018. Patient speaks Spanish, Financial controller for this visit.  Patient states when she went to [facility] to pick up NPH insulin last week they said it was $100 which she could not afford. So she did not get it. She also did not pick up the Glipizide Rx because we had told her to stop it when she started on the insulin. So she has had no diabetes medication this past week.    She states she has not been checking her BG either, when asked why she said because she didn't have the insulin.   I called the [facility] to see why the ReliOn NPH insulin wasn't offered when she couldn't afford the name brand and was told that it depended on the pharmacist whether it would be offered or not. I was also told that ReliOn insulins; Regular, NPH and 70/30 do NOT require a Rx, they can be bought over the counter @ $24.99. Syringes do NOT require a Rx either, they are about $13 for a box of 100.  I shared this information with Dr. Alysia Penna, who she has a appointment with in about an hour. It was decided to again start her on NPH at 20 units before both breakfast and supper.   She continues to eat 2 carb choices per meal, occasionally a 3rd. 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   Instruction:  Patient was able to express understanding with return demonstration again this week.  Plan to teach how to mix in the future only if needed.   Plan:  Aim for 3 Carb Choices per meal (45 grams) +/- 1 either way  Aim for 1-2 Carbs per snack with protein especially at night Continue reading food labels for Total Carbohydrate of foods If OK with your MD, consider  increasing your activity level by walking, Arm Chair Exercises or other activity daily as tolerated Continue checking BG before breakfast and 2 hours after first bite of breakfast, lunch  and dinner as directed by MD  Bring Log Book/Sheet to every medical appointment   Baby Scripts:  Patient not appropriate for Baby Scripts due to language barrier  Take medication as directed by MD - you are starting insulin today @ 20 units NPH before breakfast and supper. Stop taking the Glyburide  Patient instructed to monitor glucose levels: FBS: 60 - 95 mg/dl 2 hour: <540 mg/dl  Patient received the following handouts:  No new handouts today  Patient will be seen for follow-up in 2 weeks either by MD or with me due to starting on insulin .

## 2018-07-02 NOTE — Progress Notes (Signed)
Subjective:  Shawna [name] is a 32 y.o. [id number] at [redacted]w[redacted]d being seen today for ongoing prenatal care.  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  Patient reports no complaints.  Contractions: Not present. Vag. Bleeding: None.  Movement: Present. Denies leaking of fluid.   The following portions of the patient's history were reviewed and updated as appropriate: allergies, current medications, past family history, past medical history, past social history, past surgical history and problem list. Problem list updated.  Objective:   Vitals:   07/02/18 1520  BP: 112/62  Pulse: 79  Weight: 217 lb 4.8 oz (98.6 kg)    Fetal Status: Fetal Heart Rate (bpm): 153   Movement: Present     General:  Alert, oriented and cooperative. Patient is in no acute distress.  Skin: Skin is warm and dry. No rash noted.   Cardiovascular: Normal heart rate noted  Respiratory: Normal respiratory effort, no problems with respiration noted  Abdomen: Soft, gravid, appropriate for gestational age. Pain/Pressure: Present     Pelvic:  Cervical exam deferred        Extremities: Normal range of motion.  Edema: None  Mental Status: Normal mood and affect. Normal behavior. Normal judgment and thought content.   Urinalysis:      Assessment and Plan:  Pregnancy: [id number] at [redacted]w[redacted]d  1. Supervision of high risk pregnancy, antepartum, second trimester Stable - Flu Vaccine QUAD 36+ mos IM  2. Insulin controlled gestational diabetes mellitus (GDM) in second trimester Has not started insulin therapy. Insulin has been sent to pharmacy NPH 20 units bid Fetal Echo ordered   Preterm labor symptoms and general obstetric precautions including but not  limited to vaginal bleeding, contractions, leaking of fluid and fetal movement were reviewed in detail with the patient. Please refer to After Visit Summary for other counseling recommendations.  Return in about 2 weeks (around 07/16/2018) for OB visit.   Hermina Staggers, MD

## 2018-07-03 NOTE — Addendum Note (Signed)
Addended by: Henrietta Dine on: 07/03/2018 12:17 PM   Modules accepted: Orders

## 2018-07-06 LAB — CERVICOVAGINAL ANCILLARY ONLY
BACTERIAL VAGINITIS: NEGATIVE
CANDIDA VAGINITIS: NEGATIVE
CHLAMYDIA, DNA PROBE: NEGATIVE
NEISSERIA GONORRHEA: NEGATIVE
TRICH (WINDOWPATH): NEGATIVE

## 2018-07-16 ENCOUNTER — Encounter: Payer: Self-pay | Admitting: Family Medicine

## 2018-07-16 ENCOUNTER — Encounter (HOSPITAL_COMMUNITY): Payer: Self-pay

## 2018-07-16 ENCOUNTER — Ambulatory Visit (HOSPITAL_COMMUNITY)
Admission: RE | Admit: 2018-07-16 | Discharge: 2018-07-16 | Disposition: A | Discharge status: Home / Self Care | Payer: Self-pay | Source: Ambulatory Visit | Attending: Obstetrics & Gynecology | Admitting: Obstetrics & Gynecology

## 2018-07-16 DIAGNOSIS — Z3A23 23 weeks gestation of pregnancy: Secondary | ICD-10-CM | POA: Insufficient documentation

## 2018-07-16 DIAGNOSIS — Z362 Encounter for other antenatal screening follow-up: Secondary | ICD-10-CM

## 2018-07-16 DIAGNOSIS — O0992 Supervision of high risk pregnancy, unspecified, second trimester: Secondary | ICD-10-CM

## 2018-07-16 DIAGNOSIS — O24414 Gestational diabetes mellitus in pregnancy, insulin controlled: Secondary | ICD-10-CM

## 2018-07-16 DIAGNOSIS — O99212 Obesity complicating pregnancy, second trimester: Secondary | ICD-10-CM

## 2018-07-16 DIAGNOSIS — Z794 Long term (current) use of insulin: Secondary | ICD-10-CM | POA: Insufficient documentation

## 2018-07-16 DIAGNOSIS — O24415 Gestational diabetes mellitus in pregnancy, controlled by oral hypoglycemic drugs: Secondary | ICD-10-CM | POA: Insufficient documentation

## 2018-07-17 ENCOUNTER — Other Ambulatory Visit (HOSPITAL_COMMUNITY): Payer: Self-pay | Admitting: *Deleted

## 2018-07-17 DIAGNOSIS — O24414 Gestational diabetes mellitus in pregnancy, insulin controlled: Secondary | ICD-10-CM

## 2018-07-30 ENCOUNTER — Ambulatory Visit: Payer: Self-pay | Admitting: *Deleted

## 2018-07-30 ENCOUNTER — Ambulatory Visit (INDEPENDENT_AMBULATORY_CARE_PROVIDER_SITE_OTHER): Payer: Self-pay | Admitting: Family Medicine

## 2018-07-30 ENCOUNTER — Encounter: Payer: Self-pay | Attending: Obstetrics and Gynecology | Admitting: *Deleted

## 2018-07-30 VITALS — BP 113/64 | HR 72 | Wt 218.0 lb

## 2018-07-30 DIAGNOSIS — O0992 Supervision of high risk pregnancy, unspecified, second trimester: Secondary | ICD-10-CM

## 2018-07-30 DIAGNOSIS — O24414 Gestational diabetes mellitus in pregnancy, insulin controlled: Secondary | ICD-10-CM

## 2018-07-30 DIAGNOSIS — R7302 Impaired glucose tolerance (oral): Secondary | ICD-10-CM | POA: Insufficient documentation

## 2018-07-30 DIAGNOSIS — Z713 Dietary counseling and surveillance: Secondary | ICD-10-CM | POA: Insufficient documentation

## 2018-07-30 DIAGNOSIS — N898 Other specified noninflammatory disorders of vagina: Secondary | ICD-10-CM

## 2018-07-30 LAB — POCT URINALYSIS DIP (DEVICE)
BILIRUBIN URINE: NEGATIVE
Glucose, UA: NEGATIVE mg/dL
HGB URINE DIPSTICK: NEGATIVE
Ketones, ur: NEGATIVE mg/dL
LEUKOCYTES UA: NEGATIVE
Nitrite: NEGATIVE
Protein, ur: NEGATIVE mg/dL
SPECIFIC GRAVITY, URINE: 1.02 (ref 1.005–1.030)
Urobilinogen, UA: 0.2 mg/dL (ref 0.0–1.0)
pH: 7 (ref 5.0–8.0)

## 2018-07-30 MED ORDER — TERCONAZOLE 0.8 % VA CREA: 1.0000 | TOPICAL_CREAM | Freq: Every day | VAGINAL | 0 refills | Status: DC

## 2018-07-30 NOTE — Progress Notes (Signed)
   PRENATAL VISIT NOTE  Subjective:  Shawna RocaRuth N Cortes-Pena is a 32 y.o. [phone] at 5439w2d being seen today for ongoing prenatal care.  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  Patient reports vaginal irritation. Left side, negative wet prep last visit  Contractions: Not present. Vag. Bleeding: None.  Movement: Present. Denies leaking of fluid.   The following portions of the patient's history were reviewed and updated as appropriate: allergies, current medications, past family history, past medical history, past social history, past surgical history and problem list. Problem list updated.  Objective:   Vitals:   07/30/18 1325  BP: 113/64  Pulse: 72  Weight: 218 lb (98.9 kg)    Fetal Status: Fetal Heart Rate (bpm): 146 Fundal Height: 28 cm Movement: Present     General:  Alert, oriented and cooperative. Patient is in no acute distress.  Skin: Skin is warm and dry. No rash noted.   Cardiovascular: Normal heart rate noted  Respiratory: Normal respiratory effort, no problems with respiration noted  Abdomen: Soft, gravid, appropriate for gestational age.  Pain/Pressure: Absent     Pelvic: Cervical exam deferred      Vagina with some white plaque noted at top of mons  Extremities: Normal range of motion.  Edema: None  Mental Status: Normal mood and affect. Normal behavior. Normal judgment and thought content.   Assessment and Plan:  Pregnancy: [id number] at 7139w2d  1. Supervision of high risk pregnancy, antepartum, second trimester   2. Insulin controlled gestational diabetes mellitus (GDM) in second trimester FBS 70-95 2 hour pp 80-113 Meter reviewed and most are in range, but fewer values and do not always agree with  meter. Continue Insulin--c/o bruising at injection site  3. Vaginal itching Trial of anti-fungal, consider high potency steroid if ineffective - terconazole (TERAZOL 3) 0.8 % vaginal cream; Place 1 applicator vaginally at bedtime.  Dispense: 20 g; Refill: 0  Preterm labor symptoms and general obstetric precautions including but not limited to vaginal bleeding, contractions, leaking of fluid and fetal movement were reviewed in detail with the patient. Please refer to After Visit Summary for other counseling recommendations.  Return in 2 weeks (on 08/13/2018).  Future Appointments  Date Time Provider Department Center  08/12/2018  1:15 PM Arvilla MarketWallace, Catherine Lauren, DO [facility]  08/12/2018  3:30 PM [facility] US 1 [facility]  08/26/2018  1:15 PM Allie Bossierove, Myra C, MD [facility]  09/10/2018  1:15 PM Hermina StaggersErvin, Michael L, MD [facility]    Reva Boresanya S Rishika Mccollom, MD

## 2018-07-30 NOTE — Patient Instructions (Signed)
 Lactancia materna Breastfeeding Decidir amamantar es una de las mejores elecciones que puede hacer por usted y su beb. Un cambio en las hormonas durante el embarazo hace que las mamas produzcan leche materna en las glndulas productoras de leche. Las hormonas impiden que la leche materna sea liberada antes del nacimiento del beb. Adems, impulsan el flujo de leche luego del nacimiento. Una vez que ha comenzado a amamantar, pensar en el beb, as como la succin o el llanto, pueden estimular la liberacin de leche de las glndulas productoras de leche. Los beneficios de amamantar Las investigaciones demuestran que la lactancia materna ofrece muchos beneficios de salud para bebs y madres. Adems, ofrece una forma gratuita y conveniente de alimentar al beb. Para el beb  La primera leche (calostro) ayuda a mejorar el funcionamiento del aparato digestivo del beb.  Las clulas especiales de la leche (anticuerpos) ayudan a combatir las infecciones en el beb.  Los bebs que se alimentan con leche materna tambin tienen menos probabilidades de tener asma, alergias, obesidad o diabetes de tipo 2. Adems, tienen menor riesgo de sufrir el sndrome de muerte sbita del lactante (SMSL).  Los nutrientes de la leche materna son mejores para satisfacer las necesidades del beb en comparacin con la leche maternizada.  La leche materna mejora el desarrollo cerebral del beb. Para usted  La lactancia materna favorece el desarrollo de un vnculo muy especial entre la madre y el beb.  Es conveniente. La leche materna es econmica y siempre est disponible a la temperatura correcta.  La lactancia materna ayuda a quemar caloras. Le ayuda a perder el peso ganado durante el embarazo.  Hace que el tero vuelva al tamao que tena antes del embarazo ms rpido. Adems, disminuye el sangrado (loquios) despus del parto.  La lactancia materna contribuye a reducir el riesgo de tener diabetes de tipo 2,  osteoporosis, artritis reumatoide, enfermedades cardiovasculares y cncer de mama, ovario, tero y endometrio en el futuro. Informacin bsica sobre la lactancia Comienzo de la lactancia  Encuentre un lugar cmodo para sentarse o acostarse, con un buen respaldo para el cuello y la espalda.  Coloque una almohada o una manta enrollada debajo del beb para acomodarlo a la altura de la mama (si est sentada). Las almohadas para amamantar se han diseado especialmente a fin de servir de apoyo para los brazos y el beb mientras amamanta.  Asegrese de que la barriga del beb (abdomen) est frente a la suya.  Masajee suavemente la mama. Con las yemas de los dedos, masajee los bordes exteriores de la mama hacia adentro, en direccin al pezn. Esto estimula el flujo de leche. Si la leche fluye lentamente, es posible que deba continuar con este movimiento durante la lactancia.  Sostenga la mama con 4 dedos por debajo y el pulgar por arriba del pezn (forme la letra "C" con la mano). Asegrese de que los dedos se encuentren lejos del pezn y de la boca del beb.  Empuje suavemente los labios del beb con el pezn o con el dedo.  Cuando la boca del beb se abra lo suficiente, acrquelo rpidamente a la mama e introduzca todo el pezn y la arola, tanto como sea posible, dentro de la boca del beb. La arola es la zona de color que rodea al pezn. ? Debe haber ms arola visible por arriba del labio superior del beb que por debajo del labio inferior. ? Los labios del beb deben estar abiertos y extendidos hacia afuera (evertidos) para asegurar que   el beb se prenda de forma adecuada y cmoda. ? La lengua del beb debe estar entre la enca inferior y la mama.  Asegrese de que la boca del beb est en la posicin correcta alrededor del pezn (prendido). Los labios del beb deben crear un sello sobre la mama y estar doblados hacia afuera (invertidos).  Es comn que el beb succione durante 2 a 3 minutos  para que comience el flujo de leche materna. Cmo debe prenderse Es muy importante que le ensee al beb cmo prenderse adecuadamente a la mama. Si el beb no se prende adecuadamente, puede causar dolor en los pezones, reducir la produccin de leche materna y hacer que el beb tenga un escaso aumento de peso. Adems, si el beb no se prende adecuadamente al pezn, puede tragar aire durante la alimentacin. Esto puede causarle molestias al beb. Hacer eructar al beb al cambiar de mama puede ayudarlo a liberar el aire. Sin embargo, ensearle al beb cmo prenderse a la mama adecuadamente es la mejor manera de evitar que se sienta molesto por tragar aire mientras se alimenta. Signos de que el beb se ha prendido adecuadamente al pezn  Tironea o succiona de modo silencioso, sin causarle dolor. Los labios del beb deben estar extendidos hacia afuera (evertidos).  Se escucha que traga cada 3 o 4 succiones una vez que la leche ha comenzado a fluir (despus de que se produzca el reflejo de eyeccin de la leche).  Hay movimientos musculares por arriba y por delante de sus odos al succionar.  Signos de que el beb no se ha prendido adecuadamente al pezn  Hace ruidos de succin o de chasquido mientras se alimenta.  Siente dolor en los pezones.  Si cree que el beb no se prendi correctamente, deslice el dedo en la comisura de la boca y colquelo entre las encas del beb para interrumpir la succin. Intente volver a comenzar a amamantar. Signos de lactancia materna exitosa Signos del beb  El beb disminuir gradualmente el nmero de succiones o dejar de succionar por completo.  El beb se quedar dormido.  El cuerpo del beb se relajar.  El beb retendr una pequea cantidad de leche en la boca.  El beb se desprender solo del pecho.  Signos que presenta usted  Las mamas han aumentado la firmeza, el peso y el tamao 1 a 3 horas despus de amamantar.  Estn ms blandas inmediatamente  despus de amamantar.  Se producen un aumento del volumen de leche y un cambio en su consistencia y color hacia el quinto da de lactancia.  Los pezones no duelen, no estn agrietados ni sangran.  Signos de que su beb recibe la cantidad de leche suficiente  Mojar por lo menos 1 o 2paales durante las primeras 24horas despus del nacimiento.  Mojar por lo menos 5 o 6paales cada 24horas durante la primera semana despus del nacimiento. La orina debe ser clara o de color amarillo plido a los 5das de vida.  Mojar entre 6 y 8paales cada 24horas a medida que el beb sigue creciendo y desarrollndose.  Defeca por lo menos 3 veces en 24 horas a los 5 das de vida. Las heces deben ser blandas y amarillentas.  Defeca por lo menos 3 veces en 24 horas a los 7 das de vida. Las heces deben ser grumosas y amarillentas.  No registra una prdida de peso mayor al 10% del peso al nacer durante los primeros 3 das de vida.  Aumenta de peso un   promedio de 4 a 7onzas (113 a 198g) por semana despus de los 4 das de vida.  Aumenta de peso, diariamente, de manera uniforme a partir de los 5 das de vida, sin registrar prdida de peso despus de las 2semanas de vida. Despus de alimentarse, es posible que el beb regurgite una pequea cantidad de leche. Esto es normal. Frecuencia y duracin de la lactancia El amamantamiento frecuente la ayudar a producir ms leche y puede prevenir dolores en los pezones y las mamas extremadamente llenas (congestin mamaria). Alimente al beb cuando muestre signos de hambre o si siente la necesidad de reducir la congestin de las mamas. Esto se denomina "lactancia a demanda". Las seales de que el beb tiene hambre incluyen las siguientes:  Aumento del estado de alerta, actividad o inquietud.  Mueve la cabeza de un lado a otro.  Abre la boca cuando se le toca la mejilla o la comisura de la boca (reflejo de bsqueda).  Aumenta las vocalizaciones, tales como  sonidos de succin, se relame los labios, emite arrullos, suspiros o chirridos.  Mueve la mano hacia la boca y se chupa los dedos o las manos.  Est molesto o llora.  Evite el uso del chupete en las primeras 4 a 6 semanas despus del nacimiento del beb. Despus de este perodo, podr usar un chupete. Las investigaciones demostraron que el uso del chupete durante el primer ao de vida del beb disminuye el riesgo de tener el sndrome de muerte sbita del lactante (SMSL). Permita que el nio se alimente en cada mama todo lo que desee. Cuando el beb se desprende o se queda dormido mientras se est alimentando de la primera mama, ofrzcale la segunda. Debido a que, con frecuencia, los recin nacidos estn somnolientos las primeras semanas de vida, es posible que deba despertar al beb para alimentarlo. Los horarios de lactancia varan de un beb a otro. Sin embargo, las siguientes reglas pueden servir como gua para ayudarla a garantizar que el beb se alimenta adecuadamente:  Se puede amamantar a los recin nacidos (bebs de 4 semanas o menos de vida) cada 1 a 3 horas.  No deben transcurrir ms de 3 horas durante el da o 5 horas durante la noche sin que se amamante a los recin nacidos.  Debe amamantar al beb un mnimo de 8 veces en un perodo de 24 horas.  Extraccin de leche materna La extraccin y el almacenamiento de la leche materna le permiten asegurarse de que el beb se alimente exclusivamente de su leche materna, aun en momentos en los que no puede amamantar. Esto tiene especial importancia si debe regresar al trabajo en el perodo en que an est amamantando o si no puede estar presente en los momentos en que el beb debe alimentarse. Su asesor en lactancia puede ayudarla a encontrar un mtodo de extraccin que funcione mejor para usted y orientarla sobre cunto tiempo es seguro almacenar leche materna. Cmo cuidar las mamas durante la lactancia Los pezones pueden secarse, agrietarse y  doler durante la lactancia. Las siguientes recomendaciones pueden ayudarla a mantener las mamas humectadas y sanas:  Evite usar jabn en los pezones.  Use un sostn de soporte diseado especialmente para la lactancia materna. Evite usar sostenes con aro o sostenes muy ajustados (sostenes deportivos).  Seque al aire sus pezones durante 3 a 4minutos despus de amamantar al beb.  Utilice solo apsitos de algodn en el sostn para absorber las prdidas de leche. La prdida de un poco de leche materna   entre las tomas es normal.  Utilice lanolina sobre los pezones luego de amamantar. La lanolina ayuda a mantener la humedad normal de la piel. La lanolina pura no es perjudicial (no es txica) para el beb. Adems, puede extraer manualmente algunas gotas de leche materna y masajear suavemente esa leche sobre los pezones para que la leche se seque al aire.  Durante las primeras semanas despus del nacimiento, algunas mujeres experimentan congestin mamaria. La congestin mamaria puede hacer que sienta las mamas pesadas, calientes y sensibles al tacto. El pico de la congestin mamaria ocurre en el plazo de los 3 a 5 das despus del parto. Las siguientes recomendaciones pueden ayudarla a aliviar la congestin mamaria:  Vace por completo las mamas al amamantar o extraer leche. Puede aplicar calor hmedo en las mamas (en la ducha o con toallas hmedas para manos) antes de amamantar o extraer leche. Esto aumenta la circulacin y ayuda a que la leche fluya. Si el beb no vaca por completo las mamas cuando lo amamanta, extraiga la leche restante despus de que haya finalizado.  Aplique compresas de hielo sobre las mamas inmediatamente despus de amamantar o extraer leche, a menos que le resulte demasiado incmodo. Haga lo siguiente: ? Ponga el hielo en una bolsa plstica. ? Coloque una toalla entre la piel y la bolsa de hielo. ? Coloque el hielo durante 20minutos, 2 o 3veces por da.  Asegrese de que el  beb est prendido y se encuentre en la posicin correcta mientras lo alimenta.  Si la congestin mamaria persiste luego de 48 horas o despus de seguir estas recomendaciones, comunquese con su mdico o un asesor en lactancia. Recomendaciones de salud general durante la lactancia  Consuma 3 comidas y 3 colaciones saludables todos los das. Las madres bien alimentadas que amamantan necesitan entre 450 y 500 caloras adicionales por da. Puede cumplir con este requisito al aumentar la cantidad de una dieta equilibrada que realice.  Beba suficiente agua para mantener la orina clara o de color amarillo plido.  Descanse con frecuencia, reljese y siga tomando sus vitaminas prenatales para prevenir la fatiga, el estrs y los niveles bajos de vitaminas y minerales en el cuerpo (deficiencias de nutrientes).  No consuma ningn producto que contenga nicotina o tabaco, como cigarrillos y cigarrillos electrnicos. El beb puede verse afectado por las sustancias qumicas de los cigarrillos que pasan a la leche materna y por la exposicin al humo ambiental del tabaco. Si necesita ayuda para dejar de fumar, consulte al mdico.  Evite el consumo de alcohol.  No consuma drogas ilegales o marihuana.  Antes de usar cualquier medicamento, hable con el mdico. Estos incluyen medicamentos recetados y de venta libre, como tambin vitaminas y suplementos a base de hierbas. Algunos medicamentos, que pueden ser perjudiciales para el beb, pueden pasar a travs de la leche materna.  Puede quedar embarazada durante la lactancia. Si se desea un mtodo anticonceptivo, consulte al mdico sobre cules son las opciones seguras durante la lactancia. Dnde encontrar ms informacin: Liga internacional La Leche: www.llli.org. Comunquese con un mdico si:  Siente que quiere dejar de amamantar o se siente frustrada con la lactancia.  Sus pezones estn agrietados o sangran.  Sus mamas estn irritadas, sensibles o  calientes.  Tiene los siguientes sntomas: ? Dolor en las mamas o en los pezones. ? Un rea hinchada en cualquiera de las mamas. ? Fiebre o escalofros. ? Nuseas o vmitos. ? Drenaje de otro lquido distinto de la leche materna desde los pezones.    Sus mamas no se llenan antes de amamantar al beb para el quinto da despus del parto.  Se siente triste y deprimida.  El beb: ? Est demasiado somnoliento como para comer bien. ? Tiene problemas para dormir. ? Tiene ms de 1 semana de vida y moja menos de 6 paales en un periodo de 24 horas. ? No ha aumentado de peso a los 5 das de vida.  El beb defeca menos de 3 veces en 24 horas.  La piel del beb o las partes blancas de los ojos se vuelven amarillentas. Solicite ayuda de inmediato si:  El beb est muy cansado (letargo) y no se quiere despertar para comer.  Le sube la fiebre sin causa. Resumen  La lactancia materna ofrece muchos beneficios de salud para bebs y madres.  Intente amamantar a su beb cuando muestre signos tempranos de hambre.  Haga cosquillas o empuje suavemente los labios del beb con el dedo o el pezn para lograr que el beb abra la boca. Acerque el beb a la mama. Asegrese de que la mayor parte de la arola se encuentre dentro de la boca del beb. Ofrzcale una mama y haga eructar al beb antes de pasar a la otra.  Hable con su mdico o asesor en lactancia si tiene dudas o problemas con la lactancia. Esta informacin no tiene como fin reemplazar el consejo del mdico. Asegrese de hacerle al mdico cualquier pregunta que tenga. Document Released: 09/02/2005 Document Revised: 12/23/2016 Document Reviewed: 12/23/2016 Elsevier Interactive Patient Education  2018 [employer].  

## 2018-07-30 NOTE — Progress Notes (Signed)
Pt reports itching in vaginal area with white discharge.

## 2018-07-30 NOTE — Progress Notes (Signed)
  Patient was seen on 07/30/2018 for Gestational Diabetes self-management follow up visit. EDD 09/30/2018. 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  Patient has already seen Dr. Gildardo Griffes. Pratt today who reports that all numbers in her Log Book do not match her meter readings. She also states the patient reports bruising with her injections.   Patient reports to me that when she checks her BG at work, she cannot write it down right then, so she tries to remember the number and writes it down later. I showed her how to review the history of her BG's so she can put the correct numbers on her sheet. In reviewing her historical averages, her 14 day average = 90 and her 30 day average = 96 mg/dl.   We also discussed the bruising, she states she is putting the needle straight in and is rotating her sites. I suggested she use an ice cube on her skin first to deepen the blood vessels below the skin and see if that helps.     Patient instructed to monitor glucose levels: FBS: 60 - 95 mg/dl 2 hour: <161<120 mg/dl  Patient received the following handouts:  No new handouts today  Patient will be seen for follow-up PRN with me as she has multiple MD appointments already scheduled.

## 2018-08-12 ENCOUNTER — Encounter (HOSPITAL_COMMUNITY): Payer: Self-pay

## 2018-08-12 ENCOUNTER — Ambulatory Visit (HOSPITAL_COMMUNITY)
Admission: RE | Admit: 2018-08-12 | Discharge: 2018-08-12 | Disposition: A | Discharge status: Home / Self Care | Payer: Self-pay | Source: Ambulatory Visit | Attending: Obstetrics & Gynecology | Admitting: Obstetrics & Gynecology

## 2018-08-12 ENCOUNTER — Ambulatory Visit (INDEPENDENT_AMBULATORY_CARE_PROVIDER_SITE_OTHER): Payer: Self-pay | Admitting: Internal Medicine

## 2018-08-12 VITALS — BP 110/73 | HR 74 | Wt 216.4 lb

## 2018-08-12 DIAGNOSIS — O0992 Supervision of high risk pregnancy, unspecified, second trimester: Secondary | ICD-10-CM

## 2018-08-12 DIAGNOSIS — Z3A27 27 weeks gestation of pregnancy: Secondary | ICD-10-CM

## 2018-08-12 DIAGNOSIS — O99212 Obesity complicating pregnancy, second trimester: Secondary | ICD-10-CM

## 2018-08-12 DIAGNOSIS — Z362 Encounter for other antenatal screening follow-up: Secondary | ICD-10-CM

## 2018-08-12 DIAGNOSIS — O24414 Gestational diabetes mellitus in pregnancy, insulin controlled: Secondary | ICD-10-CM

## 2018-08-12 LAB — POCT URINALYSIS DIP (DEVICE)
GLUCOSE, UA: NEGATIVE mg/dL
HGB URINE DIPSTICK: NEGATIVE
Nitrite: NEGATIVE
Protein, ur: 30 mg/dL — AB
SPECIFIC GRAVITY, URINE: 1.025 (ref 1.005–1.030)
UROBILINOGEN UA: 1 mg/dL (ref 0.0–1.0)
pH: 5.5 (ref 5.0–8.0)

## 2018-08-12 MED ORDER — INSULIN NPH (HUMAN) (ISOPHANE) 100 UNIT/ML ~~LOC~~ SUSP: 22.0000 [IU] | Freq: Two times a day (BID) | SUBCUTANEOUS | 3 refills | Status: DC

## 2018-08-12 NOTE — Progress Notes (Signed)
   PRENATAL VISIT NOTE  Subjective:  Shawna [name] is a 32 y.o. [phone] at [redacted]w[redacted]d being seen today for ongoing prenatal care.  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  Patient reports no complaints.  Contractions: Not present. Vag. Bleeding: None.  Movement: Present. Denies leaking of fluid.   The following portions of the patient's history were reviewed and updated as appropriate: allergies, current medications, past family history, past medical history, past social history, past surgical history and problem list. Problem list updated.  Objective:   Vitals:   08/12/18 1311  BP: 110/73  Pulse: 74  Weight: 216 lb 6.4 oz (98.2 kg)    Fetal Status: Fetal Heart Rate (bpm): 151   Movement: Present     General:  Alert, oriented and cooperative. Patient is in no acute distress.  Skin: Skin is warm and dry. No rash noted.   Cardiovascular: Normal heart rate noted  Respiratory: Normal respiratory effort, no problems with respiration noted  Abdomen: Soft, gravid, appropriate for gestational age.  Pain/Pressure: Absent     Pelvic: Cervical exam deferred        Extremities: Normal range of motion.  Edema: None  Mental Status: Normal mood and affect. Normal behavior. Normal judgment and thought content.   Assessment and Plan:  Pregnancy: [id number] at 1644w1d  1. Supervision of high risk pregnancy, antepartum, second trimester - CBC - HIV Antibody (routine testing w rflx) - RPR  2. Insulin controlled gestational diabetes mellitus (GDM) in second trimester Patient measuring large for GA but has growth scan scheduled for this afternoon. Insulin log reviewed. Majority of fasting CBGs between 90-110. Lowest CBG 70. Majority  of 2hr postprandial CBGs 110-130. Will increase insulin to 22u BID. Patient to continue recording CBGs.    Preterm labor symptoms and general obstetric precautions including but not limited to vaginal bleeding, contractions, leaking of fluid and fetal movement were reviewed in detail with the patient. Please refer to After Visit Summary for other counseling recommendations.  Return in about 2 weeks (around 08/26/2018) for high risk OB.  Future Appointments  Date Time Provider Department Center  08/12/2018  3:30 PM [facility] US 1 [facility]  08/26/2018  1:15 PM Allie Bossierove, Myra C, MD [facility]  09/10/2018  1:15 PM Hermina StaggersErvin, Michael L, [facility]    De Hollingsheadatherine L Wallace, DO

## 2018-08-13 LAB — CBC
Hematocrit: 35.2 % (ref 34.0–46.6)
Hemoglobin: 12.2 g/dL (ref 11.1–15.9)
MCH: 31.6 pg (ref 26.6–33.0)
MCHC: 34.7 g/dL (ref 31.5–35.7)
MCV: 91 fL (ref 79–97)
PLATELETS: 377 10*3/uL (ref 150–450)
RBC: 3.86 x10E6/uL (ref 3.77–5.28)
RDW: 13.2 % (ref 12.3–15.4)
WBC: 7 10*3/uL (ref 3.4–10.8)

## 2018-08-13 LAB — HIV ANTIBODY (ROUTINE TESTING W REFLEX): HIV Screen 4th Generation wRfx: NONREACTIVE

## 2018-08-13 LAB — RPR: RPR Ser Ql: NONREACTIVE

## 2018-08-17 ENCOUNTER — Other Ambulatory Visit (HOSPITAL_COMMUNITY): Payer: Self-pay | Admitting: *Deleted

## 2018-08-17 DIAGNOSIS — Z3689 Encounter for other specified antenatal screening: Secondary | ICD-10-CM

## 2018-08-26 ENCOUNTER — Ambulatory Visit (INDEPENDENT_AMBULATORY_CARE_PROVIDER_SITE_OTHER): Payer: Self-pay | Admitting: Obstetrics & Gynecology

## 2018-08-26 VITALS — BP 109/72 | HR 70 | Wt 216.5 lb

## 2018-08-26 DIAGNOSIS — O24414 Gestational diabetes mellitus in pregnancy, insulin controlled: Secondary | ICD-10-CM

## 2018-08-26 DIAGNOSIS — O0992 Supervision of high risk pregnancy, unspecified, second trimester: Secondary | ICD-10-CM

## 2018-08-26 NOTE — Progress Notes (Signed)
   PRENATAL VISIT NOTE  Subjective:  Shawna [name] is a 32 y.o. [phone] at 7140w1d being seen today for ongoing prenatal care.  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  Patient reports no complaints.  Contractions: Not present. Vag. Bleeding: None.  Movement: Present. Denies leaking of fluid.   The following portions of the patient's history were reviewed and updated as appropriate: allergies, current medications, past family history, past medical history, past social history, past surgical history and problem list. Problem list updated.  Objective:   Vitals:   08/26/18 1316  BP: 109/72  Pulse: 70  Weight: 216 lb 8 oz (98.2 kg)    Fetal Status: Fetal Heart Rate (bpm): 159   Movement: Present     General:  Alert, oriented and cooperative. Patient is in no acute distress.  Skin: Skin is warm and dry. No rash noted.   Cardiovascular: Normal heart rate noted  Respiratory: Normal respiratory effort, no problems with respiration noted  Abdomen: Soft, gravid, appropriate for gestational age.  Pain/Pressure: Present     Pelvic: Cervical exam deferred        Extremities: Normal range of motion.  Edema: None  Mental Status: Normal mood and affect. Normal behavior. Normal judgment and thought content.   Assessment and Plan:  Pregnancy: [id number] at 5340w1d  1. Supervision of high risk pregnancy, antepartum, second trimester - start weekly BPP at 32 weeks  2. Insulin controlled gestational diabetes mellitus (GDM) in third trimester - sugars reasonable - follow up MFM u/s on 09-10-18 - she had her fetal echo on 07/07/18  Preterm labor symptoms and general obstetric precautions including but not limited to vaginal  bleeding, contractions, leaking of fluid and fetal movement were reviewed in detail with the patient. Please refer to After Visit Summary for other counseling recommendations.  No follow-ups on file.  Future Appointments  Date Time Provider Department Center  09/10/2018  1:15 PM Hermina StaggersErvin[facility] L[facility]  09/10/2018  3:15 PM [facility] US 4 [facility]    Allie BossierMyra C Staley Lunz, MD

## 2018-09-10 ENCOUNTER — Encounter: Payer: Self-pay | Admitting: Obstetrics and Gynecology

## 2018-09-10 ENCOUNTER — Encounter (HOSPITAL_COMMUNITY): Payer: Self-pay

## 2018-09-10 ENCOUNTER — Ambulatory Visit (INDEPENDENT_AMBULATORY_CARE_PROVIDER_SITE_OTHER): Payer: Self-pay | Admitting: Obstetrics and Gynecology

## 2018-09-10 ENCOUNTER — Ambulatory Visit (HOSPITAL_COMMUNITY)
Admission: RE | Admit: 2018-09-10 | Discharge: 2018-09-10 | Disposition: A | Discharge status: Home / Self Care | Payer: Self-pay | Source: Ambulatory Visit | Attending: Obstetrics & Gynecology | Admitting: Obstetrics & Gynecology

## 2018-09-10 VITALS — BP 126/79 | HR 85 | Wt 217.0 lb

## 2018-09-10 DIAGNOSIS — O24414 Gestational diabetes mellitus in pregnancy, insulin controlled: Secondary | ICD-10-CM

## 2018-09-10 DIAGNOSIS — Z3689 Encounter for other specified antenatal screening: Secondary | ICD-10-CM

## 2018-09-10 DIAGNOSIS — Z3A31 31 weeks gestation of pregnancy: Secondary | ICD-10-CM | POA: Insufficient documentation

## 2018-09-10 DIAGNOSIS — O99212 Obesity complicating pregnancy, second trimester: Secondary | ICD-10-CM

## 2018-09-10 DIAGNOSIS — O99213 Obesity complicating pregnancy, third trimester: Secondary | ICD-10-CM | POA: Insufficient documentation

## 2018-09-10 DIAGNOSIS — Z87798 Personal history of other (corrected) congenital malformations: Secondary | ICD-10-CM | POA: Insufficient documentation

## 2018-09-10 DIAGNOSIS — O0992 Supervision of high risk pregnancy, unspecified, second trimester: Secondary | ICD-10-CM

## 2018-09-10 MED ORDER — ACCU-CHEK FASTCLIX LANCETS MISC: 1.0000 | Freq: Four times a day (QID) | 12 refills | Status: DC

## 2018-09-10 MED ORDER — ACCU-CHEK GUIDE W/DEVICE KIT: 1.0000 | PACK | Freq: Four times a day (QID) | 0 refills | Status: DC

## 2018-09-10 NOTE — Patient Instructions (Signed)
Third Trimester of Pregnancy The third trimester is from week 28 through week 40 (months 7 through 9). The third trimester is a time when the unborn baby (fetus) is growing rapidly. At the end of the ninth month, the fetus is about 20 inches in length and weighs 6-10 pounds. Body changes during your third trimester Your body will continue to go through many changes during pregnancy. The changes vary from woman to woman. During the third trimester:  Your weight will continue to increase. You can expect to gain 25-35 pounds (11-16 kg) by the end of the pregnancy.  You may begin to get stretch marks on your hips, abdomen, and breasts.  You may urinate more often because the fetus is moving lower into your pelvis and pressing on your bladder.  You may develop or continue to have heartburn. This is caused by increased hormones that slow down muscles in the digestive tract.  You may develop or continue to have constipation because increased hormones slow digestion and cause the muscles that push waste through your intestines to relax.  You may develop hemorrhoids. These are swollen veins (varicose veins) in the rectum that can itch or be painful.  You may develop swollen, bulging veins (varicose veins) in your legs.  You may have increased body aches in the pelvis, back, or thighs. This is due to weight gain and increased hormones that are relaxing your joints.  You may have changes in your hair. These can include thickening of your hair, rapid growth, and changes in texture. Some women also have hair loss during or after pregnancy, or hair that feels dry or thin. Your hair will most likely return to normal after your baby is born.  Your breasts will continue to grow and they will continue to become tender. A yellow fluid (colostrum) may leak from your breasts. This is the first milk you are producing for your baby.  Your belly button may stick out.  You may notice more swelling in your hands,  face, or ankles.  You may have increased tingling or numbness in your hands, arms, and legs. The skin on your belly may also feel numb.  You may feel short of breath because of your expanding uterus.  You may have more problems sleeping. This can be caused by the size of your belly, increased need to urinate, and an increase in your body's metabolism.  You may notice the fetus "dropping," or moving lower in your abdomen (lightening).  You may have increased vaginal discharge.  You may notice your joints feel loose and you may have pain around your pelvic bone. What to expect at prenatal visits You will have prenatal exams every 2 weeks until week 36. Then you will have weekly prenatal exams. During a routine prenatal visit:  You will be weighed to make sure you and the baby are growing normally.  Your blood pressure will be taken.  Your abdomen will be measured to track your baby's growth.  The fetal heartbeat will be listened to.  Any test results from the previous visit will be discussed.  You may have a cervical check near your due date to see if your cervix has softened or thinned (effaced).  You will be tested for Group B streptococcus. This happens between 35 and 37 weeks. Your health care provider may ask you:  What your birth plan is.  How you are feeling.  If you are feeling the baby move.  If you have had any abnormal   symptoms, such as leaking fluid, bleeding, severe headaches, or abdominal cramping.  If you are using any tobacco products, including cigarettes, chewing tobacco, and electronic cigarettes.  If you have any questions. Other tests or screenings that may be performed during your third trimester include:  Blood tests that check for low iron levels (anemia).  Fetal testing to check the health, activity level, and growth of the fetus. Testing is done if you have certain medical conditions or if there are problems during the pregnancy.  Nonstress test  (NST). This test checks the health of your baby to make sure there are no signs of problems, such as the baby not getting enough oxygen. During this test, a belt is placed around your belly. The baby is made to move, and its heart rate is monitored during movement. What is false labor? False labor is a condition in which you feel small, irregular tightenings of the muscles in the womb (contractions) that usually go away with rest, changing position, or drinking water. These are called Braxton Hicks contractions. Contractions may last for hours, days, or even weeks before true labor sets in. If contractions come at regular intervals, become more frequent, increase in intensity, or become painful, you should see your health care provider. What are the signs of labor?  Abdominal cramps.  Regular contractions that start at 10 minutes apart and become stronger and more frequent with time.  Contractions that start on the top of the uterus and spread down to the lower abdomen and back.  Increased pelvic pressure and dull back pain.  A watery or bloody mucus discharge that comes from the vagina.  Leaking of amniotic fluid. This is also known as your "water breaking." It could be a slow trickle or a gush. Let your health care provider know if it has a color or strange odor. If you have any of these signs, call your health care provider right away, even if it is before your due date. Follow these instructions at home: Medicines  Follow your health care provider's instructions regarding medicine use. Specific medicines may be either safe or unsafe to take during pregnancy.  Take a prenatal vitamin that contains at least 600 micrograms (mcg) of folic acid.  If you develop constipation, try taking a stool softener if your health care provider approves. Eating and drinking   Eat a balanced diet that includes fresh fruits and vegetables, whole grains, good sources of protein such as meat, eggs, or tofu,  and low-fat dairy. Your health care provider will help you determine the amount of weight gain that is right for you.  Avoid raw meat and uncooked cheese. These carry germs that can cause birth defects in the baby.  If you have low calcium intake from food, talk to your health care provider about whether you should take a daily calcium supplement.  Eat four or five small meals rather than three large meals a day.  Limit foods that are high in fat and processed sugars, such as fried and sweet foods.  To prevent constipation: ? Drink enough fluid to keep your urine clear or pale yellow. ? Eat foods that are high in fiber, such as fresh fruits and vegetables, whole grains, and beans. Activity  Exercise only as directed by your health care provider. Most women can continue their usual exercise routine during pregnancy. Try to exercise for 30 minutes at least 5 days a week. Stop exercising if you experience uterine contractions.  Avoid heavy lifting.  Do   not exercise in extreme heat or humidity, or at high altitudes.  Wear low-heel, comfortable shoes.  Practice good posture.  You may continue to have sex unless your health care provider tells you otherwise. Relieving pain and discomfort  Take frequent breaks and rest with your legs elevated if you have leg cramps or low back pain.  Take warm sitz baths to soothe any pain or discomfort caused by hemorrhoids. Use hemorrhoid cream if your health care provider approves.  Wear a good support bra to prevent discomfort from breast tenderness.  If you develop varicose veins: ? Wear support pantyhose or compression stockings as told by your healthcare provider. ? Elevate your feet for 15 minutes, 3-4 times a day. Prenatal care  Write down your questions. Take them to your prenatal visits.  Keep all your prenatal visits as told by your health care provider. This is important. Safety  Wear your seat belt at all times when driving.  Make  a list of emergency phone numbers, including numbers for family, friends, the hospital, and police and fire departments. General instructions  Avoid cat litter boxes and soil used by cats. These carry germs that can cause birth defects in the baby. If you have a cat, ask someone to clean the litter box for you.  Do not travel far distances unless it is absolutely necessary and only with the approval of your health care provider.  Do not use hot tubs, steam rooms, or saunas.  Do not drink alcohol.  Do not use any products that contain nicotine or tobacco, such as cigarettes and e-cigarettes. If you need help quitting, ask your health care provider.  Do not use any medicinal herbs or unprescribed drugs. These chemicals affect the formation and growth of the baby.  Do not douche or use tampons or scented sanitary pads.  Do not cross your legs for long periods of time.  To prepare for the arrival of your baby: ? Take prenatal classes to understand, practice, and ask questions about labor and delivery. ? Make a trial run to the hospital. ? Visit the hospital and tour the maternity area. ? Arrange for maternity or paternity leave through employers. ? Arrange for family and friends to take care of pets while you are in the hospital. ? Purchase a rear-facing car seat and make sure you know how to install it in your car. ? Pack your hospital bag. ? Prepare the baby's nursery. Make sure to remove all pillows and stuffed animals from the baby's crib to prevent suffocation.  Visit your dentist if you have not gone during your pregnancy. Use a soft toothbrush to brush your teeth and be gentle when you floss. Contact a health care provider if:  You are unsure if you are in labor or if your water has broken.  You become dizzy.  You have mild pelvic cramps, pelvic pressure, or nagging pain in your abdominal area.  You have lower back pain.  You have persistent nausea, vomiting, or  diarrhea.  You have an unusual or bad smelling vaginal discharge.  You have pain when you urinate. Get help right away if:  Your water breaks before 37 weeks.  You have regular contractions less than 5 minutes apart before 37 weeks.  You have a fever.  You are leaking fluid from your vagina.  You have spotting or bleeding from your vagina.  You have severe abdominal pain or cramping.  You have rapid weight loss or weight gain.  You have   shortness of breath with chest pain.  You notice sudden or extreme swelling of your face, hands, ankles, feet, or legs.  Your baby makes fewer than 10 movements in 2 hours.  You have severe headaches that do not go away when you take medicine.  You have vision changes. Summary  The third trimester is from week 28 through week 40, months 7 through 9. The third trimester is a time when the unborn baby (fetus) is growing rapidly.  During the third trimester, your discomfort may increase as you and your baby continue to gain weight. You may have abdominal, leg, and back pain, sleeping problems, and an increased need to urinate.  During the third trimester your breasts will keep growing and they will continue to become tender. A yellow fluid (colostrum) may leak from your breasts. This is the first milk you are producing for your baby.  False labor is a condition in which you feel small, irregular tightenings of the muscles in the womb (contractions) that eventually go away. These are called Braxton Hicks contractions. Contractions may last for hours, days, or even weeks before true labor sets in.  Signs of labor can include: abdominal cramps; regular contractions that start at 10 minutes apart and become stronger and more frequent with time; watery or bloody mucus discharge that comes from the vagina; increased pelvic pressure and dull back pain; and leaking of amniotic fluid. This information is not intended to replace advice given to you by your  health care provider. Make sure you discuss any questions you have with your health care provider. Document Released: 08/27/2001 Document Revised: 10/08/2016 Document Reviewed: 10/08/2016 Elsevier Interactive Patient Education  2019 [employer]  

## 2018-09-10 NOTE — Progress Notes (Signed)
Subjective:  Shawna [name] is a 32 y.o. [id number] at 5047w2d being seen today for ongoing prenatal care.  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  Patient reports general discomforts.  Contractions: Not present. Vag. Bleeding: None.  Movement: Present. Denies leaking of fluid.   The following portions of the patient's history were reviewed and updated as appropriate: allergies, current medications, past family history, past medical history, past social history, past surgical history and problem list. Problem list updated.  Objective:   Vitals:   09/10/18 1317  BP: 126/79  Pulse: 85  Weight: 217 lb (98.4 kg)    Fetal Status: Fetal Heart Rate (bpm): 154   Movement: Present     General:  Alert, oriented and cooperative. Patient is in no acute distress.  Skin: Skin is warm and dry. No rash noted.   Cardiovascular: Normal heart rate noted  Respiratory: Normal respiratory effort, no problems with respiration noted  Abdomen: Soft, gravid, appropriate for gestational age. Pain/Pressure: Present     Pelvic:  Cervical exam deferred        Extremities: Normal range of motion.  Edema: None  Mental Status: Normal mood and affect. Normal behavior. Normal judgment and thought content.   Urinalysis:      Assessment and Plan:  Pregnancy: [id number] at 1647w2d  1. Supervision of high risk pregnancy, antepartum, second trimester Stable   2. Insulin controlled gestational diabetes mellitus (GDM) in third trimester Did not bring CBG's. But reports readings in goal range Has U/S today Will start antenatal testing   Preterm labor symptoms and general obstetric precautions including but not limited to vaginal bleeding,  contractions, leaking of fluid and fetal movement were reviewed in detail with the patient. Please refer to After Visit Summary for other counseling recommendations.  Return in about 1 week (around 09/17/2018) for OB visit.   Hermina StaggersErvin, Syliva Mee L, MD

## 2018-09-11 ENCOUNTER — Other Ambulatory Visit (HOSPITAL_COMMUNITY): Payer: Self-pay | Admitting: *Deleted

## 2018-09-11 DIAGNOSIS — O24414 Gestational diabetes mellitus in pregnancy, insulin controlled: Secondary | ICD-10-CM

## 2018-09-16 NOTE — L&D Delivery Note (Addendum)
Delivery Note At 9:11 AM a viable female was delivered via Vaginal, Spontaneous (Presentation: vertex; OA). APGAR: 9, 9; weight: pending.   Placenta status: delivered spontaneously, intact. Cord: 3-vessel with the following complications: none.  Cord pH: N/A  Anesthesia:  Epidural Episiotomy: None Lacerations: None Suture Repair: None Est. Blood Loss (mL): 235  Mom to postpartum.  Baby to Couplet care / Skin to Skin.  Dollene Cleveland 10/23/2018, 9:43 AM  The patient was noted to be complete and pushing. Patient noted to have epidural anesthesia.  The patient was asked to push and the head delivered spontaneously in the OA position, over an intact perineum. A nuchal cord was checked and none was noted.  The anterior (left) shoulder delivered easily and the posterior (right) shoulder followed. The remainder of the infant was easily delivered and placed on mom's chest skin-to-skin where nursing staff were in attendance. The infant was noted to have spontaneous cry and movement of all 4 extremities. The oropharynx and nasopharynx were bulb suctioned. After a 1-minute delay the cord was clamped x 2 and cut by father of the baby.   Cord blood was then obtained.   The placenta delivered intact spontaneously. Pitocin was started IV to firm the uterus.   Examination of the cervix and vaginal vault did not reveal any lacerations. Examination of the perineum showed no lacerations.  A vaginal pack was then placed.  All sponge and needle counts were correct. Dr. Gwenevere Abbot was present for the entire procedure.   OB FELLOW DELIVERY ATTESTATION  I was gloved and present for the delivery in its entirety, and I agree with the above resident's note.    Gwenevere Abbot, MD  OB Fellow  10/23/2018, 9:53 AM

## 2018-09-17 ENCOUNTER — Encounter (HOSPITAL_COMMUNITY): Payer: Self-pay

## 2018-09-17 ENCOUNTER — Ambulatory Visit (HOSPITAL_COMMUNITY)
Admission: RE | Admit: 2018-09-17 | Discharge: 2018-09-17 | Disposition: A | Discharge status: Home / Self Care | Payer: Self-pay | Source: Ambulatory Visit | Attending: Obstetrics & Gynecology | Admitting: Obstetrics & Gynecology

## 2018-09-17 ENCOUNTER — Other Ambulatory Visit: Payer: Self-pay | Admitting: General Practice

## 2018-09-17 DIAGNOSIS — O24414 Gestational diabetes mellitus in pregnancy, insulin controlled: Secondary | ICD-10-CM

## 2018-09-17 DIAGNOSIS — O99212 Obesity complicating pregnancy, second trimester: Secondary | ICD-10-CM

## 2018-09-17 DIAGNOSIS — Z3A32 32 weeks gestation of pregnancy: Secondary | ICD-10-CM

## 2018-09-17 MED ORDER — ACCU-CHEK GUIDE W/DEVICE KIT: 1.0000 | PACK | Freq: Four times a day (QID) | 0 refills | Status: DC

## 2018-09-17 NOTE — ED Notes (Signed)
Pt states that she has lost her glucose meter.  Called to the clinic, script for new meter will be sent to patients pharmacy.  Pt aware, coupon given.  Eda Royal, spanish interpreter assisted with visit.

## 2018-09-21 ENCOUNTER — Ambulatory Visit (INDEPENDENT_AMBULATORY_CARE_PROVIDER_SITE_OTHER): Payer: Self-pay | Admitting: Obstetrics & Gynecology

## 2018-09-21 ENCOUNTER — Ambulatory Visit (INDEPENDENT_AMBULATORY_CARE_PROVIDER_SITE_OTHER): Payer: Self-pay | Admitting: *Deleted

## 2018-09-21 VITALS — BP 109/65 | HR 75 | Wt 216.9 lb

## 2018-09-21 DIAGNOSIS — O99210 Obesity complicating pregnancy, unspecified trimester: Secondary | ICD-10-CM

## 2018-09-21 DIAGNOSIS — Z789 Other specified health status: Secondary | ICD-10-CM

## 2018-09-21 DIAGNOSIS — O0993 Supervision of high risk pregnancy, unspecified, third trimester: Secondary | ICD-10-CM

## 2018-09-21 DIAGNOSIS — O0992 Supervision of high risk pregnancy, unspecified, second trimester: Secondary | ICD-10-CM

## 2018-09-21 DIAGNOSIS — O99213 Obesity complicating pregnancy, third trimester: Secondary | ICD-10-CM

## 2018-09-21 DIAGNOSIS — O24414 Gestational diabetes mellitus in pregnancy, insulin controlled: Secondary | ICD-10-CM

## 2018-09-21 DIAGNOSIS — Z6833 Body mass index (BMI) 33.0-33.9, adult: Secondary | ICD-10-CM | POA: Insufficient documentation

## 2018-09-21 LAB — POCT URINALYSIS DIP (DEVICE)
Bilirubin Urine: NEGATIVE
Glucose, UA: NEGATIVE mg/dL
Hgb urine dipstick: NEGATIVE
Ketones, ur: NEGATIVE mg/dL
Leukocytes, UA: NEGATIVE
Nitrite: NEGATIVE
Protein, ur: 30 mg/dL — AB
Specific Gravity, Urine: 1.02 (ref 1.005–1.030)
Urobilinogen, UA: 1 mg/dL (ref 0.0–1.0)
pH: 7 (ref 5.0–8.0)

## 2018-09-21 NOTE — Progress Notes (Signed)
Interpreter Nile Riggs present for encounter. Pt states she was not able to check CBG for several weeks due to she had left her meter @ a friend's house. She resumed checking 3 days ago. Pt has sometimes taken am dose of insulin and has not had breakfast. Importance of proper insulin and meal regimen reviewed w/pt. She voiced understanding. Pt had BPP- 8/8 @ [facility] on 1/2. Per Dr. Marice Potter, pt only needs NST today. Weekly BPP scheduled @ [facility].

## 2018-09-21 NOTE — Progress Notes (Signed)
   PRENATAL VISIT NOTE  Subjective:  Shawna [name] is a 33 y.o. [id number] at 3319w6d being seen today for ongoing prenatal care.  She is currently monitored for the following issues for this high-risk pregnancy and has Cholelithiases; Hypertriglyceridemia; Calculus of gallbladder and bile duct without cholecystitis or obstruction; Supervision of high risk pregnancy, antepartum, second trimester; Gestational diabetes; and Obesity in pregnancy on their problem list.  Patient reports no complaints.  Contractions: Irregular. Vag. Bleeding: None.  Movement: Present. Denies leaking of fluid.   The following portions of the patient's history were reviewed and updated as appropriate: allergies, current medications, past family history, past medical history, past social history, past surgical history and problem list. Problem list updated.  Objective:   Vitals:   09/21/18 1437  BP: 109/65  Pulse: 75  Weight: 216 lb 14.4 oz (98.4 kg)    Fetal Status: Fetal Heart Rate (bpm): NST   Movement: Present     General:  Alert, oriented and cooperative. Patient is in no acute distress.  Skin: Skin is warm and dry. No rash noted.   Cardiovascular: Normal heart rate noted  Respiratory: Normal respiratory effort, no problems with respiration noted  Abdomen: Soft, gravid, appropriate for gestational age.  Pain/Pressure: Present     Pelvic: Cervical exam deferred        Extremities: Normal range of motion.  Edema: None  Mental Status: Normal mood and affect. Normal behavior. Normal judgment and thought content.   Assessment and Plan:  Pregnancy: [id number] at 3919w6d  1. Supervision of high risk pregnancy, antepartum, second trimester   2. Insulin controlled gestational diabetes mellitus (GDM) in third trimester -She has been taking her evening insulin with supper instead of taking it prior to going to bed. Her fastings are in the 100s. I have told her to take the evening insulin at bedtime.  - continue  weekly BPP/NST  3. Obesity in pregnancy   Preterm labor symptoms and general obstetric precautions including but not limited to vaginal bleeding, contractions, leaking of fluid and fetal movement were reviewed in detail with the patient. Please refer to After Visit Summary for other counseling recommendations.  Return in about 1 week (around 09/28/2018) for weekly NST .  Future Appointments  Date Time Provider Department Center  09/24/2018 12:30 PM [facility] US 1 [facility]  10/01/2018  3:30 PM [facility] US 1 [facility]  10/08/2018  3:30 PM [facility] US 1 [facility]    Allie BossierMyra C Valerio Pinard, MD

## 2018-09-24 ENCOUNTER — Encounter (HOSPITAL_COMMUNITY): Payer: Self-pay

## 2018-09-24 ENCOUNTER — Ambulatory Visit (HOSPITAL_COMMUNITY)
Admission: RE | Admit: 2018-09-24 | Discharge: 2018-09-24 | Disposition: A | Discharge status: Home / Self Care | Payer: Self-pay | Source: Ambulatory Visit | Attending: Obstetrics & Gynecology | Admitting: Obstetrics & Gynecology

## 2018-09-24 DIAGNOSIS — O99212 Obesity complicating pregnancy, second trimester: Secondary | ICD-10-CM

## 2018-09-24 DIAGNOSIS — O99210 Obesity complicating pregnancy, unspecified trimester: Secondary | ICD-10-CM | POA: Insufficient documentation

## 2018-09-24 DIAGNOSIS — Z3A33 33 weeks gestation of pregnancy: Secondary | ICD-10-CM

## 2018-09-24 DIAGNOSIS — O0992 Supervision of high risk pregnancy, unspecified, second trimester: Secondary | ICD-10-CM | POA: Insufficient documentation

## 2018-09-24 DIAGNOSIS — Z789 Other specified health status: Secondary | ICD-10-CM | POA: Insufficient documentation

## 2018-09-24 DIAGNOSIS — O24414 Gestational diabetes mellitus in pregnancy, insulin controlled: Secondary | ICD-10-CM | POA: Insufficient documentation

## 2018-09-28 ENCOUNTER — Ambulatory Visit (INDEPENDENT_AMBULATORY_CARE_PROVIDER_SITE_OTHER): Payer: Self-pay | Admitting: Family Medicine

## 2018-09-28 ENCOUNTER — Ambulatory Visit (INDEPENDENT_AMBULATORY_CARE_PROVIDER_SITE_OTHER): Payer: Self-pay | Admitting: *Deleted

## 2018-09-28 VITALS — BP 114/70 | HR 91 | Wt 216.1 lb

## 2018-09-28 DIAGNOSIS — Z603 Acculturation difficulty: Secondary | ICD-10-CM

## 2018-09-28 DIAGNOSIS — O99210 Obesity complicating pregnancy, unspecified trimester: Secondary | ICD-10-CM

## 2018-09-28 DIAGNOSIS — O99213 Obesity complicating pregnancy, third trimester: Secondary | ICD-10-CM

## 2018-09-28 DIAGNOSIS — O24414 Gestational diabetes mellitus in pregnancy, insulin controlled: Secondary | ICD-10-CM

## 2018-09-28 DIAGNOSIS — O0993 Supervision of high risk pregnancy, unspecified, third trimester: Secondary | ICD-10-CM

## 2018-09-28 DIAGNOSIS — Z789 Other specified health status: Secondary | ICD-10-CM

## 2018-09-28 DIAGNOSIS — Z3A33 33 weeks gestation of pregnancy: Secondary | ICD-10-CM

## 2018-09-28 DIAGNOSIS — O0990 Supervision of high risk pregnancy, unspecified, unspecified trimester: Secondary | ICD-10-CM

## 2018-09-28 NOTE — Progress Notes (Signed)
Pt reports having indigestion.  Pt advised she may take Pepcid 20 mg daily (OTC).

## 2018-09-28 NOTE — Progress Notes (Signed)
Interpreter Shawna [name] present for encounter. Pt states she is taking second dose of insulin @ bedtime as instructed by Dr. Marice Potter last week. She has noticed an improvement inher fasting blood sugar being lower. Pt is scheduled for weekly BPP @ [facility]. Next growth scan scheduled on 1/23.

## 2018-09-28 NOTE — Progress Notes (Signed)
Subjective:  Shawna [name] is a 33 y.o. [id number] at [redacted]w[redacted]d being seen today for ongoing prenatal care.  She is currently monitored for the following issues for this high-risk pregnancy and has Cholelithiases; Hypertriglyceridemia; Calculus of gallbladder and bile duct without cholecystitis or obstruction; Supervision of high risk pregnancy, antepartum, second trimester; Gestational diabetes; Obesity in pregnancy; and Language barrier affecting health care on their problem list.  GDM: Patient taking insulin.  Reports no hypoglycemic episodes.  Tolerating medication well. Patient did not bring log today Fasting: 80s 2hr PP: 90s-100s, although higher if eats something she shouldn't  Patient reports no complaints.   .  .   . Denies leaking of fluid.   The following portions of the patient's history were reviewed and updated as appropriate: allergies, current medications, past family history, past medical history, past social history, past surgical history and problem list. Problem list updated.  Objective:  There were no vitals filed for this visit.  Fetal Status:           General:  Alert, oriented and cooperative. Patient is in no acute distress.  Skin: Skin is warm and dry. No rash noted.   Cardiovascular: Normal heart rate noted  Respiratory: Normal respiratory effort, no problems with respiration noted  Abdomen: Soft, gravid, appropriate for gestational age.       Pelvic:       Cervical exam deferred        Extremities: Normal range of motion.     Mental Status: Normal mood and affect. Normal behavior. Normal judgment and thought content.   Urinalysis:      Assessment and Plan:  Pregnancy: [id number] at [redacted]w[redacted]d  1. Supervision of high risk pregnancy, antepartum FHT and FH normal  2. Insulin controlled gestational diabetes mellitus (GDM) in third trimester Continue insulin NST reactive BPP on thursday   3. Language barrier affecting health care Interpreter used  4. Obesity in  pregnancy  Preterm labor symptoms and general obstetric precautions including but not limited to vaginal bleeding, contractions, leaking of fluid and fetal movement were reviewed in detail with the patient. Please refer to After Visit Summary for other counseling recommendations.  Return in about 10 days (around 10/08/2018) for NST and HOB (Has [place] @ 3:30) *Needs urine each visit*.   Levie Heritage, DO

## 2018-10-01 ENCOUNTER — Encounter (HOSPITAL_COMMUNITY): Payer: Self-pay

## 2018-10-01 ENCOUNTER — Ambulatory Visit (HOSPITAL_COMMUNITY)
Admission: RE | Admit: 2018-10-01 | Discharge: 2018-10-01 | Disposition: A | Discharge status: Home / Self Care | Payer: Self-pay | Source: Ambulatory Visit | Attending: Obstetrics & Gynecology | Admitting: Obstetrics & Gynecology

## 2018-10-01 DIAGNOSIS — Z3A34 34 weeks gestation of pregnancy: Secondary | ICD-10-CM

## 2018-10-01 DIAGNOSIS — O99212 Obesity complicating pregnancy, second trimester: Secondary | ICD-10-CM

## 2018-10-01 DIAGNOSIS — Z789 Other specified health status: Secondary | ICD-10-CM | POA: Insufficient documentation

## 2018-10-01 DIAGNOSIS — O0992 Supervision of high risk pregnancy, unspecified, second trimester: Secondary | ICD-10-CM | POA: Insufficient documentation

## 2018-10-01 DIAGNOSIS — O24414 Gestational diabetes mellitus in pregnancy, insulin controlled: Secondary | ICD-10-CM | POA: Insufficient documentation

## 2018-10-01 DIAGNOSIS — O99210 Obesity complicating pregnancy, unspecified trimester: Secondary | ICD-10-CM | POA: Insufficient documentation

## 2018-10-08 ENCOUNTER — Inpatient Hospital Stay (HOSPITAL_COMMUNITY)
Admission: AD | Admit: 2018-10-08 | Discharge: 2018-10-11 | DRG: 833 | Disposition: A | Discharge status: Home / Self Care | Payer: Medicaid Other | Attending: Obstetrics and Gynecology | Admitting: Obstetrics and Gynecology

## 2018-10-08 ENCOUNTER — Encounter (HOSPITAL_COMMUNITY): Payer: Self-pay

## 2018-10-08 ENCOUNTER — Ambulatory Visit (INDEPENDENT_AMBULATORY_CARE_PROVIDER_SITE_OTHER): Payer: Self-pay | Admitting: Obstetrics and Gynecology

## 2018-10-08 ENCOUNTER — Ambulatory Visit (HOSPITAL_COMMUNITY)
Admission: RE | Admit: 2018-10-08 | Discharge: 2018-10-08 | Disposition: A | Discharge status: Home / Self Care | Payer: Self-pay | Source: Ambulatory Visit | Attending: Obstetrics & Gynecology | Admitting: Obstetrics & Gynecology

## 2018-10-08 ENCOUNTER — Encounter: Payer: Self-pay | Admitting: Obstetrics and Gynecology

## 2018-10-08 ENCOUNTER — Ambulatory Visit (INDEPENDENT_AMBULATORY_CARE_PROVIDER_SITE_OTHER): Payer: Self-pay | Admitting: *Deleted

## 2018-10-08 VITALS — BP 109/60 | HR 77 | Wt 217.6 lb

## 2018-10-08 DIAGNOSIS — O24414 Gestational diabetes mellitus in pregnancy, insulin controlled: Secondary | ICD-10-CM

## 2018-10-08 DIAGNOSIS — O4103X0 Oligohydramnios, third trimester, not applicable or unspecified: Principal | ICD-10-CM | POA: Diagnosis present

## 2018-10-08 DIAGNOSIS — O99213 Obesity complicating pregnancy, third trimester: Secondary | ICD-10-CM | POA: Diagnosis present

## 2018-10-08 DIAGNOSIS — O99212 Obesity complicating pregnancy, second trimester: Secondary | ICD-10-CM

## 2018-10-08 DIAGNOSIS — Z789 Other specified health status: Secondary | ICD-10-CM

## 2018-10-08 DIAGNOSIS — O0992 Supervision of high risk pregnancy, unspecified, second trimester: Secondary | ICD-10-CM

## 2018-10-08 DIAGNOSIS — O99210 Obesity complicating pregnancy, unspecified trimester: Secondary | ICD-10-CM

## 2018-10-08 DIAGNOSIS — Z3A35 35 weeks gestation of pregnancy: Secondary | ICD-10-CM | POA: Diagnosis not present

## 2018-10-08 DIAGNOSIS — E669 Obesity, unspecified: Secondary | ICD-10-CM | POA: Diagnosis present

## 2018-10-08 DIAGNOSIS — O0993 Supervision of high risk pregnancy, unspecified, third trimester: Secondary | ICD-10-CM

## 2018-10-08 DIAGNOSIS — Z8759 Personal history of other complications of pregnancy, childbirth and the puerperium: Secondary | ICD-10-CM

## 2018-10-08 DIAGNOSIS — O4100X0 Oligohydramnios, unspecified trimester, not applicable or unspecified: Secondary | ICD-10-CM

## 2018-10-08 DIAGNOSIS — O4103X9 Oligohydramnios, third trimester, other fetus: Secondary | ICD-10-CM | POA: Diagnosis present

## 2018-10-08 LAB — COMPREHENSIVE METABOLIC PANEL
ALT: 18 U/L (ref 0–44)
AST: 32 U/L (ref 15–41)
Albumin: 2.9 g/dL — ABNORMAL LOW (ref 3.5–5.0)
Alkaline Phosphatase: 137 U/L — ABNORMAL HIGH (ref 38–126)
Anion gap: 10 (ref 5–15)
BILIRUBIN TOTAL: 1 mg/dL (ref 0.3–1.2)
BUN: 17 mg/dL (ref 6–20)
CO2: 23 mmol/L (ref 22–32)
Calcium: 9 mg/dL (ref 8.9–10.3)
Chloride: 105 mmol/L (ref 98–111)
Creatinine, Ser: 0.5 mg/dL (ref 0.44–1.00)
GFR calc Af Amer: >60 mL/min (ref >60)
Glucose, Bld: 113 mg/dL — ABNORMAL HIGH (ref 70–99)
Potassium: 4.9 mmol/L (ref 3.5–5.1)
Sodium: 138 mmol/L (ref 135–145)
Total Protein: 6.3 g/dL — ABNORMAL LOW (ref 6.5–8.1)

## 2018-10-08 LAB — POCT URINALYSIS DIP (DEVICE)
Bilirubin Urine: NEGATIVE
Glucose, UA: NEGATIVE mg/dL
Hgb urine dipstick: NEGATIVE
Ketones, ur: NEGATIVE mg/dL
Leukocytes, UA: NEGATIVE
Nitrite: NEGATIVE
Protein, ur: NEGATIVE mg/dL
Specific Gravity, Urine: 1.02 (ref 1.005–1.030)
Urobilinogen, UA: 0.2 mg/dL (ref 0.0–1.0)
pH: 7 (ref 5.0–8.0)

## 2018-10-08 LAB — GLUCOSE, CAPILLARY: GLUCOSE-CAPILLARY: 96 mg/dL (ref 70–99)

## 2018-10-08 LAB — CBC
HCT: 36.1 % (ref 36.0–46.0)
Hemoglobin: 12.1 g/dL (ref 12.0–15.0)
MCH: 30.9 pg (ref 26.0–34.0)
MCHC: 33.5 g/dL (ref 30.0–36.0)
MCV: 92.1 fL (ref 80.0–100.0)
Platelets: 315 10*3/uL (ref 150–400)
RBC: 3.92 MIL/uL (ref 3.87–5.11)
RDW: 13 % (ref 11.5–15.5)
WBC: 7.6 10*3/uL (ref 4.0–10.5)
nRBC: 0 % (ref 0.0–0.2)

## 2018-10-08 LAB — TYPE AND SCREEN
ABO/RH(D): O POS
Antibody Screen: NEGATIVE

## 2018-10-08 LAB — OB RESULTS CONSOLE GBS: GBS: POSITIVE

## 2018-10-08 MED ORDER — BETAMETHASONE SOD PHOS & ACET 6 (3-3) MG/ML IJ SUSP
12.0000 mg | INTRAMUSCULAR | Status: AC
  Administered 2018-10-08 – 2018-10-09 (×2): 12 mg via INTRAMUSCULAR
  Filled 2018-10-08 (×2): qty 2

## 2018-10-08 MED ORDER — CALCIUM CARBONATE ANTACID 500 MG PO CHEW: 2.0000 | CHEWABLE_TABLET | ORAL | Status: DC | PRN

## 2018-10-08 MED ORDER — DOCUSATE SODIUM 100 MG PO CAPS
100.0000 mg | ORAL_CAPSULE | Freq: Every day | ORAL | Status: DC
  Administered 2018-10-09 – 2018-10-11 (×3): 100 mg via ORAL
  Filled 2018-10-08 (×3): qty 1

## 2018-10-08 MED ORDER — ENOXAPARIN SODIUM 40 MG/0.4ML ~~LOC~~ SOLN
40.0000 mg | SUBCUTANEOUS | Status: DC
  Administered 2018-10-08 – 2018-10-10 (×3): 40 mg via SUBCUTANEOUS
  Filled 2018-10-08 (×3): qty 0.4

## 2018-10-08 MED ORDER — PRENATAL MULTIVITAMIN CH
1.0000 | ORAL_TABLET | Freq: Every day | ORAL | Status: DC
  Administered 2018-10-09 – 2018-10-11 (×3): 1 via ORAL
  Filled 2018-10-08 (×3): qty 1

## 2018-10-08 MED ORDER — ASPIRIN EC 81 MG PO TBEC
81.0000 mg | DELAYED_RELEASE_TABLET | Freq: Every day | ORAL | Status: DC
  Administered 2018-10-09 – 2018-10-11 (×3): 81 mg via ORAL
  Filled 2018-10-08 (×5): qty 1

## 2018-10-08 MED ORDER — ZOLPIDEM TARTRATE 5 MG PO TABS: 5.0000 mg | ORAL_TABLET | Freq: Every evening | ORAL | Status: DC | PRN

## 2018-10-08 MED ORDER — INSULIN NPH (HUMAN) (ISOPHANE) 100 UNIT/ML ~~LOC~~ SUSP
22.0000 [IU] | Freq: Two times a day (BID) | SUBCUTANEOUS | Status: DC
  Administered 2018-10-09 – 2018-10-10 (×3): 22 [IU] via SUBCUTANEOUS
  Filled 2018-10-08: qty 10

## 2018-10-08 MED ORDER — ACETAMINOPHEN 325 MG PO TABS
650.0000 mg | ORAL_TABLET | ORAL | Status: DC | PRN
  Administered 2018-10-10 (×2): 650 mg via ORAL
  Filled 2018-10-08 (×2): qty 2

## 2018-10-08 MED ORDER — COMFORT FIT MATERNITY SUPP MED MISC: 0 refills | Status: DC

## 2018-10-08 MED ORDER — LACTATED RINGERS IV SOLN
INTRAVENOUS | Status: DC
  Administered 2018-10-08 – 2018-10-11 (×8): via INTRAVENOUS

## 2018-10-08 NOTE — Progress Notes (Signed)
Interpreter Gardiner Ramus present for encounter. Pt reports having pain in her Rt thigh up to her hip. Pt has [place] growth and BPP @ [facility] today

## 2018-10-08 NOTE — H&P (Signed)
FACULTY PRACTICE ANTEPARTUM ADMISSION HISTORY AND PHYSICAL NOTE   History of Present Illness: Shawna [name] is a 33 y.o. [phone] at 36w2dadmitted for oligohydramnios (AFI 4.19 on 10/08/2018).  She is being monitored for GDMA2 treated with insulin (NPH 22 units BID), which has been well controlled. She reports feeling well aside from mild pelvic pain and R leg pain.  Her biggest baby was 7lb10oz, 2 years ago. This baby is measuring 5lb (29th%tile on 1/23).  Patient reports the fetal movement as active. Patient reports uterine contraction  activity as none. Patient reports  vaginal bleeding as none. Patient describes fluid per vagina as None. Fetal presentation is cephalic.  Patient Active Problem List   Diagnosis Date Noted  . Oligohydramnios antepartum, third trimester, other fetus 10/08/2018  . Obesity in pregnancy 09/21/2018  . Language barrier affecting health care 09/21/2018  . Supervision of high risk pregnancy, antepartum, second trimester 05/06/2018  . Gestational diabetes 05/06/2018  . Calculus of gallbladder and bile duct without cholecystitis or obstruction 08/24/2015  . Hypertriglyceridemia 07/12/2014  . Cholelithiases 01/03/2014    Past Medical History:  Diagnosis Date  . Diabetes mellitus without complication ([place]   . Gallstone   . Gestational diabetes   . Pregnant     Past Surgical History:  Procedure Laterality Date  . NO PAST SURGERIES      OB History  Gravida Para Term Preterm AB Living  _0 0 3 3  SAB TAB Ectopic Multiple Live Births  3 0 0 0 3    # Outcome Date GA Lbr Len/2nd Weight Sex Delivery Anes PTL Lv  7 Current           6 Term 06/25/16 484w0d4:19 / 00:27 3485 g M Vag-Spont EPI  LIV  5 SAB           4 SAB           3 SAB           2 Term      Vag-Spont   LIV  1 Term      Vag-Spont   LIV    Social History   Socioeconomic History  . Marital status: Single    Spouse name: Not on file  . Number of children: Not on file  . Years  of education: Not on file  . Highest education level: Not on file  Occupational History  . Not on file  Social Needs  . Financial resource strain: Not on file  . Food insecurity:    Worry: Not on file    Inability: Not on file  . Transportation needs:    Medical: Not on file    Non-medical: Not on file  Tobacco Use  . Smoking status: Never Smoker  . Smokeless tobacco: Never Used  Substance and Sexual Activity  . Alcohol use: No  . Drug use: No  . Sexual activity: Yes    Birth control/protection: None  Lifestyle  . Physical activity:    Days per week: Not on file    Minutes per session: Not on file  . Stress: Not on file  Relationships  . Social connections:    Talks on phone: Not on file    Gets together: Not on file    Attends religious service: Not on file    Active member of club or organization: Not on file    Attends meetings of clubs or organizations: Not on file    Relationship status: Not  on file  Other Topics Concern  . Not on file  Social History Narrative  . Not on file    Family History  Problem Relation Age of Onset  . Diabetes Mother   . Cancer Mother   . Heart disease Father   . Hypertension Father     No Known Allergies  Medications Prior to Admission  Medication Sig Dispense Refill Last Dose  . ACCU-CHEK FASTCLIX LANCETS MISC 1 each by Percutaneous route 4 (four) times daily. 100 each 12 Taking  . aspirin EC 81 MG tablet Take 1 tablet (81 mg total) by mouth daily. Take after 12 weeks for prevention of preeclampsia later in pregnancy 300 tablet 2 Taking  . Blood Glucose Monitoring Suppl (ACCU-CHEK GUIDE) w/Device KIT 1 each by Does not apply route 4 (four) times daily. 1 kit 0 Taking  . Elastic Bandages & Supports (COMFORT FIT MATERNITY SUPP MED) MISC Wear daily when ambulating 1 each 0   . glucose blood (ACCU-CHEK GUIDE) test strip Use as instructed 50 each 12 Taking  . insulin NPH Human (HUMULIN N,NOVOLIN N) 100 UNIT/ML injection Inject 0.22  mLs (22 Units total) into the skin 2 (two) times daily before a meal. 10 mL 3 Taking  . Insulin Syringe-Needle U-100 (INSULIN SYRINGE 1CC/31GX5/16") 31G X 5/16" 1 ML MISC 1 each by Does not apply route 3 (three) times daily. 90 each 1 Taking  . Prenatal Vit-Fe Fumarate-FA (PRENATAL MULTIVITAMIN) TABS tablet Take 1 tablet by mouth daily at 12 noon.   Taking    Review of Systems - Negative except as mentioned in the HPI  Vitals:  BP 114/79 (BP Location: Left Arm)   Pulse 84   Temp 98.8 F (37.1 C) (Oral)   Resp 19   LMP 12/24/2017 (Approximate)   SpO2 100%  Physical Examination: CONSTITUTIONAL: Well-developed, well-nourished female in no acute distress.  HENT:  Normocephalic, atraumatic, External right and left ear normal. Oropharynx is clear and moist EYES: Conjunctivae and EOM are normal. Pupils are equal, round, and reactive to light. No scleral icterus.  NECK: Normal range of motion, supple, no masses SKIN: Skin is warm and dry. No rash noted. Not diaphoretic. No erythema. No pallor. [place]: Alert and oriented to person, place, and time. Normal reflexes, muscle tone coordination. No cranial nerve deficit noted. PSYCHIATRIC: Normal mood and affect. Normal behavior. Normal judgment and thought content. CARDIOVASCULAR: Normal heart rate noted, regular rhythm RESPIRATORY: Effort and breath sounds normal, no problems with respiration noted ABDOMEN: Soft, nontender, nondistended, gravid. MUSCULOSKELETAL: Normal range of motion. No edema and no tenderness. 2+ distal pulses.  Cervix: Not evaluated  Membranes:intact Fetal Monitoring:Baseline: 145 bpm, Variability: Good {> 6 bpm), Accelerations: Reactive and Decelerations: Absent Tocometer: Flat  Labs:  Results for orders placed or performed in visit on 10/08/18 (from the past 24 hour(s))  POCT urinalysis dip (device)   Collection Time: 10/08/18  1:30 PM  Result Value Ref Range   Glucose, UA NEGATIVE NEGATIVE mg/dL   Bilirubin Urine  NEGATIVE NEGATIVE   Ketones, ur NEGATIVE NEGATIVE mg/dL   Specific Gravity, Urine 1.020 1.005 - 1.030   Hgb urine dipstick NEGATIVE NEGATIVE   pH 7.0 5.0 - 8.0   Protein, ur NEGATIVE NEGATIVE mg/dL   Urobilinogen, UA 0.2 0.0 - 1.0 mg/dL   Nitrite NEGATIVE NEGATIVE   Leukocytes, UA NEGATIVE NEGATIVE    Imaging Studies: [place] Mfm Fetal Bpp Wo Non Stress  Result Date: 10/08/2018 ----------------------------------------------------------------------  OBSTETRICS REPORT                       (  Signed Final 10/08/2018 04:32 pm) ---------------------------------------------------------------------- Patient Info  ID #:       [id number]                          D.O.B.:  11-02-1985 (32 yrs)  Name:       Leim Fabry              Visit Date: 10/08/2018 03:57 pm ---------------------------------------------------------------------- Performed By  Performed By:     Valda Favia          Ref. Address:     [facility]                                                             [place]                                                             [place], [place]  Attending:        Tama High MD        Location:         [facility]  Referred By:      [facility] for                    [place] ---------------------------------------------------------------------- Orders   #  Description                          Code         Ordered By   1  [place] MFM FETAL BPP WO NON  62831.51     Lora Paula      STRESS   2  [place] MFM OB FOLLOW UP                  76160.73     Lora Paula  ----------------------------------------------------------------------   #  Order #                    Accession #                  Episode #   1  [id number]                  [id number]                  [id number]   2  [id number]                  [id number]                  [id number]  ---------------------------------------------------------------------- Indications   [redacted] weeks gestation of pregnancy                Z3A.35   Gestational diabetes in pregnancy, insulin     O24.414   controlled   Obesity complicating pregnancy, second         O99.212   trimester (Pre Pregnancy BMI 36)   History of congenital or genetic condition     Z87.798   (Nephew with T21 - declined GC)  ---------------------------------------------------------------------- Vital Signs                                                 Height:        5'5" ---------------------------------------------------------------------- Fetal Evaluation  Num Of Fetuses:         1  Fetal Heart Rate(bpm):  138  Cardiac Activity:       Observed  Presentation:           Cephalic  Placenta:               Posterior  Amniotic Fluid  AFI FV:      Oligohydramnios  AFI Sum(cm)     %Tile       Largest Pocket(cm)  4.19            < 3         1.57  RUQ(cm)       RLQ(cm)       LUQ(cm)        LLQ(cm)  0             1.37          1.57           1.25 ---------------------------------------------------------------------- Biophysical Evaluation  Amniotic F.V:   Pocket => 2 cm two         F. Tone:        Observed                  planes  F. Movement:    Observed                   Score:          8/8  F. Breathing:   Observed ---------------------------------------------------------------------- Biometry  BPD:        86  mm  G. Age:  34w 5d         36  %    CI:        75.99   %    70 - 86                                                          FL/HC:      19.3   %    20.1 - 22.3  HC:      312.7  mm     G. Age:  35w 0d         15  %    HC/AC:      1.02        0.93 - 1.11  AC:      306.2  mm     G. Age:  34w 4d         37  %    FL/BPD:     70.3   %    71 - 87  FL:       60.5  mm     G. Age:  31w 3d        < 3  %     FL/AC:      19.8   %    20 - 24  HUM:      52.8  mm     G. Age:  30w 5d        < 5  %  Est. FW:    2271  gm           5 lb     29  % ---------------------------------------------------------------------- OB History  Gravidity:    7         Term:   3        Prem:   0        SAB:   3  Ectopic:      0        Living:  3 ---------------------------------------------------------------------- Gestational Age  LMP:           41w 1d        Date:  12/24/17                 EDD:   09/30/18  U/S Today:     34w 0d                                        EDD:   11/19/18  Best:          35w 2d     Det. By:  U/S C R L  (04/29/18)    EDD:   11/10/18 ---------------------------------------------------------------------- Anatomy  Cranium:               Appears normal         Aortic Arch:            Previously seen  Cavum:                 Previously seen        Ductal Arch:            Not well visualized  Ventricles:  Appears normal         Diaphragm:              Previously seen  Choroid Plexus:        Previously seen        Stomach:                Appears normal, left                                                                        sided  Cerebellum:            Previously seen        Abdomen:                Appears normal  Posterior Fossa:       Previously seen        Abdominal Wall:         Previously seen  Nuchal Fold:           Previously seen        Cord Vessels:           Previously seen  Face:                  Orbits and profile     Kidneys:                Appear normal                         previously seen  Lips:                  Previously seen        Bladder:                Appears normal  Thoracic:              Appears normal         Spine:                  Limited views                                                                        previously seen  Heart:                 Appears normal         Upper Extremities:      Previously seen                         (4CH, axis, and situs  RVOT:                   Previously seen        Lower Extremities:      Previously seen  LVOT:                  Previously seen  Other:  Fetus appears to be a female. Heels and Nasal bone previously          visualized. Technically difficult due to maternal habitus and fetal          position. ---------------------------------------------------------------------- Cervix Uterus Adnexa  Cervix  Not visualized (advanced GA >24wks)  Left Ovary  No adnexal mass visualized.  Right Ovary  No adnexal mass visualized. ---------------------------------------------------------------------- Impression  Fetal growth is appropriate for gestational age.  Oligohydramnios is seen. Antenatal testing is reassuring.  BPP 8/8.  I explained the findings with help of language interpreter.  Patient reports increased mucoid vaginal discharge.  Discussed with Dr. Rip Harbour. I recommend inpatient  management and reevaluation of AFI in 48 to 72 hours. ---------------------------------------------------------------------- Recommendations  Continue weekly antenatal testing till delivery. ----------------------------------------------------------------------                  Tama High, MD Electronically Signed Final Report   10/08/2018 04:32 pm ----------------------------------------------------------------------  [place] Mfm Fetal Bpp Wo Non Stress  Result Date: 10/01/2018 ----------------------------------------------------------------------  OBSTETRICS REPORT                       (Signed Final 10/01/2018 04:32 pm) ---------------------------------------------------------------------- Patient Info  ID #:       [id number]                          D.O.B.:  04-27-1986 (32 yrs)  Name:       Leim Fabry              Visit Date: 10/01/2018 03:29 pm ---------------------------------------------------------------------- Performed By  Performed By:     Corky Crafts             Ref. Address:     [facility]                                                             [place], [place]  [place]  Attending:        Tama High MD        Location:         [facility]  Referred By:      [facility] for                    [place] ---------------------------------------------------------------------- Orders   #  Description                          Code         Ordered By   1  [place] MFM FETAL BPP WO NON              74128.78     Lora Paula      STRESS  ----------------------------------------------------------------------   #  Order #                    Accession #                 Episode #   1  [id number]                  [id number]                  [id number]  ---------------------------------------------------------------------- Indications   Gestational diabetes in pregnancy, insulin     O24.414   controlled   Obesity complicating pregnancy, second         O99.212   trimester (Pre Pregnancy BMI 36)   History of congenital or genetic condition     Z87.798   (Nephew with T21 - declined GC)   [redacted] weeks gestation of pregnancy                Z3A.34  ---------------------------------------------------------------------- Vital Signs  Weight (lb): 216                               Height:        5'5"  BMI:         35.94 ---------------------------------------------------------------------- Fetal Evaluation  Num Of Fetuses:         1  Fetal Heart Rate(bpm):  161  Cardiac Activity:       Observed  Presentation:           Cephalic  Placenta:               Posterior  Amniotic Fluid  AFI FV:      Within normal limits  AFI Sum(cm)     %Tile       Largest Pocket(cm)  7.96            5           3.06  RUQ(cm)       RLQ(cm)       LUQ(cm)         LLQ(cm)  0             3.06          2.7            2.2 ---------------------------------------------------------------------- Biophysical Evaluation  Amniotic F.V:   Within normal  limits       F. Tone:        Observed  F. Movement:    Observed                   Score:          8/8  F. Breathing:   Observed ---------------------------------------------------------------------- OB History  Gravidity:    7         Term:   3        Prem:   0        SAB:   3  Ectopic:      0        Living:  3 ---------------------------------------------------------------------- Gestational Age  LMP:           40w 1d        Date:  12/24/17                 EDD:   09/30/18  Best:          34w 2d     Det. By:  U/S C R L  (04/29/18)    EDD:   11/10/18 ---------------------------------------------------------------------- Cervix Uterus Adnexa  Cervix  Normal appearance by transabdominal scan. ---------------------------------------------------------------------- Impression  Amniotic fluid is normal and good fetal activity is seen.  Antenatal testing is reassuring. BPP 8/8. ---------------------------------------------------------------------- Recommendations  -Continue weekly antenatal testing till delivery.  -Fetal growth assessment next week. ----------------------------------------------------------------------                  Tama High, MD Electronically Signed Final Report   10/01/2018 04:32 pm ----------------------------------------------------------------------  [place] Mfm Fetal Bpp Wo Non Stress  Result Date: 09/24/2018 ----------------------------------------------------------------------  OBSTETRICS REPORT                       (Signed Final 09/24/2018 01:50 pm) ---------------------------------------------------------------------- Patient Info  ID #:       [id number]                          D.O.B.:  1986/01/28 (32 yrs)  Name:       Leim Fabry              Visit Date: 09/24/2018 12:52 pm  ---------------------------------------------------------------------- Performed By  Performed By:     Valda Favia          Ref. Address:      [facility]                                                              [address]  [place], [place]  Attending:        Sander Nephew      Location:          [facility]                    MD  Referred By:      [facility] for                    [place] ---------------------------------------------------------------------- Orders   #  Description                          Code         Ordered By   1  [place] MFM FETAL BPP WO NON              76808.81     Lora Paula      STRESS  ----------------------------------------------------------------------   #  Order #                    Accession #                 Episode #   1  [id number]                  [id number]                  [id number]  ---------------------------------------------------------------------- Indications   Gestational diabetes in pregnancy, insulin     O24.414   controlled   Obesity complicating pregnancy, second         O99.212   trimester (Pre Pregnancy BMI 36)   History of congenital or genetic condition     Z87.798   (Nephew with T21 - declined GC)   [redacted] weeks gestation of pregnancy                Z3A.33  ---------------------------------------------------------------------- Vital Signs                                                 Height:        5'5" ---------------------------------------------------------------------- Fetal Evaluation  Num Of Fetuses:          1  Fetal Heart Rate(bpm):   129  Cardiac Activity:        Observed  Presentation:  Cephalic  Amniotic Fluid  AFI FV:      Within normal limits  AFI Sum(cm)     %Tile       Largest Pocket(cm)  11.24           27          5.23  RUQ(cm)       RLQ(cm)       LUQ(cm)        LLQ(cm)  2.85          0             5.23           3.16 ---------------------------------------------------------------------- Biophysical Evaluation  Amniotic F.V:   Within normal limits       F. Tone:         Observed  F. Movement:    Observed                   Score:           8/8  F. Breathing:   Observed ---------------------------------------------------------------------- OB History  Gravidity:    7         Term:   3        Prem:   0        SAB:   3  Ectopic:      0        Living:  3 ---------------------------------------------------------------------- Gestational Age  LMP:           59w 1d        Date:  12/24/17                 EDD:   09/30/18  Best:          33w 2d     Det. By:  U/S C R L  (04/29/18)    EDD:   11/10/18 ---------------------------------------------------------------------- Anatomy  Stomach:               Appears normal, left   Bladder:                Appears normal                         sided ---------------------------------------------------------------------- Cervix Uterus Adnexa  Cervix  Not visualized (advanced GA >24wks) ---------------------------------------------------------------------- Impression  Biophysical profile 8/8 ---------------------------------------------------------------------- Recommendations  Continue weekly testing as previously scheduled. ----------------------------------------------------------------------               Sander Nephew, MD Electronically Signed Final Report   09/24/2018 01:50 pm ----------------------------------------------------------------------  [place] Mfm Fetal Bpp Wo Non Stress  Result Date: 09/17/2018 ----------------------------------------------------------------------  OBSTETRICS REPORT                        (Signed Final 09/17/2018  09:27 am) ---------------------------------------------------------------------- Patient Info  ID #:       [id number]                          D.O.B.:  1986-02-04 (32 yrs)  Name:       Leim Fabry              Visit Date: 09/17/2018 07:36 am ---------------------------------------------------------------------- Performed By  Performed By:     Rodrigo Ran BS      Ref. Address:      [facility]  [facility]                                                              [facility]                                                              [place], [place]  Attending:        Sander Nephew      Location:          [facility]                    MD  Referred By:      [facility] for                    [place] ---------------------------------------------------------------------- Orders   #  Description                          Code         Ordered By   1  [place] MFM FETAL BPP WO NON              28786.76     Vincent  ----------------------------------------------------------------------   #  Order #                    Accession #                 Episode #   1  [id number]                  [id number]  [id number]  ---------------------------------------------------------------------- Indications   Gestational diabetes in pregnancy, insulin     O24.414   controlled   Obesity complicating pregnancy, second         O99.212   trimester (Pre Pregnancy BMI 36)   History of congenital or genetic condition     Z87.798   (Nephew with T21 - declined GC)   [redacted] weeks gestation of pregnancy                Z3A.32  ---------------------------------------------------------------------- Vital Signs                                                  Height:        5'5" ---------------------------------------------------------------------- Fetal Evaluation  Num Of Fetuses:          1  Fetal Heart Rate(bpm):   141  Cardiac Activity:        Observed  Presentation:            Cephalic  Amniotic Fluid  AFI FV:      Within normal limits  AFI Sum(cm)     %Tile       Largest Pocket(cm)  8.66            6           3.56  RUQ(cm)                     LUQ(cm)        LLQ(cm)  3.56                        3.24           1.86 ---------------------------------------------------------------------- Biophysical Evaluation  Amniotic F.V:   Within normal limits       F. Tone:         Observed  F. Movement:    Observed                   Score:           8/8  F. Breathing:   Observed ---------------------------------------------------------------------- OB History  Gravidity:    7         Term:   3        Prem:   0        SAB:   3  Ectopic:      0        Living:  3 ---------------------------------------------------------------------- Gestational Age  LMP:           38w 1d        Date:  12/24/17                 EDD:   09/30/18  Best:          Milderd Meager 2d     Det. By:  U/S C R L  (04/29/18)    EDD:   11/10/18 ---------------------------------------------------------------------- Impression  Biophysical profiled 8/8 ---------------------------------------------------------------------- Recommendations  Continue weekly BPP (scheduled today).  Follow up growth in 3 weeks. ----------------------------------------------------------------------               Sander Nephew, MD Electronically Signed Final Report   09/17/2018 09:27 am ----------------------------------------------------------------------  [place] Mfm Ob Follow Up  Result Date: 10/08/2018 ----------------------------------------------------------------------  OBSTETRICS REPORT                       (  Signed Final 10/08/2018 04:32 pm)  ---------------------------------------------------------------------- Patient Info  ID #:       [id number]                          D.O.B.:  September 10, 1986 (32 yrs)  Name:       Leim Fabry              Visit Date: 10/08/2018 03:57 pm ---------------------------------------------------------------------- Performed By  Performed By:     Valda Favia          Ref. Address:     [facility]                                                             [address]                                                             [place], [place]  Attending:        Tama High MD        Location:         [facility]  Referred By:      [facility] for                    [place] ---------------------------------------------------------------------- Orders   #  Description                          Code         Ordered By   1  [place] MFM FETAL BPP WO NON  38453.64     Lora Paula      STRESS   2  [place] MFM OB FOLLOW UP                  68032.12     Lora Paula  ----------------------------------------------------------------------   #  Order #                    Accession #                 Episode #   1  [id number]                  [id number]                  [id number]   2  [id number]                  [id number]                  [id number]  ---------------------------------------------------------------------- Indications   [redacted] weeks gestation of pregnancy                Z3A.35   Gestational diabetes in pregnancy, insulin     O24.414   controlled   Obesity complicating pregnancy, second         O99.212   trimester (Pre Pregnancy BMI 36)   History of congenital or genetic condition     Z87.798   (Nephew with T21 - declined GC)   ---------------------------------------------------------------------- Vital Signs                                                 Height:        5'5" ---------------------------------------------------------------------- Fetal Evaluation  Num Of Fetuses:         1  Fetal Heart Rate(bpm):  138  Cardiac Activity:       Observed  Presentation:           Cephalic  Placenta:               Posterior  Amniotic Fluid  AFI FV:      Oligohydramnios  AFI Sum(cm)     %Tile       Largest Pocket(cm)  4.19            < 3         1.57  RUQ(cm)       RLQ(cm)       LUQ(cm)        LLQ(cm)  0             1.37          1.57           1.25 ---------------------------------------------------------------------- Biophysical Evaluation  Amniotic F.V:   Pocket => 2 cm two         F. Tone:        Observed                  planes  F. Movement:    Observed                   Score:          8/8  F. Breathing:   Observed ---------------------------------------------------------------------- Biometry  BPD:        86  mm  G. Age:  34w 5d         36  %    CI:        75.99   %    70 - 86                                                          FL/HC:      19.3   %    20.1 - 22.3  HC:      312.7  mm     G. Age:  35w 0d         15  %    HC/AC:      1.02        0.93 - 1.11  AC:      306.2  mm     G. Age:  34w 4d         37  %    FL/BPD:     70.3   %    71 - 87  FL:       60.5  mm     G. Age:  31w 3d        < 3  %    FL/AC:      19.8   %    20 - 24  HUM:      52.8  mm     G. Age:  30w 5d        < 5  %  Est. FW:    2271  gm           5 lb     29  % ---------------------------------------------------------------------- OB History  Gravidity:    7         Term:   3        Prem:   0        SAB:   3  Ectopic:      0        Living:  3 ---------------------------------------------------------------------- Gestational Age  LMP:           41w 1d        Date:  12/24/17                 EDD:   09/30/18  U/S Today:     34w 0d                                         EDD:   11/19/18  Best:          35w 2d     Det. By:  U/S C R L  (04/29/18)    EDD:   11/10/18 ---------------------------------------------------------------------- Anatomy  Cranium:               Appears normal         Aortic Arch:            Previously seen  Cavum:                 Previously seen        Ductal Arch:            Not well visualized  Ventricles:  Appears normal         Diaphragm:              Previously seen  Choroid Plexus:        Previously seen        Stomach:                Appears normal, left                                                                        sided  Cerebellum:            Previously seen        Abdomen:                Appears normal  Posterior Fossa:       Previously seen        Abdominal Wall:         Previously seen  Nuchal Fold:           Previously seen        Cord Vessels:           Previously seen  Face:                  Orbits and profile     Kidneys:                Appear normal                         previously seen  Lips:                  Previously seen        Bladder:                Appears normal  Thoracic:              Appears normal         Spine:                  Limited views                                                                        previously seen  Heart:                 Appears normal         Upper Extremities:      Previously seen                         (4CH, axis, and situs  RVOT:                  Previously seen        Lower Extremities:      Previously seen  LVOT:                  Previously seen  Other:  Fetus appears to be a female. Heels and Nasal bone previously          visualized. Technically difficult due to maternal habitus and fetal          position. ---------------------------------------------------------------------- Cervix Uterus Adnexa  Cervix  Not visualized (advanced GA >24wks)  Left Ovary  No adnexal mass visualized.  Right Ovary  No adnexal mass visualized.  ---------------------------------------------------------------------- Impression  Fetal growth is appropriate for gestational age.  Oligohydramnios is seen. Antenatal testing is reassuring.  BPP 8/8.  I explained the findings with help of language interpreter.  Patient reports increased mucoid vaginal discharge.  Discussed with Dr. Rip Harbour. I recommend inpatient  management and reevaluation of AFI in 48 to 72 hours. ---------------------------------------------------------------------- Recommendations  Continue weekly antenatal testing till delivery. ----------------------------------------------------------------------                  Tama High, MD Electronically Signed Final Report   10/08/2018 04:32 pm ----------------------------------------------------------------------  [place] Mfm Ob Follow Up  Result Date: 09/10/2018 ----------------------------------------------------------------------  OBSTETRICS REPORT                       (Signed Final 09/10/2018 04:20 pm) ---------------------------------------------------------------------- Patient Info  ID #:       [id number]                          D.O.B.:  [birth year] (32 yrs)  Name:       Leim Fabry              Visit Date: 09/10/2018 03:46 pm ---------------------------------------------------------------------- Performed By  Performed By:     Jeanene Erb BS,      Ref. Address:     [facility]                                                             [place]  Attending:        Lajoyce Lauber      Location:         [facility]                    MD  Referred By:      Judieth Keens NP ---------------------------------------------------------------------- Orders   #  Description  Code         Ordered By   1  [place] MFM OB  FOLLOW UP                  [id number]     Sander Nephew  ----------------------------------------------------------------------   #  Order #                    Accession #                 Episode #   1  [id number]                  [id number]                  [id number]  ---------------------------------------------------------------------- Indications   Gestational diabetes in pregnancy, insulin     O24.414   controlled   Obesity complicating pregnancy, second         O99.212   trimester (Pre Pregnancy BMI 36)   History of congenital or genetic condition     Z87.798   (Nephew with T21 - declined GC)   [redacted] weeks gestation of pregnancy                Z3A.31  ---------------------------------------------------------------------- Vital Signs  Weight (lb): 218                               Height:        5'5"  BMI:         36.27 ---------------------------------------------------------------------- Fetal Evaluation  Num Of Fetuses:         1  Fetal Heart Rate(bpm):  143  Cardiac Activity:       Observed  Presentation:           Cephalic  Placenta:               Posterior  P. Cord Insertion:      Previously Visualized  Amniotic Fluid  AFI FV:      Within normal limits  AFI Sum(cm)     %Tile       Largest Pocket(cm)  13.68           44          3.91  RUQ(cm)       RLQ(cm)       LUQ(cm)        LLQ(cm)  3.91          3.07          3.63           3.07 ---------------------------------------------------------------------- Biophysical Evaluation  Amniotic F.V:   Within normal limits       F. Tone:        Observed  F. Movement:    Observed                   Score:          8/8  F. Breathing:   Observed ---------------------------------------------------------------------- Biometry  BPD:      80.2  mm     G. Age:  32w 1d         68  %    CI:         77.4   %    70 - 86                                                          FL/HC:      20.2   %    19.3 - 21.3  HC:      288.6   mm     G. Age:  31w 5d         26  %    HC/AC:      1.03        0.96 - 1.17  AC:      279.7  mm     G. Age:  32w 0d         69  %    FL/BPD:     72.6   %    71 - 87  FL:       58.2  mm     G. Age:  30w 3d         17  %    FL/AC:      20.8   %    20 - 24  Est. FW:    1784  gm    3 lb 15 oz      60  % ---------------------------------------------------------------------- OB History  Gravidity:    7         Term:   3        Prem:   0        SAB:   3  Ectopic:      0        Living:  3 ---------------------------------------------------------------------- Gestational Age  LMP:           37w 1d        Date:  12/24/17                 EDD:   09/30/18  U/S Today:     31w 4d                                        EDD:   11/08/18  Best:          31w 2d     Det. By:  U/S C R L  (04/29/18)    EDD:   11/10/18 ---------------------------------------------------------------------- Anatomy  Cranium:               Appears normal         Aortic Arch:            Previously seen  Cavum:                 Previously seen        Ductal Arch:            Not well visualized  Ventricles:            Previously seen        Diaphragm:              Previously seen  Choroid  Plexus:        Previously seen        Stomach:                Appears normal, left                                                                        sided  Cerebellum:            Previously seen        Abdomen:                Appears normal  Posterior Fossa:       Previously seen        Abdominal Wall:         Previously seen  Nuchal Fold:           Previously seen        Cord Vessels:           Previously seen  Face:                  Orbits and profile     Kidneys:                Appear normal                         previously seen  Lips:                  Previously seen        Bladder:                Appears normal  Thoracic:              Appears normal         Spine:                  Limited views                                                                         previously seen  Heart:                 Previously seen        Upper Extremities:      Previously seen  RVOT:                  Previously seen        Lower Extremities:      Previously seen  LVOT:                  Previously seen  Other:  Fetus appears to be a female. Heels and Nasal bone previously          visualized. Technically difficult due to maternal habitus and fetal          position. ---------------------------------------------------------------------- Cervix Uterus Adnexa  Cervix  Not visualized (advanced GA >24wks) ---------------------------------------------------------------------- Comments  U/S images reviewed. Findings reviewed with patient.  Appropriate fetal growth is seen.  No fetal abnormalities are  identified.  Patient c/o low back strain and discomfort.   A role for a  maternity girdle to restore alignment and decrease pain and  discomfort was made.   Questions answered.  Translator utilized.  15 minutes spent face to face with patient.  Recommendations: 1) Weekly BPP 2) Serial U/S every 4  weeks for fetal growth 3) Maternity Girdle ---------------------------------------------------------------------- Recommendations    1) Weekly BPP 2) Serial U/S every 4 weeks for fetal growth  3) Maternity Girdle ----------------------------------------------------------------------               Lajoyce Lauber, MD Electronically Signed Final Report   09/10/2018 04:20 pm ----------------------------------------------------------------------    Assessment and Plan: Patient Active Problem List   Diagnosis Date Noted  . Oligohydramnios antepartum, third trimester, other fetus 10/08/2018  . Obesity in pregnancy 09/21/2018  . Language barrier affecting health care 09/21/2018  . Supervision of high risk pregnancy, antepartum, second trimester 05/06/2018  . Gestational diabetes 05/06/2018  . Calculus of gallbladder and bile duct without cholecystitis or obstruction 08/24/2015  .  Hypertriglyceridemia 07/12/2014  . Cholelithiases 01/03/2014   Admit to Antenatal Betamethasone x 2 doses GBS swab  LR maintenance plus encourage PO hydration Continue current dose of insulin; will increase per CBGs BPP in 48-72 hours  Routine antenatal care  Aura Camps, MD [facility], [facility]

## 2018-10-08 NOTE — Progress Notes (Signed)
   PRENATAL VISIT NOTE  Subjective:  Shawna [name] is a 33 y.o. [phone] at [redacted]w[redacted]d being seen today for ongoing prenatal care.  She is currently monitored for the following issues for this high-risk pregnancy and has Cholelithiases; Hypertriglyceridemia; Calculus of gallbladder and bile duct without cholecystitis or obstruction; Supervision of high risk pregnancy, antepartum, second trimester; Gestational diabetes; Obesity in pregnancy; and Language barrier affecting health care on their problem list.  Patient reports backache.  Contractions: Not present. Vag. Bleeding: None.  Movement: Present. Denies leaking of fluid.   The following portions of the patient's history were reviewed and updated as appropriate: allergies, current medications, past family history, past medical history, past social history, past surgical history and problem list. Problem list updated.  Objective:   Vitals:   10/08/18 1335  BP: 109/60  Pulse: 77  Weight: 217 lb 9.6 oz (98.7 kg)    Fetal Status: Fetal Heart Rate (bpm): NST   Movement: Present     General:  Alert, oriented and cooperative. Patient is in no acute distress.  Skin: Skin is warm and dry. No rash noted.   Cardiovascular: Normal heart rate noted  Respiratory: Normal respiratory effort, no problems with respiration noted  Abdomen: Soft, gravid, appropriate for gestational age.  Pain/Pressure: Present     Pelvic: Cervical exam deferred        Extremities: Normal range of motion.     Mental Status: Normal mood and affect. Normal behavior. Normal judgment and thought content.   Assessment and Plan:  Pregnancy: [id number] at [redacted]w[redacted]d  1. Supervision of high risk pregnancy, antepartum, second trimester Patient is doing well without complaints Rx maternity support belt provided to help with back pain and onset of right sciatic nerve pain Patient advised to stretch and to remain physically active  2. Insulin controlled gestational diabetes mellitus  (GDM) in third trimester CBGs all within range on insulin. Continue at current dosage Follow-up growth ultrasound today NST reviewed and reactive  3. Obesity in pregnancy   4. Language barrier affecting health care   Preterm labor symptoms and general obstetric precautions including but not limited to vaginal bleeding, contractions, leaking of fluid and fetal movement were reviewed in detail with the patient. Please refer to After Visit Summary for other counseling recommendations.  Return in about 1 week (around 10/15/2018) for [employer] and NST/BPP weekly.  Future Appointments  Date Time Provider Department Center  10/08/2018  2:15 PM Tonnia Bardin, Gigi Gin, MD [facility] WOC  10/08/2018  3:30 PM [facility]-M[facility] [place] 1 [facility]-M[facility]US M[facility]-US    Catalina Antigua, MD]

## 2018-10-09 ENCOUNTER — Other Ambulatory Visit: Payer: Self-pay

## 2018-10-09 LAB — GLUCOSE, CAPILLARY
Glucose-Capillary: 121 mg/dL — ABNORMAL HIGH (ref 70–99)
Glucose-Capillary: 157 mg/dL — ABNORMAL HIGH (ref 70–99)
Glucose-Capillary: 108 mg/dL — ABNORMAL HIGH (ref 70–99)
Glucose-Capillary: 122 mg/dL — ABNORMAL HIGH (ref 70–99)
Glucose-Capillary: 124 mg/dL — ABNORMAL HIGH (ref 70–99)

## 2018-10-09 NOTE — Progress Notes (Signed)
Patient ID: Shawna [name], female   DOB: September 30, 1985, 33 y.o.   MRN: [id number] ACULTY PRACTICE ANTEPARTUM COMPREHENSIVE PROGRESS NOTE  Shawna [name] is a 33 y.o. [id number] at [redacted]w[redacted]d  who is admitted for oligo.   Fetal presentation is cephalic. Length of Stay:  1  Days  Subjective: Pt without complaints this morning.  Patient reports good fetal movement.  She reports no uterine contractions, no bleeding and no loss of fluid per vagina.  Vitals:  Blood pressure 108/61, pulse 62, temperature 98.2 F (36.8 C), temperature source Oral, resp. rate 18, height 5' 5.5" (1.664 m), weight 99.8 kg, last menstrual period 12/24/2017, SpO2 99 %, unknown if currently breastfeeding.   Physical Examination: Lungs clear Heart RRR Abd soft + BS Ext non tender  Fetal Monitoring:  120-130's + accels  Labs:  Results for orders placed or performed during the hospital encounter of 10/08/18 (from the past 24 hour(s))  CBC   Collection Time: 10/08/18  9:24 PM  Result Value Ref Range   WBC 7.6 4.0 - 10.5 K/uL   RBC 3.92 3.87 - 5.11 MIL/uL   Hemoglobin 12.1 12.0 - 15.0 g/dL   HCT 78.2 42.3 - 53.6 %   MCV 92.1 80.0 - 100.0 fL   MCH 30.9 26.0 - 34.0 pg   MCHC 33.5 30.0 - 36.0 g/dL   RDW 14.4 31.5 - 40.0 %   Platelets 315 150 - 400 K/uL   nRBC 0.0 0.0 - 0.2 %  Comprehensive metabolic panel   Collection Time: 10/08/18  9:24 PM  Result Value Ref Range   Sodium 138 135 - 145 mmol/L   Potassium 4.9 3.5 - 5.1 mmol/L   Chloride 105 98 - 111 mmol/L   CO2 23 22 - 32 mmol/L   Glucose, Bld 113 (H) 70 - 99 mg/dL   BUN 17 6 - 20 mg/dL   Creatinine, Ser 8.67 0.44 - 1.00 mg/dL   Calcium 9.0 8.9 - 61.9 mg/dL   Total Protein 6.3 (L) 6.5 - 8.1 g/dL   Albumin 2.9 (L) 3.5 - 5.0 g/dL   AST 32 15 - 41 U/L   ALT 18 0 - 44 U/L   Alkaline Phosphatase 137 (H) 38 - 126 U/L   Total Bilirubin 1.0 0.3 - 1.2 mg/dL   GFR calc non Af Amer >60 >60 mL/min   GFR calc Af Amer >60 >60 mL/min   Anion gap 10 5 - 15  Type and  screen [facility]   Collection Time: 10/08/18  9:24 PM  Result Value Ref Range   ABO/RH(D) O POS    Antibody Screen NEG    Sample Expiration      10/11/2018 Performed at [facility], [address], [place], [place] [address]   Glucose, capillary   Collection Time: 10/08/18 11:32 PM  Result Value Ref Range   Glucose-Capillary 96 70 - 99 mg/dL  Glucose, capillary   Collection Time: 10/09/18  8:00 AM  Result Value Ref Range   Glucose-Capillary 121 (H) 70 - 99 mg/dL  Results for orders placed or performed in visit on 10/08/18 (from the past 24 hour(s))  POCT urinalysis dip (device)   Collection Time: 10/08/18  1:30 PM  Result Value Ref Range   Glucose, UA NEGATIVE NEGATIVE mg/dL   Bilirubin Urine NEGATIVE NEGATIVE   Ketones, ur NEGATIVE NEGATIVE mg/dL   Specific Gravity, Urine 1.020 1.005 - 1.030   Hgb urine dipstick NEGATIVE NEGATIVE   pH 7.0 5.0 -  8.0   Protein, ur NEGATIVE NEGATIVE mg/dL   Urobilinogen, UA 0.2 0.0 - 1.0 mg/dL   Nitrite NEGATIVE NEGATIVE   Leukocytes, UA NEGATIVE NEGATIVE    Imaging Studies:       Medications:  Scheduled . aspirin EC  81 mg Oral Daily  . betamethasone acetate-betamethasone sodium phosphate  12 mg Intramuscular Q24 Hr x 2  . docusate sodium  100 mg Oral Daily  . enoxaparin (LOVENOX) injection  40 mg Subcutaneous Q24H  . insulin NPH Human  22 Units Subcutaneous BID AC & HS  . prenatal multivitamin  1 tablet Oral Q1200   I have reviewed the patient's current medications.  ASSESSMENT: IUP 35 3/7 Oligo GDM, A2DM  PLAN: Stable. Continue with IV fluids and oral hydration. BMZ x 1, repeat later today. Follow CBG's, on home insulin regiment. Repeat BPP/AFI tomorrow. Continue routine antenatal care.   Hermina StaggersMichael L Shermaine Brigham 10/09/2018,11:26 AM

## 2018-10-09 NOTE — Progress Notes (Signed)
Initial Nutrition Assessment  DOCUMENTATION CODES:  Obesity unspecified  INTERVENTION:  Gestational diabetic diet/ double protein portions  NUTRITION DIAGNOSIS:  Altered nutrition lab value related to (GDM) as evidenced by (elevated GTT).  GOAL:  Patient will meet greater than or equal to 90% of their needs  MONITOR:  Labs  REASON FOR ASSESSMENT:  Antenatal, Gestational Diabetes    ASSESSMENT:  35 3/7, adm with oglio. GDM. Weight at initial PNV 98.7 kg, 2 lbs weight gain to date. Followed in [facility]   Diet Order:   Diet Order            Diet gestational carb mod Room service appropriate? Yes; Fluid consistency: Thin  Diet effective now              EDUCATION NEEDS:   No education needs have been identified at this time(Diet/medication mgt outpt docc on 8/6,9/3,10/10,10/17, 11/14)  Skin:  Skin Assessment: Reviewed RN Assessment   Height:   Ht Readings from Last 1 Encounters:  10/08/18 5' 5.5" (1.664 m)    Weight:   Wt Readings from Last 1 Encounters:  10/08/18 99.8 kg    Ideal Body Weight:   127 lbs  BMI:  Body mass index is 36.05 kg/m.  Estimated Nutritional Needs:   Kcal:  2200-2400  Protein:  98-108 g  Fluid:  2.5 L    Elisabeth Cara M.Odis Luster LDN Neonatal Nutrition Support Specialist/RD III Pager [phone]      Phone [phone]

## 2018-10-10 ENCOUNTER — Inpatient Hospital Stay (HOSPITAL_BASED_OUTPATIENT_CLINIC_OR_DEPARTMENT_OTHER): Payer: Medicaid Other

## 2018-10-10 DIAGNOSIS — O24414 Gestational diabetes mellitus in pregnancy, insulin controlled: Secondary | ICD-10-CM

## 2018-10-10 DIAGNOSIS — O4100X0 Oligohydramnios, unspecified trimester, not applicable or unspecified: Secondary | ICD-10-CM

## 2018-10-10 DIAGNOSIS — O99212 Obesity complicating pregnancy, second trimester: Secondary | ICD-10-CM

## 2018-10-10 DIAGNOSIS — Z3A35 35 weeks gestation of pregnancy: Secondary | ICD-10-CM

## 2018-10-10 LAB — CULTURE, BETA STREP (GROUP B ONLY)

## 2018-10-10 LAB — GLUCOSE, CAPILLARY
GLUCOSE-CAPILLARY: 121 mg/dL — AB (ref 70–99)
Glucose-Capillary: 121 mg/dL — ABNORMAL HIGH (ref 70–99)
Glucose-Capillary: 109 mg/dL — ABNORMAL HIGH (ref 70–99)
Glucose-Capillary: 122 mg/dL — ABNORMAL HIGH (ref 70–99)
Glucose-Capillary: 115 mg/dL — ABNORMAL HIGH (ref 70–99)

## 2018-10-10 MED ORDER — INSULIN NPH (HUMAN) (ISOPHANE) 100 UNIT/ML ~~LOC~~ SUSP
24.0000 [IU] | Freq: Two times a day (BID) | SUBCUTANEOUS | Status: DC
  Administered 2018-10-10 – 2018-10-11 (×2): 24 [IU] via SUBCUTANEOUS

## 2018-10-10 NOTE — Progress Notes (Signed)
Patient ID: Shawna [name], female   DOB: December 19, 1985, 33 y.o.   MRN: [id number] FACULTY PRACTICE ANTEPARTUM(COMPREHENSIVE) NOTE  Shawna [name] is a 33 y.o. [id number] at [redacted]w[redacted]d by best clinical estimate who is admitted for oligohydramnios.   Fetal presentation is cephalic. Length of Stay:  2  Days  Subjective:  Patient reports the fetal movement as active. Patient reports uterine contraction  activity as none. Patient reports  vaginal bleeding as none. Patient describes fluid per vagina as None.  Vitals:  Blood pressure 117/72, pulse 92, temperature 98 F (36.7 C), temperature source Oral, resp. rate 17, height 5' 5.5" (1.664 m), weight 99.8 kg, last menstrual period 12/24/2017, SpO2 99 %, unknown if currently breastfeeding. Physical Examination:  General appearance - alert, well appearing, and in no distress Heart - normal rate and regular rhythm Abdomen - soft, nontender, nondistended Fundal Height:  size equals dates Cervical Exam: Not evaluated. Extremities: extremities normal, atraumatic, no cyanosis or edema and Homans sign is negative, no sign of DVT  Membranes:intact  Fetal Monitoring:  Baseline: 130 bpm, Variability: Good {> 6 bpm), Accelerations: Reactive and Decelerations: Absent  Labs:  Results for orders placed or performed during the hospital encounter of 10/08/18 (from the past 24 hour(s))  Glucose, capillary   Collection Time: 10/09/18 12:09 PM  Result Value Ref Range   Glucose-Capillary 157 (H) 70 - 99 mg/dL  Glucose, capillary   Collection Time: 10/09/18  2:08 PM  Result Value Ref Range   Glucose-Capillary 108 (H) 70 - 99 mg/dL  Glucose, capillary   Collection Time: 10/09/18  8:07 PM  Result Value Ref Range   Glucose-Capillary 122 (H) 70 - 99 mg/dL  Glucose, capillary   Collection Time: 10/09/18 10:32 PM  Result Value Ref Range   Glucose-Capillary 124 (H) 70 - 99 mg/dL   Comment 1 Notify RN   Glucose, capillary   Collection Time: 10/10/18  7:46 AM   Result Value Ref Range   Glucose-Capillary 121 (H) 70 - 99 mg/dL     Medications:  Scheduled . aspirin EC  81 mg Oral Daily  . docusate sodium  100 mg Oral Daily  . enoxaparin (LOVENOX) injection  40 mg Subcutaneous Q24H  . insulin NPH Human  22 Units Subcutaneous BID AC & HS  . prenatal multivitamin  1 tablet Oral Q1200   I have reviewed the patient's current medications.  ASSESSMENT: Patient Active Problem List   Diagnosis Date Noted  . Oligohydramnios antepartum, third trimester, other fetus 10/08/2018  . Obesity in pregnancy 09/21/2018  . Language barrier affecting health care 09/21/2018  . Supervision of high risk pregnancy, antepartum, second trimester 05/06/2018  . Gestational diabetes 05/06/2018  . Calculus of gallbladder and bile duct without cholecystitis or obstruction 08/24/2015  . Hypertriglyceridemia 07/12/2014  . Cholelithiases 01/03/2014    PLAN: BPP today, assess AFI  Scheryl Darter 10/10/2018,9:23 AM

## 2018-10-11 ENCOUNTER — Inpatient Hospital Stay (HOSPITAL_BASED_OUTPATIENT_CLINIC_OR_DEPARTMENT_OTHER): Payer: Medicaid Other

## 2018-10-11 ENCOUNTER — Encounter (HOSPITAL_COMMUNITY): Payer: Self-pay | Admitting: Obstetrics and Gynecology

## 2018-10-11 DIAGNOSIS — O4100X0 Oligohydramnios, unspecified trimester, not applicable or unspecified: Secondary | ICD-10-CM

## 2018-10-11 DIAGNOSIS — Z3A35 35 weeks gestation of pregnancy: Secondary | ICD-10-CM

## 2018-10-11 DIAGNOSIS — O24414 Gestational diabetes mellitus in pregnancy, insulin controlled: Secondary | ICD-10-CM

## 2018-10-11 DIAGNOSIS — O4103X9 Oligohydramnios, third trimester, other fetus: Secondary | ICD-10-CM

## 2018-10-11 DIAGNOSIS — O99212 Obesity complicating pregnancy, second trimester: Secondary | ICD-10-CM

## 2018-10-11 LAB — GLUCOSE, CAPILLARY
Glucose-Capillary: 96 mg/dL (ref 70–99)
Glucose-Capillary: 83 mg/dL (ref 70–99)

## 2018-10-11 NOTE — Progress Notes (Signed)
Discharge instructions given to pt. Spanish interpreter present at bedside.  Discussed signs and symptoms to report to the MD, fetal kick counts, blood sugar control upcoming appointments, and medication regimen. Pt verbalizes understanding and has no questions or concerns. Pt discharged in stable condition.

## 2018-10-11 NOTE — Progress Notes (Signed)
Daily Antepartum Note  Admission Date: 10/08/2018 Current Date: 10/11/2018 12:41 PM  Shawna [name] is a 33 y.o. [id number] @ 968w5d, HD#4, admitted for oligo.  Pregnancy complicated by: Patient Active Problem List   Diagnosis Date Noted  . Oligohydramnios antepartum, third trimester, other fetus 10/08/2018  . Obesity in pregnancy 09/21/2018  . Language barrier affecting health care 09/21/2018  . Supervision of high risk pregnancy, antepartum, second trimester 05/06/2018  . Gestational diabetes 05/06/2018  . Calculus of gallbladder and bile duct without cholecystitis or obstruction 08/24/2015  . Hypertriglyceridemia 07/12/2014  . Cholelithiases 01/03/2014    Overnight/24hr events:  none  Subjective:  No s/s of PTL, LOF  Objective:    Current Vital Signs 24h Vital Sign Ranges  T 98.4 F (36.9 C) Temp  Avg: 97.8 F (36.6 C)  Min: 97.2 F (36.2 C)  Max: 98.4 F (36.9 C)  BP (!) 94/59 BP  Min: 94/59  Max: 129/76  HR 74 Pulse  Avg: 76.7  Min: 66  Max: 84  RR 18 Resp  Avg: 17.7  Min: 16  Max: 18  SaO2 98 % Room Air SpO2  Avg: 99.2 %  Min: 98 %  Max: 100 %       24 Hour I/O Current Shift I/O  Time Ins Outs 01/25 0701 - 01/26 0700 In: 1000 [I.V.:1000] Out: -  No intake/output data recorded.   EFM 135 baseline, +accels, no decel, mod variability Toco: quiet x 2231m  Physical exam: General: Well nourished, well developed female in no acute distress. Abdomen: gravid nttp Cardiovascular: S1, S2 normal, no murmur, rub or gallop, regular rate and rhythm Respiratory: CTAB Extremities: no clubbing, cyanosis or edema Skin: Warm and dry.   Medications: Current Facility-Administered Medications  Medication Dose Route Frequency Provider Last Rate Last Dose  . acetaminophen (TYLENOL) tablet 650 mg  650 mg Oral Q4H PRN Gwenevere AbbotPhillip, Nimeka, MD   650 mg at 10/10/18 2232  . aspirin EC tablet 81 mg  81 mg Oral Daily Gwenevere AbbotPhillip, Nimeka, MD   81 mg at 10/11/18 1004  . calcium carbonate (TUMS -  dosed in mg elemental calcium) chewable tablet 400 mg of elemental calcium  2 tablet Oral Q4H PRN Gwenevere AbbotPhillip, Nimeka, MD      . docusate sodium (COLACE) capsule 100 mg  100 mg Oral Daily Gwenevere AbbotPhillip, Nimeka, MD   100 mg at 10/11/18 1004  . enoxaparin (LOVENOX) injection 40 mg  40 mg Subcutaneous Q24H Gwenevere AbbotPhillip, Nimeka, MD   40 mg at 10/10/18 2221  . insulin NPH Human (HUMULIN N,NOVOLIN N) injection 24 Units  24 Units Subcutaneous BID AC & HS Adam PhenixArnold, James G, MD   24 Units at 10/11/18 [phone]  . lactated ringers infusion   Intravenous Continuous Gwenevere AbbotPhillip, Nimeka, MD 125 mL/hr at 10/11/18 0931    . prenatal multivitamin tablet 1 tablet  1 tablet Oral Q1200 Gwenevere AbbotPhillip, Nimeka, MD   1 tablet at 10/11/18 1143  . zolpidem (AMBIEN) tablet 5 mg  5 mg Oral QHS PRN Gwenevere AbbotPhillip, Nimeka, MD        Labs:  Recent Labs  Lab 10/08/18 2124  WBC 7.6  HGB 12.1  HCT 36.1  PLT 315    Recent Labs  Lab 10/08/18 2124  NA 138  K 4.9  CL 105  CO2 23  BUN 17  CREATININE 0.50  CALCIUM 9.0  PROT 6.3*  BILITOT 1.0  ALKPHOS 137*  ALT 18  AST 32  GLUCOSE 113*   Results for [name], Shawna  Shawna [name] (MRN [id number]) as of 10/11/2018 12:44  Ref. Range 10/10/2018 20:07 10/10/2018 22:10 10/11/2018 06:02 10/11/2018 10:06 10/11/2018 10:57  Glucose-Capillary Latest Ref Range: 70 - 99 mg/dL 409122 (H) 811115 (H) 96 83     Radiology: AFI 10.16, cephalic, 6/8 (breathing)  Assessment & Plan:  Pt doing well *Pregnancy: routine care *Oligo: resolved. [facility] precautions reviewed with her. Has rpt bpp and HROB visit on 1/30 *DM2: continue current regimen *Preterm: s/p bmz on 1/23 and 1/24 *PPx: SCDs, OOB ad lib, lovenox *FEN/GI: DM diet *Dispo: home today  Interpreter used  Cornelia Copaharlie Artavis Cowie, Jr. MD Attending Center [facility](Faculty Practice)

## 2018-10-11 NOTE — Discharge Summary (Signed)
Discharge Summary   Admit Date: 10/08/2018 Discharge Date: 10/11/2018 Discharging Service: Antepartum  Primary OBGYN: [place] Admitting Physician: Lavonia Drafts, MD  Discharge Physician: Ilda Basset  Referring Provider: MFM  Primary Care Provider: Patient, No Pcp Per  Admission Diagnoses: *Pregnancy at 35/2 *Oligohydramnios *GDM-A2 (insulin) *BMI 30s  Discharge Diagnoses: *Pregnancy at 35/5 *Resolved oligohydramnios *GDMA2 *BMI 30s  Consult Orders: None   Surgeries/Procedures Performed: Ultrasounds  History and Physical: Attestation signed by Lavonia Drafts, MD at 10/08/2018 11:27 PM  Attestation of Attending Supervision of Fellow: Evaluation and management procedures were performed by the Fellow under my supervision and collaboration.  I have reviewed the Fellow's note and chart, and I agree with the management and plan.  Carolyn L. Harraway-Smith, M.D., FACOG       Expand All Collapse All    FACULTY PRACTICE ANTEPARTUM ADMISSION HISTORY AND PHYSICAL NOTE   History of Present Illness: Shawna [name] is a 33 y.o. [phone] at 18w2dadmitted for oligohydramnios (AFI 4.19 on 10/08/2018).  She is being monitored for GDMA2 treated with insulin (NPH 22 units BID), which has been well controlled. She reports feeling well aside from mild pelvic pain and R leg pain.  Her biggest baby was 7lb10oz, 2 years ago. This baby is measuring 5lb (29th%tile on 1/23).  Patient reports the fetal movement as active. Patient reports uterine contraction  activity as none. Patient reports  vaginal bleeding as none. Patient describes fluid per vagina as None. Fetal presentation is cephalic.      Patient Active Problem List   Diagnosis Date Noted  . Oligohydramnios antepartum, third trimester, other fetus 10/08/2018  . Obesity in pregnancy 09/21/2018  . Language barrier affecting health care 09/21/2018  . Supervision of high risk pregnancy, antepartum, second  trimester 05/06/2018  . Gestational diabetes 05/06/2018  . Calculus of gallbladder and bile duct without cholecystitis or obstruction 08/24/2015  . Hypertriglyceridemia 07/12/2014  . Cholelithiases 01/03/2014        Past Medical History:  Diagnosis Date  . Diabetes mellitus without complication ([place]   . Gallstone   . Gestational diabetes   . Pregnant          Past Surgical History:  Procedure Laterality Date  . NO PAST SURGERIES                      OB History  Gravida Para Term Preterm AB Living  7 3 3  0 3 3  SAB TAB Ectopic Multiple Live Births     3 0 0 0 3       # Outcome Date GA Lbr Len/2nd Weight Sex Delivery Anes PTL Lv  7 Current           6 Term 06/25/16 429w0d4:19 / 00:27 3485 g M Vag-Spont EPI  LIV  5 SAB           4 SAB           3 SAB           2 Term      Vag-Spont   LIV  1 Term      Vag-Spont   LIV    Social History        Socioeconomic History  . Marital status: Single    Spouse name: Not on file  . Number of children: Not on file  . Years of education: Not on file  . Highest education level: Not on file  Occupational History  . Not on file  Social Needs  . Financial resource strain: Not on file  . Food insecurity:    Worry: Not on file    Inability: Not on file  . Transportation needs:    Medical: Not on file    Non-medical: Not on file  Tobacco Use  . Smoking status: Never Smoker  . Smokeless tobacco: Never Used  Substance and Sexual Activity  . Alcohol use: No  . Drug use: No  . Sexual activity: Yes    Birth control/protection: None  Lifestyle  . Physical activity:    Days per week: Not on file    Minutes per session: Not on file  . Stress: Not on file  Relationships  . Social connections:    Talks on phone: Not on file    Gets together: Not on file    Attends religious service: Not on file    Active member of club or organization:  Not on file    Attends meetings of clubs or organizations: Not on file    Relationship status: Not on file  Other Topics Concern  . Not on file  Social History Narrative  . Not on file         Family History  Problem Relation Age of Onset  . Diabetes Mother   . Cancer Mother   . Heart disease Father   . Hypertension Father     No Known Allergies         Medications Prior to Admission  Medication Sig Dispense Refill Last Dose  . ACCU-CHEK FASTCLIX LANCETS MISC 1 each by Percutaneous route 4 (four) times daily. 100 each 12 Taking  . aspirin EC 81 MG tablet Take 1 tablet (81 mg total) by mouth daily. Take after 12 weeks for prevention of preeclampsia later in pregnancy 300 tablet 2 Taking  . Blood Glucose Monitoring Suppl (ACCU-CHEK GUIDE) w/Device KIT 1 each by Does not apply route 4 (four) times daily. 1 kit 0 Taking  . Elastic Bandages & Supports (COMFORT FIT MATERNITY SUPP MED) MISC Wear daily when ambulating 1 each 0   . glucose blood (ACCU-CHEK GUIDE) test strip Use as instructed 50 each 12 Taking  . insulin NPH Human (HUMULIN N,NOVOLIN N) 100 UNIT/ML injection Inject 0.22 mLs (22 Units total) into the skin 2 (two) times daily before a meal. 10 mL 3 Taking  . Insulin Syringe-Needle U-100 (INSULIN SYRINGE 1CC/31GX5/16") 31G X 5/16" 1 ML MISC 1 each by Does not apply route 3 (three) times daily. 90 each 1 Taking  . Prenatal Vit-Fe Fumarate-FA (PRENATAL MULTIVITAMIN) TABS tablet Take 1 tablet by mouth daily at 12 noon.   Taking    Review of Systems - Negative except as mentioned in the HPI  Vitals:  BP 114/79 (BP Location: Left Arm)   Pulse 84   Temp 98.8 F (37.1 C) (Oral)   Resp 19   LMP 12/24/2017 (Approximate)   SpO2 100%  Physical Examination: CONSTITUTIONAL: Well-developed, well-nourished female in no acute distress.  HENT:  Normocephalic, atraumatic, External right and left ear normal. Oropharynx is clear and moist EYES: Conjunctivae and EOM are  normal. Pupils are equal, round, and reactive to light. No scleral icterus.  NECK: Normal range of motion, supple, no masses SKIN: Skin is warm and dry. No rash noted. Not diaphoretic. No erythema. No pallor. [place]: Alert and oriented to person, place, and time. Normal reflexes, muscle tone coordination. No cranial nerve deficit noted. PSYCHIATRIC: Normal mood and affect. Normal behavior. Normal judgment and thought  content. CARDIOVASCULAR: Normal heart rate noted, regular rhythm RESPIRATORY: Effort and breath sounds normal, no problems with respiration noted ABDOMEN: Soft, nontender, nondistended, gravid. MUSCULOSKELETAL: Normal range of motion. No edema and no tenderness. 2+ distal pulses.  Cervix: Not evaluated  Membranes:intact Fetal Monitoring:Baseline: 145 bpm, Variability: Good {> 6 bpm), Accelerations: Reactive and Decelerations: Absent Tocometer: Flat  Labs:       Results for orders placed or performed in visit on 10/08/18 (from the past 24 hour(s))  POCT urinalysis dip (device)   Collection Time: 10/08/18  1:30 PM  Result Value Ref Range   Glucose, UA NEGATIVE NEGATIVE mg/dL   Bilirubin Urine NEGATIVE NEGATIVE   Ketones, ur NEGATIVE NEGATIVE mg/dL   Specific Gravity, Urine 1.020 1.005 - 1.030   Hgb urine dipstick NEGATIVE NEGATIVE   pH 7.0 5.0 - 8.0   Protein, ur NEGATIVE NEGATIVE mg/dL   Urobilinogen, UA 0.2 0.0 - 1.0 mg/dL   Nitrite NEGATIVE NEGATIVE   Leukocytes, UA NEGATIVE NEGATIVE    Imaging Studies:  ImagingResults  [place] Mfm Fetal Bpp Wo Non Stress  Result Date: 10/08/2018 ----------------------------------------------------------------------  OBSTETRICS REPORT                       (Signed Final 10/08/2018 04:32 pm) ---------------------------------------------------------------------- Patient Info  ID #:       [id number]                          D.O.B.:  09/24/1985 (32 yrs)  Name:       Shawna [name]              Visit Date: 10/08/2018  03:57 pm ---------------------------------------------------------------------- Performed By  Performed By:     Valda Favia          Ref. Address:     [facility]                                                             [place], [place]  [place]  Attending:        Tama High MD        Location:         [facility]  Referred By:      [facility] for                    [place] ---------------------------------------------------------------------- Orders   #  Description                          Code         Ordered By   1  [place] MFM FETAL BPP WO NON              74944.96     [place]   2  [place] MFM OB FOLLOW UP                  [id number]     Lora Paula  ----------------------------------------------------------------------   #  Order #                    Accession #                 Episode #   1  [id number]                  [id number]                  [id number]   2  [id number]                  [id number]                  [id number]  ---------------------------------------------------------------------- Indications   [redacted] weeks gestation of pregnancy                Z3A.35   Gestational diabetes in pregnancy, insulin     O24.414   controlled   Obesity complicating pregnancy, second         O99.212   trimester (Pre Pregnancy BMI 36)   History of congenital or genetic condition     Z87.798   (Nephew with T21 - declined GC)  ---------------------------------------------------------------------- Vital Signs                                                 Height:        5'5" ----------------------------------------------------------------------  Fetal Evaluation  Num Of Fetuses:         1  Fetal Heart Rate(bpm):  138  Cardiac Activity:       Observed  Presentation:           Cephalic  Placenta:               Posterior  Amniotic Fluid  AFI FV:      Oligohydramnios  AFI Sum(cm)     %Tile       Largest Pocket(cm)  4.19            < 3  1.57  RUQ(cm)       RLQ(cm)       LUQ(cm)        LLQ(cm)  0             1.37          1.57           1.25 ---------------------------------------------------------------------- Biophysical Evaluation  Amniotic F.V:   Pocket => 2 cm two         F. Tone:        Observed                  planes  F. Movement:    Observed                   Score:          8/8  F. Breathing:   Observed ---------------------------------------------------------------------- Biometry  BPD:        86  mm     G. Age:  34w 5d         36  %    CI:        75.99   %    70 - 86                                                          FL/HC:      19.3   %    20.1 - 22.3  HC:      312.7  mm     G. Age:  35w 0d         15  %    HC/AC:      1.02        0.93 - 1.11  AC:      306.2  mm     G. Age:  34w 4d         37  %    FL/BPD:     70.3   %    71 - 87  FL:       60.5  mm     G. Age:  31w 3d        < 3  %    FL/AC:      19.8   %    20 - 24  HUM:      52.8  mm     G. Age:  30w 5d        < 5  %  Est. FW:    2271  gm           5 lb     29  % ---------------------------------------------------------------------- OB History  Gravidity:    7         Term:   3        Prem:   0        SAB:   3  Ectopic:      0        Living:  3 ---------------------------------------------------------------------- Gestational Age  LMP:           41w 1d        Date:  12/24/17                 EDD:   09/30/18  U/S Today:  34w 0d                                        EDD:   11/19/18  Best:          35w 2d     Det. By:  U/S C R L  (04/29/18)    EDD:   11/10/18 ---------------------------------------------------------------------- Anatomy  Cranium:               Appears normal          Aortic Arch:            Previously seen  Cavum:                 Previously seen        Ductal Arch:            Not well visualized  Ventricles:            Appears normal         Diaphragm:              Previously seen  Choroid Plexus:        Previously seen        Stomach:                Appears normal, left                                                                        sided  Cerebellum:            Previously seen        Abdomen:                Appears normal  Posterior Fossa:       Previously seen        Abdominal Wall:         Previously seen  Nuchal Fold:           Previously seen        Cord Vessels:           Previously seen  Face:                  Orbits and profile     Kidneys:                Appear normal                         previously seen  Lips:                  Previously seen        Bladder:                Appears normal  Thoracic:              Appears normal         Spine:                  Limited views  previously seen  Heart:                 Appears normal         Upper Extremities:      Previously seen                         (4CH, axis, and situs  RVOT:                  Previously seen        Lower Extremities:      Previously seen  LVOT:                  Previously seen  Other:  Fetus appears to be a female. Heels and Nasal bone previously          visualized. Technically difficult due to maternal habitus and fetal          position. ---------------------------------------------------------------------- Cervix Uterus Adnexa  Cervix  Not visualized (advanced GA >24wks)  Left Ovary  No adnexal mass visualized.  Right Ovary  No adnexal mass visualized. ---------------------------------------------------------------------- Impression  Fetal growth is appropriate for gestational age.  Oligohydramnios is seen. Antenatal testing is reassuring.  BPP 8/8.  I explained the findings with help of language interpreter.  Patient  reports increased mucoid vaginal discharge.  Discussed with Dr. Rip Harbour. I recommend inpatient  management and reevaluation of AFI in 48 to 72 hours. ---------------------------------------------------------------------- Recommendations  Continue weekly antenatal testing till delivery. ----------------------------------------------------------------------                  Tama High, MD Electronically Signed Final Report   10/08/2018 04:32 pm ----------------------------------------------------------------------  [place] Mfm Fetal Bpp Wo Non Stress  Result Date: 10/01/2018 ----------------------------------------------------------------------  OBSTETRICS REPORT                       (Signed Final 10/01/2018 04:32 pm) ---------------------------------------------------------------------- Patient Info  ID #:       [id number]                          D.O.B.:  [birth year] (32 yrs)  Name:       Shawna [name]              Visit Date: 10/01/2018 03:29 pm ---------------------------------------------------------------------- Performed By  Performed By:     Corky Crafts             Ref. Address:     [facility]                                                             [address]  [place], [place]  Attending:        Tama High MD        Location:         [facility]  Referred By:      [facility] for                    [place] ---------------------------------------------------------------------- Orders   #  Description                          Code         Ordered By   1  [place] MFM FETAL BPP WO NON              93716.96     Lora Paula      STRESS   ----------------------------------------------------------------------   #  Order #                    Accession #                 Episode #   1  [id number]                  [id number]                  [id number]  ---------------------------------------------------------------------- Indications   Gestational diabetes in pregnancy, insulin     O24.414   controlled   Obesity complicating pregnancy, second         O99.212   trimester (Pre Pregnancy BMI 36)   History of congenital or genetic condition     Z87.798   (Nephew with T21 - declined GC)   [redacted] weeks gestation of pregnancy                Z3A.34  ---------------------------------------------------------------------- Vital Signs  Weight (lb): 216                               Height:        5'5"  BMI:         35.94 ---------------------------------------------------------------------- Fetal Evaluation  Num Of Fetuses:         1  Fetal Heart Rate(bpm):  161  Cardiac Activity:       Observed  Presentation:           Cephalic  Placenta:               Posterior  Amniotic Fluid  AFI FV:      Within normal limits  AFI Sum(cm)     %Tile       Largest Pocket(cm)  7.96            5           3.06  RUQ(cm)       RLQ(cm)       LUQ(cm)        LLQ(cm)  0             3.06          2.7            2.2 ---------------------------------------------------------------------- Biophysical Evaluation  Amniotic F.V:   Within normal limits       F. Tone:        Observed  F. Movement:    Observed                   Score:          8/8  F. Breathing:   Observed ---------------------------------------------------------------------- OB History  Gravidity:    7         Term:   3        Prem:   0        SAB:   3  Ectopic:      0        Living:  3 ---------------------------------------------------------------------- Gestational Age  LMP:           40w 1d        Date:  12/24/17                 EDD:   09/30/18  Best:          34w 2d     Det. By:  U/S C R L  (04/29/18)    EDD:   11/10/18  ---------------------------------------------------------------------- Cervix Uterus Adnexa  Cervix  Normal appearance by transabdominal scan. ---------------------------------------------------------------------- Impression  Amniotic fluid is normal and good fetal activity is seen.  Antenatal testing is reassuring. BPP 8/8. ---------------------------------------------------------------------- Recommendations  -Continue weekly antenatal testing till delivery.  -Fetal growth assessment next week. ----------------------------------------------------------------------                  Tama High, MD Electronically Signed Final Report   10/01/2018 04:32 pm ----------------------------------------------------------------------  [place] Mfm Fetal Bpp Wo Non Stress  Result Date: 09/24/2018 ----------------------------------------------------------------------  OBSTETRICS REPORT                       (Signed Final 09/24/2018 01:50 pm) ---------------------------------------------------------------------- Patient Info  ID #:       [id number]                          D.O.B.:  1986/01/22 (32 yrs)  Name:       Shawna [name]              Visit Date: 09/24/2018 12:52 pm ---------------------------------------------------------------------- Performed By  Performed By:     Valda Favia          Ref. Address:      [facility]                                                              [place]  Attending:        Sander Nephew      Location:          [facility]                    MD  Referred By:      [facility] for                    [place]  ---------------------------------------------------------------------- Orders   #  Description                          Code         Ordered By   1  [place] MFM FETAL BPP WO NON              45364.68     [place]  ----------------------------------------------------------------------   #  Order #                    Accession #                 Episode #   1  [id number]                  [id number]                  [id number]  ---------------------------------------------------------------------- Indications   Gestational diabetes in pregnancy, insulin     O24.414   controlled   Obesity complicating pregnancy, second         O99.212   trimester (Pre Pregnancy BMI 36)   History of congenital or genetic condition     Z87.798   (Nephew with T21 - declined  GC)   [redacted] weeks gestation of pregnancy                Z3A.33  ---------------------------------------------------------------------- Vital Signs                                                 Height:        5'5" ---------------------------------------------------------------------- Fetal Evaluation  Num Of Fetuses:          1  Fetal Heart Rate(bpm):   129  Cardiac Activity:        Observed  Presentation:            Cephalic  Amniotic Fluid  AFI FV:      Within normal limits  AFI Sum(cm)     %Tile       Largest Pocket(cm)  11.24           27          5.23  RUQ(cm)       RLQ(cm)       LUQ(cm)        LLQ(cm)  2.85          0             5.23           3.16 ---------------------------------------------------------------------- Biophysical Evaluation  Amniotic F.V:   Within normal limits       F. Tone:         Observed  F. Movement:    Observed                   Score:           8/8  F. Breathing:   Observed ---------------------------------------------------------------------- OB History  Gravidity:    7         Term:   3        Prem:   0        SAB:   3  Ectopic:      0        Living:  3 ----------------------------------------------------------------------  Gestational Age  LMP:           55w 1d        Date:  12/24/17                 EDD:   09/30/18  Best:          33w 2d     Det. By:  U/S C R L  (04/29/18)    EDD:   11/10/18 ---------------------------------------------------------------------- Anatomy  Stomach:               Appears normal, left   Bladder:                Appears normal                         sided ---------------------------------------------------------------------- Cervix Uterus Adnexa  Cervix  Not visualized (advanced GA >24wks) ---------------------------------------------------------------------- Impression  Biophysical profile 8/8 ---------------------------------------------------------------------- Recommendations  Continue weekly testing as previously scheduled. ----------------------------------------------------------------------               Sander Nephew, MD Electronically Signed Final Report   09/24/2018 01:50 pm ----------------------------------------------------------------------  [place] Mfm Fetal Bpp Wo Non Stress  Result Date: 09/17/2018 ----------------------------------------------------------------------  OBSTETRICS REPORT                        (  Signed Final 09/17/2018 09:27 am) ---------------------------------------------------------------------- Patient Info  ID #:       [id number]                          D.O.B.:  January 10, 1986 (32 yrs)  Name:       Shawna [name]              Visit Date: 09/17/2018 07:36 am ---------------------------------------------------------------------- Performed By  Performed By:     Rodrigo Ran BS      Ref. Address:      [facility]                    [facility]                                                              [facility]                                                              [place]                                                              [place]  Attending:        Sander Nephew      Location:          [facility]                    MD  Referred By:      [facility] for                    [place] ---------------------------------------------------------------------- Orders   #  Description                          Code  Ordered By   1  [place] MFM FETAL BPP WO NON              [id number]     Lora Paula      STRESS  ----------------------------------------------------------------------   #  Order #                    Accession #                 Episode #   1  [id number]                  [id number]                  [id number]  ---------------------------------------------------------------------- Indications   Gestational diabetes in pregnancy, insulin     O24.414   controlled   Obesity complicating pregnancy, second         O99.212   trimester (Pre Pregnancy BMI 36)   History of congenital or genetic condition     Z87.798   (Nephew with T21 - declined GC)   [redacted] weeks gestation of pregnancy                Z3A.32  ---------------------------------------------------------------------- Vital Signs                                                 Height:        5'5" ---------------------------------------------------------------------- Fetal Evaluation  Num Of Fetuses:          1  Fetal Heart Rate(bpm):   141  Cardiac Activity:        Observed  Presentation:            Cephalic  Amniotic Fluid  AFI FV:      Within normal limits  AFI Sum(cm)     %Tile       Largest Pocket(cm)  8.66            6           3.56  RUQ(cm)                     LUQ(cm)        LLQ(cm)  3.56                        3.24           1.86 ---------------------------------------------------------------------- Biophysical Evaluation  Amniotic F.V:   Within normal limits       F. Tone:         Observed  F. Movement:    Observed                   Score:           8/8  F. Breathing:   Observed  ---------------------------------------------------------------------- OB History  Gravidity:    7         Term:   3        Prem:   0        SAB:   3  Ectopic:      0        Living:  3 ---------------------------------------------------------------------- Gestational Age  LMP:           38w 1d  Date:  12/24/17                 EDD:   09/30/18  Best:          Milderd Meager 2d     Det. By:  U/S C R L  (04/29/18)    EDD:   11/10/18 ---------------------------------------------------------------------- Impression  Biophysical profiled 8/8 ---------------------------------------------------------------------- Recommendations  Continue weekly BPP (scheduled today).  Follow up growth in 3 weeks. ----------------------------------------------------------------------               Sander Nephew, MD Electronically Signed Final Report   09/17/2018 09:27 am ----------------------------------------------------------------------  [place] Mfm Ob Follow Up  Result Date: 10/08/2018 ----------------------------------------------------------------------  OBSTETRICS REPORT                       (Signed Final 10/08/2018 04:32 pm) ---------------------------------------------------------------------- Patient Info  ID #:       [id number]                          D.O.B.:  03-Mar-1986 (32 yrs)  Name:       Shawna [name]              Visit Date: 10/08/2018 03:57 pm ---------------------------------------------------------------------- Performed By  Performed By:     Valda Favia          Ref. Address:     [facility]                                                             [place], [place]  Attending:        Tama High MD         [facility]  Referred By:      [facility] for                    [place] ---------------------------------------------------------------------- Orders   #  Description                          Code         Ordered By   1  [place] MFM FETAL BPP WO NON              02233.61     [place]   2  [place] MFM OB FOLLOW UP                  22449.75     Lora Paula  ----------------------------------------------------------------------   #  Order #                    Accession #                 Episode #   1  [id number]                  [id number]   2  [id number]                  [id number]                  [id number]  ---------------------------------------------------------------------- Indications   [redacted] weeks gestation of pregnancy                Z3A.35   Gestational diabetes in pregnancy, insulin     O24.414   controlled   Obesity complicating pregnancy, second         O99.212   trimester (Pre Pregnancy BMI 36)   History of congenital or genetic condition     Z87.798   (Nephew with T21 - declined GC)  ---------------------------------------------------------------------- Vital Signs                                                 Height:        5'5" ---------------------------------------------------------------------- Fetal Evaluation  Num Of Fetuses:         1  Fetal Heart Rate(bpm):  138  Cardiac Activity:       Observed  Presentation:           Cephalic  Placenta:               Posterior  Amniotic Fluid  AFI FV:      Oligohydramnios  AFI Sum(cm)     %Tile       Largest Pocket(cm)  4.19            < 3         1.57  RUQ(cm)       RLQ(cm)       LUQ(cm)        LLQ(cm)  0  1.37          1.57           1.25 ---------------------------------------------------------------------- Biophysical Evaluation  Amniotic F.V:   Pocket => 2 cm two         F. Tone:        Observed                  planes  F.  Movement:    Observed                   Score:          8/8  F. Breathing:   Observed ---------------------------------------------------------------------- Biometry  BPD:        86  mm     G. Age:  34w 5d         36  %    CI:        75.99   %    70 - 86                                                          FL/HC:      19.3   %    20.1 - 22.3  HC:      312.7  mm     G. Age:  35w 0d         15  %    HC/AC:      1.02        0.93 - 1.11  AC:      306.2  mm     G. Age:  34w 4d         37  %    FL/BPD:     70.3   %    71 - 87  FL:       60.5  mm     G. Age:  31w 3d        < 3  %    FL/AC:      19.8   %    20 - 24  HUM:      52.8  mm     G. Age:  30w 5d        < 5  %  Est. FW:    2271  gm           5 lb     29  % ---------------------------------------------------------------------- OB History  Gravidity:    7         Term:   3        Prem:   0        SAB:   3  Ectopic:      0        Living:  3 ---------------------------------------------------------------------- Gestational Age  LMP:           41w 1d        Date:  12/24/17                 EDD:   09/30/18  U/S Today:     34w 0d  EDD:   11/19/18  Best:          35w 2d     Det. By:  U/S C R L  (04/29/18)    EDD:   11/10/18 ---------------------------------------------------------------------- Anatomy  Cranium:               Appears normal         Aortic Arch:            Previously seen  Cavum:                 Previously seen        Ductal Arch:            Not well visualized  Ventricles:            Appears normal         Diaphragm:              Previously seen  Choroid Plexus:        Previously seen        Stomach:                Appears normal, left                                                                        sided  Cerebellum:            Previously seen        Abdomen:                Appears normal  Posterior Fossa:       Previously seen        Abdominal Wall:         Previously seen  Nuchal Fold:           Previously  seen        Cord Vessels:           Previously seen  Face:                  Orbits and profile     Kidneys:                Appear normal                         previously seen  Lips:                  Previously seen        Bladder:                Appears normal  Thoracic:              Appears normal         Spine:                  Limited views  previously seen  Heart:                 Appears normal         Upper Extremities:      Previously seen                         (4CH, axis, and situs  RVOT:                  Previously seen        Lower Extremities:      Previously seen  LVOT:                  Previously seen  Other:  Fetus appears to be a female. Heels and Nasal bone previously          visualized. Technically difficult due to maternal habitus and fetal          position. ---------------------------------------------------------------------- Cervix Uterus Adnexa  Cervix  Not visualized (advanced GA >24wks)  Left Ovary  No adnexal mass visualized.  Right Ovary  No adnexal mass visualized. ---------------------------------------------------------------------- Impression  Fetal growth is appropriate for gestational age.  Oligohydramnios is seen. Antenatal testing is reassuring.  BPP 8/8.  I explained the findings with help of language interpreter.  Patient reports increased mucoid vaginal discharge.  Discussed with Dr. Rip Harbour. I recommend inpatient  management and reevaluation of AFI in 48 to 72 hours. ---------------------------------------------------------------------- Recommendations  Continue weekly antenatal testing till delivery. ----------------------------------------------------------------------                  Tama High, MD Electronically Signed Final Report   10/08/2018 04:32 pm ----------------------------------------------------------------------  [place] Mfm Ob Follow Up  Result Date:  09/10/2018 ----------------------------------------------------------------------  OBSTETRICS REPORT                       (Signed Final 09/10/2018 04:20 pm) ---------------------------------------------------------------------- Patient Info  ID #:       [id number]                          D.O.B.:  1985/10/03 (32 yrs)  Name:       Shawna [name]              Visit Date: 09/10/2018 03:46 pm ---------------------------------------------------------------------- Performed By  Performed By:     Jeanene Erb BS,      Ref. Address:     [facility]                                                             [place]  [place]  Attending:        Lajoyce Lauber      Location:         [facility]                    MD  Referred By:      Judieth Keens NP ---------------------------------------------------------------------- Orders   #  Description                          Code         Ordered By   1  [place] MFM OB FOLLOW UP                  91478.29     Sander Nephew  ----------------------------------------------------------------------   #  Order #                    Accession #                 Episode #   1  [id number]                  [id number]                  [id number]  ---------------------------------------------------------------------- Indications   Gestational diabetes in pregnancy, insulin     O24.414   controlled   Obesity complicating pregnancy, second         O99.212   trimester (Pre Pregnancy BMI 36)   History of congenital or genetic condition     Z87.798   (Nephew with T21 - declined GC)   [redacted] weeks gestation of pregnancy                Z3A.31  ---------------------------------------------------------------------- Vital Signs  Weight (lb): 218                                Height:        5'5"  BMI:         36.27 ---------------------------------------------------------------------- Fetal Evaluation  Num Of Fetuses:         1  Fetal Heart Rate(bpm):  143  Cardiac Activity:       Observed  Presentation:           Cephalic  Placenta:               Posterior  P. Cord Insertion:      Previously Visualized  Amniotic Fluid  AFI FV:      Within normal limits  AFI Sum(cm)     %Tile       Largest Pocket(cm)  13.68           44          3.91  RUQ(cm)       RLQ(cm)       LUQ(cm)        LLQ(cm)  3.91  3.07          3.63           3.07 ---------------------------------------------------------------------- Biophysical Evaluation  Amniotic F.V:   Within normal limits       F. Tone:        Observed  F. Movement:    Observed                   Score:          8/8  F. Breathing:   Observed ---------------------------------------------------------------------- Biometry  BPD:      80.2  mm     G. Age:  32w 1d         68  %    CI:         77.4   %    70 - 86                                                          FL/HC:      20.2   %    19.3 - 21.3  HC:      288.6  mm     G. Age:  31w 5d         26  %    HC/AC:      1.03        0.96 - 1.17  AC:      279.7  mm     G. Age:  32w 0d         69  %    FL/BPD:     72.6   %    71 - 87  FL:       58.2  mm     G. Age:  30w 3d         17  %    FL/AC:      20.8   %    20 - 24  Est. FW:    1784  gm    3 lb 15 oz      60  % ---------------------------------------------------------------------- OB History  Gravidity:    7         Term:   3        Prem:   0        SAB:   3  Ectopic:      0        Living:  3 ---------------------------------------------------------------------- Gestational Age  LMP:           37w 1d        Date:  12/24/17                 EDD:   09/30/18  U/S Today:     31w 4d                                        EDD:   11/08/18  Best:          31w 2d     Det. By:  U/S C R L  (04/29/18)    EDD:   11/10/18  ---------------------------------------------------------------------- Anatomy  Cranium:  Appears normal         Aortic Arch:            Previously seen  Cavum:                 Previously seen        Ductal Arch:            Not well visualized  Ventricles:            Previously seen        Diaphragm:              Previously seen  Choroid Plexus:        Previously seen        Stomach:                Appears normal, left                                                                        sided  Cerebellum:            Previously seen        Abdomen:                Appears normal  Posterior Fossa:       Previously seen        Abdominal Wall:         Previously seen  Nuchal Fold:           Previously seen        Cord Vessels:           Previously seen  Face:                  Orbits and profile     Kidneys:                Appear normal                         previously seen  Lips:                  Previously seen        Bladder:                Appears normal  Thoracic:              Appears normal         Spine:                  Limited views                                                                        previously seen  Heart:                 Previously seen        Upper Extremities:      Previously seen  RVOT:  Previously seen        Lower Extremities:      Previously seen  LVOT:                  Previously seen  Other:  Fetus appears to be a female. Heels and Nasal bone previously          visualized. Technically difficult due to maternal habitus and fetal          position. ---------------------------------------------------------------------- Cervix Uterus Adnexa  Cervix  Not visualized (advanced GA >24wks) ---------------------------------------------------------------------- Comments  U/S images reviewed. Findings reviewed with patient.  Appropriate fetal growth is seen.  No fetal abnormalities are  identified.  Patient c/o low back strain and discomfort.   A role for a   maternity girdle to restore alignment and decrease pain and  discomfort was made.   Questions answered.  Translator utilized.  15 minutes spent face to face with patient.  Recommendations: 1) Weekly BPP 2) Serial U/S every 4  weeks for fetal growth 3) Maternity Girdle ---------------------------------------------------------------------- Recommendations    1) Weekly BPP 2) Serial U/S every 4 weeks for fetal growth  3) Maternity Girdle ----------------------------------------------------------------------               Lajoyce Lauber, MD Electronically Signed Final Report   09/10/2018 04:20 pm ----------------------------------------------------------------------     Assessment and Plan:     Patient Active Problem List   Diagnosis Date Noted  . Oligohydramnios antepartum, third trimester, other fetus 10/08/2018  . Obesity in pregnancy 09/21/2018  . Language barrier affecting health care 09/21/2018  . Supervision of high risk pregnancy, antepartum, second trimester 05/06/2018  . Gestational diabetes 05/06/2018  . Calculus of gallbladder and bile duct without cholecystitis or obstruction 08/24/2015  . Hypertriglyceridemia 07/12/2014  . Cholelithiases 01/03/2014   Admit to Antenatal Betamethasone x 2 doses GBS swab  LR maintenance plus encourage PO hydration Continue current dose of insulin; will increase per CBGs BPP in 48-72 hours  Routine antenatal care  Aura Camps, MD [facility] [place] by: Lavonia Drafts, MD at 10/08/2018 11:27 PM  Electronically signed by Aura Camps, MD at 10/08/2018 11:22 PM Electronically signed by Lavonia Drafts, MD at 10/08/2018 11:27 PM    Hospital Course: Patient had slight oligo during her hospital course but on the day of discharge had an AFI of 10 for a bpp of 10/10. Patient was discharged to home.  Patient's blood sugars controlled on home regimen.   Discharge  Exam:   Current Vital Signs 24h Vital Sign Ranges  T 98.4 F (36.9 C) Temp  Avg: 97.8 F (36.6 C)  Min: 97.2 F (36.2 C)  Max: 98.4 F (36.9 C)  BP (!) 94/59 BP  Min: 94/59  Max: 129/76  HR 74 Pulse  Avg: 76.7  Min: 66  Max: 84  RR 18 Resp  Avg: 17.7  Min: 16  Max: 18  SaO2 98 % Room Air SpO2  Avg: 99.2 %  Min: 98 %  Max: 100 %       24 Hour I/O Current Shift I/O  Time Ins Outs 01/25 0701 - 01/26 0700 In: 1000 [I.V.:1000] Out: -  No intake/output data recorded.    General appearance: Well nourished, well developed female in no acute distress.  Gravid, nttp  Discharge Disposition:  Home  Patient Instructions:  Standard   Results Pending at Discharge:  Final read of ultrasound  Discharge Medications: Allergies as of 10/11/2018   No Known Allergies     Medication List    TAKE these medications   ACCU-CHEK FASTCLIX LANCETS Misc 1 each by Percutaneous route 4 (four) times daily.   ACCU-CHEK GUIDE w/Device Kit 1 each by Does not apply route 4 (four) times daily.   aspirin EC 81 MG tablet Take 1 tablet (81 mg total) by mouth daily. Take after 12 weeks for prevention of preeclampsia later in pregnancy   COMFORT FIT MATERNITY SUPP MED Misc Wear daily when ambulating   glucose blood test strip Commonly known as:  ACCU-CHEK GUIDE Use as instructed   insulin NPH Human 100 UNIT/ML injection Commonly known as:  HUMULIN N,NOVOLIN N Inject 0.22 mLs (22 Units total) into the skin 2 (two) times daily before a meal.   INSULIN SYRINGE 1CC/31GX5/16" 31G X 5/16" 1 ML Misc 1 each by Does not apply route 3 (three) times daily.   prenatal multivitamin Tabs tablet Take 1 tablet by mouth daily at 12 noon.        Future Appointments  Date Time Provider [place]  10/15/2018  3:15 PM [facility] NST [facility] WOC  10/15/2018  4:15 PM Lavonia Drafts, MD [facility] WOC    Durene Romans MD Attending Center for [facility])

## 2018-10-11 NOTE — Discharge Instructions (Signed)
Evaluación de los movimientos fetales °Fetal Movement Counts °Introducción °Nombre del paciente: ________________________________________________ Fecha de parto estimada: ____________________ °¿Qué es una evaluación de los movimientos fetales? ° °Una evaluación de los movimientos fetales es el registro del número de veces que siente que el bebé se mueve durante un cierto período de tiempo. Esto también se puede denominar recuento de patadas fetales. Una evaluación de movimientos fetales se recomienda a todas las embarazadas. Es posible que le indiquen que comience a evaluar los movimientos fetales desde la semana 28 de embarazo. °Preste atención cuando sienta que el bebé está más activo. Podrá detectar los ciclos en que el bebé duerme y está despierto. También podrá detectar que ciertas cosas hacen que su bebé se mueva más. Deberá realizar una evaluación de los movimientos fetales en las siguientes situaciones: °· Cuando el bebé está más activo habitualmente. °· A la misma hora, todos los días. °Un buen momento para evaluar los movimientos fetales es cuando está descansando, después de haber comido y bebido algo. °¿Cómo debo contar los movimientos fetales? °1. Encuentre un lugar tranquilo y cómodo. Siéntese o acuéstese de lado. °2. Anote la fecha, la hora de inicio y de finalización y la cantidad de movimientos que sintió entre esas dos horas. Lleve esta información a las visitas de control. °3. Cuente las pataditas, revoloteos, chasquidos, vueltas o pinchazos en un período de 2 horas. Debe sentir al menos 10 movimientos en 2 horas. °4. Cuando sienta 10 movimientos, puede dejar de contar. °5. Si no siente 10 movimientos en 2 horas, coma y beba algo. Luego, continúe descansando y contando durante 1 hora. Si siente al menos 4 movimientos durante esa hora, puede dejar de contar. °Comuníquese con un médico si: °· Siente menos de 4 movimientos en 2 horas. °· El bebé no se mueve tanto como suele hacerlo. °Fecha:  ____________ Hora de inicio: ____________ Hora de finalización: ____________ Movimientos: ____________ °Fecha: ____________ Hora de inicio: ____________ Hora de finalización: ____________ Movimientos: ____________ °Fecha: ____________ Hora de inicio: ____________ Hora de finalización: ____________ Movimientos: ____________ °Fecha: ____________ Hora de inicio: ____________ Hora de finalización: ____________ Movimientos: ____________ °Fecha: ____________ Hora de inicio: ____________ Hora de finalización: ____________ Movimientos: ____________ °Fecha: ____________ Hora de inicio: ____________ Hora de finalización: ____________ Movimientos: ____________ °Fecha: ____________ Hora de inicio: ____________ Hora de finalización: ____________ Movimientos: ____________ °Fecha: ____________ Hora de inicio: ____________ Hora de finalización: ____________ Movimientos: ____________ °Fecha: ____________ Hora de inicio: ____________ Hora de finalización: ____________ Movimientos: ____________ °Esta información no tiene como fin reemplazar el consejo del médico. Asegúrese de hacerle al médico cualquier pregunta que tenga. °Document Released: 12/10/2007 Document Revised: 12/06/2016 Document Reviewed: 10/12/2015 °Elsevier Interactive Patient Education © 2019 [employer] ° °

## 2018-10-15 ENCOUNTER — Ambulatory Visit: Payer: Self-pay

## 2018-10-15 ENCOUNTER — Encounter: Payer: Self-pay | Admitting: *Deleted

## 2018-10-15 ENCOUNTER — Ambulatory Visit (INDEPENDENT_AMBULATORY_CARE_PROVIDER_SITE_OTHER): Payer: Self-pay | Admitting: Obstetrics & Gynecology

## 2018-10-15 ENCOUNTER — Ambulatory Visit (INDEPENDENT_AMBULATORY_CARE_PROVIDER_SITE_OTHER): Payer: Self-pay | Admitting: *Deleted

## 2018-10-15 ENCOUNTER — Other Ambulatory Visit: Payer: Self-pay | Admitting: Obstetrics & Gynecology

## 2018-10-15 VITALS — BP 113/73 | HR 95 | Wt 218.7 lb

## 2018-10-15 DIAGNOSIS — O4103X0 Oligohydramnios, third trimester, not applicable or unspecified: Secondary | ICD-10-CM

## 2018-10-15 DIAGNOSIS — O24414 Gestational diabetes mellitus in pregnancy, insulin controlled: Secondary | ICD-10-CM

## 2018-10-15 DIAGNOSIS — O4100X0 Oligohydramnios, unspecified trimester, not applicable or unspecified: Secondary | ICD-10-CM

## 2018-10-15 DIAGNOSIS — O0992 Supervision of high risk pregnancy, unspecified, second trimester: Secondary | ICD-10-CM

## 2018-10-15 DIAGNOSIS — O0990 Supervision of high risk pregnancy, unspecified, unspecified trimester: Secondary | ICD-10-CM

## 2018-10-15 DIAGNOSIS — O99213 Obesity complicating pregnancy, third trimester: Secondary | ICD-10-CM

## 2018-10-15 DIAGNOSIS — O0993 Supervision of high risk pregnancy, unspecified, third trimester: Secondary | ICD-10-CM

## 2018-10-15 DIAGNOSIS — O99210 Obesity complicating pregnancy, unspecified trimester: Secondary | ICD-10-CM

## 2018-10-15 DIAGNOSIS — O4103X9 Oligohydramnios, third trimester, other fetus: Secondary | ICD-10-CM

## 2018-10-15 LAB — POCT URINALYSIS DIP (DEVICE)
Bilirubin Urine: NEGATIVE
Glucose, UA: NEGATIVE mg/dL
Hgb urine dipstick: NEGATIVE
Ketones, ur: NEGATIVE mg/dL
NITRITE: NEGATIVE
Protein, ur: NEGATIVE mg/dL
Specific Gravity, Urine: 1.025 (ref 1.005–1.030)
Urobilinogen, UA: 0.2 mg/dL (ref 0.0–1.0)
pH: 6 (ref 5.0–8.0)

## 2018-10-15 NOTE — Progress Notes (Signed)
   PRENATAL VISIT NOTE  Subjective:  Shawna [name] is a 33 y.o. [phone] at [redacted]w[redacted]d being seen today for ongoing prenatal care.  She is currently monitored for the following issues for this high-risk pregnancy and has Cholelithiases; Hypertriglyceridemia; Calculus of gallbladder and bile duct without cholecystitis or obstruction; Supervision of high risk pregnancy, antepartum, second trimester; Gestational diabetes; Obesity in pregnancy; Language barrier affecting health care; and Oligohydramnios antepartum, third trimester, other fetus on their problem list.  Patient reports sl pain in her lower abd. .  Denies leaking of fluid.   The following portions of the patient's history were reviewed and updated as appropriate: allergies, current medications, past family history, past medical history, past social history, past surgical history and problem list. Problem list updated.  Objective:   Fetal Status: LMP 12/24/2017 (Approximate)    General:  Alert, oriented and cooperative. Patient is in no acute distress.  Skin: Skin is warm and dry. No rash noted.   Cardiovascular: Normal heart rate noted  Respiratory: Normal respiratory effort, no problems with respiration noted  Abdomen: Soft, gravid, appropriate for gestational age.        Pelvic: Cervical exam deferred        Extremities: Normal range of motion.     Mental Status: Normal mood and affect. Normal behavior. Normal judgment and thought content.   Assessment and Plan:  Pregnancy: [id number] at [redacted]w[redacted]d  1. Supervision of high risk pregnancy, antepartum  - GC/Chlamydia probe amp ([place])not at [facility]  2. Insulin controlled gestational diabetes mellitus (GDM) in third trimester Glc all WNL   3. Oligohydramnios, antepartum, single or unspecified fetus Improved  4. Supervision of high risk pregnancy, antepartum, second trimester  5. Oligohydramnios antepartum, third trimester, other fetus Resolved  6. Obesity in  pregnancy  Preterm labor symptoms and general obstetric precautions including but not limited to vaginal bleeding, contractions, leaking of fluid and fetal movement were reviewed in detail with the patient. Please refer to After Visit Summary for other counseling recommendations.  Return in about 1 week (around 10/22/2018) for [facility] and NST/BPP.  Future Appointments  Date Time Provider Department Center  10/15/2018  4:15 PM Willodean Rosenthal, MD [facility]    Willodean Rosenthal, MD

## 2018-10-15 NOTE — Progress Notes (Addendum)
Pt informed that the ultrasound is considered a limited OB ultrasound and is not intended to be a complete ultrasound exam.  Patient also informed that the ultrasound is not being completed with the intent of assessing for fetal or placental anomalies or any pelvic abnormalities.  Explained that the purpose of today's ultrasound is to assess for presentation, BPP and amniotic fluid volume.  Patient acknowledges the purpose of the exam and the limitations of the study.    Attestation of Attending Supervision of RN: Evaluation and management procedures were performed by the nurse under my supervision and collaboration.  I have reviewed the nursing note and chart, and I agree with the management and plan.  Carolyn L. Harraway-Smith, M.D., FACOG   

## 2018-10-15 NOTE — Progress Notes (Signed)
Interpreter Nile Riggs present for encounter. Pt was hospitalized last week due to oligohydramnios which resolved. She was d/c on 1/26. She received Betamethasone injections x2 during hospital stay. Pt reports decreased FM since discharge from hospital. She denies vaginal bleeding or having fluid leaking from vagina. Pt states she is taking her evening dose of insulin @ 8-9 pm

## 2018-10-19 ENCOUNTER — Other Ambulatory Visit: Payer: Self-pay | Admitting: Obstetrics & Gynecology

## 2018-10-19 DIAGNOSIS — O24414 Gestational diabetes mellitus in pregnancy, insulin controlled: Secondary | ICD-10-CM

## 2018-10-19 DIAGNOSIS — O4100X0 Oligohydramnios, unspecified trimester, not applicable or unspecified: Secondary | ICD-10-CM

## 2018-10-22 ENCOUNTER — Ambulatory Visit (INDEPENDENT_AMBULATORY_CARE_PROVIDER_SITE_OTHER): Payer: Self-pay | Admitting: Obstetrics & Gynecology

## 2018-10-22 ENCOUNTER — Other Ambulatory Visit: Payer: Self-pay

## 2018-10-22 ENCOUNTER — Ambulatory Visit (INDEPENDENT_AMBULATORY_CARE_PROVIDER_SITE_OTHER): Payer: Self-pay | Admitting: General Practice

## 2018-10-22 ENCOUNTER — Inpatient Hospital Stay (HOSPITAL_COMMUNITY)
Admission: AD | Admit: 2018-10-22 | Discharge: 2018-10-24 | DRG: 806 | Disposition: A | Discharge status: Home / Self Care | Payer: Medicaid Other | Attending: Obstetrics & Gynecology | Admitting: Obstetrics & Gynecology

## 2018-10-22 ENCOUNTER — Ambulatory Visit: Payer: Self-pay

## 2018-10-22 ENCOUNTER — Encounter (HOSPITAL_COMMUNITY): Payer: Self-pay | Admitting: Obstetrics

## 2018-10-22 ENCOUNTER — Encounter: Payer: Self-pay | Admitting: Obstetrics and Gynecology

## 2018-10-22 ENCOUNTER — Encounter: Payer: Self-pay | Admitting: Obstetrics & Gynecology

## 2018-10-22 VITALS — BP 121/78 | HR 90 | Wt 220.0 lb

## 2018-10-22 DIAGNOSIS — Z6833 Body mass index (BMI) 33.0-33.9, adult: Secondary | ICD-10-CM | POA: Diagnosis present

## 2018-10-22 DIAGNOSIS — O99213 Obesity complicating pregnancy, third trimester: Secondary | ICD-10-CM

## 2018-10-22 DIAGNOSIS — Z3A37 37 weeks gestation of pregnancy: Secondary | ICD-10-CM

## 2018-10-22 DIAGNOSIS — O24410 Gestational diabetes mellitus in pregnancy, diet controlled: Secondary | ICD-10-CM

## 2018-10-22 DIAGNOSIS — Z603 Acculturation difficulty: Secondary | ICD-10-CM | POA: Diagnosis present

## 2018-10-22 DIAGNOSIS — O99210 Obesity complicating pregnancy, unspecified trimester: Secondary | ICD-10-CM

## 2018-10-22 DIAGNOSIS — O24414 Gestational diabetes mellitus in pregnancy, insulin controlled: Secondary | ICD-10-CM

## 2018-10-22 DIAGNOSIS — O0992 Supervision of high risk pregnancy, unspecified, second trimester: Secondary | ICD-10-CM

## 2018-10-22 DIAGNOSIS — O4103X0 Oligohydramnios, third trimester, not applicable or unspecified: Secondary | ICD-10-CM | POA: Diagnosis present

## 2018-10-22 DIAGNOSIS — O99824 Streptococcus B carrier state complicating childbirth: Secondary | ICD-10-CM | POA: Diagnosis present

## 2018-10-22 DIAGNOSIS — Z789 Other specified health status: Secondary | ICD-10-CM

## 2018-10-22 DIAGNOSIS — O99214 Obesity complicating childbirth: Secondary | ICD-10-CM | POA: Diagnosis present

## 2018-10-22 DIAGNOSIS — O24424 Gestational diabetes mellitus in childbirth, insulin controlled: Principal | ICD-10-CM | POA: Diagnosis present

## 2018-10-22 DIAGNOSIS — Z3490 Encounter for supervision of normal pregnancy, unspecified, unspecified trimester: Secondary | ICD-10-CM | POA: Diagnosis present

## 2018-10-22 DIAGNOSIS — O289 Unspecified abnormal findings on antenatal screening of mother: Secondary | ICD-10-CM | POA: Diagnosis present

## 2018-10-22 DIAGNOSIS — O4103X9 Oligohydramnios, third trimester, other fetus: Secondary | ICD-10-CM | POA: Diagnosis present

## 2018-10-22 DIAGNOSIS — O0993 Supervision of high risk pregnancy, unspecified, third trimester: Secondary | ICD-10-CM

## 2018-10-22 DIAGNOSIS — E669 Obesity, unspecified: Secondary | ICD-10-CM | POA: Diagnosis present

## 2018-10-22 DIAGNOSIS — O368130 Decreased fetal movements, third trimester, not applicable or unspecified: Secondary | ICD-10-CM | POA: Diagnosis present

## 2018-10-22 DIAGNOSIS — Z8632 Personal history of gestational diabetes: Secondary | ICD-10-CM | POA: Diagnosis present

## 2018-10-22 LAB — CBC
HCT: 35.3 % — ABNORMAL LOW (ref 36.0–46.0)
HEMOGLOBIN: 12 g/dL (ref 12.0–15.0)
MCH: 30.5 pg (ref 26.0–34.0)
MCHC: 34 g/dL (ref 30.0–36.0)
MCV: 89.8 fL (ref 80.0–100.0)
Platelets: 323 10*3/uL (ref 150–400)
RBC: 3.93 MIL/uL (ref 3.87–5.11)
RDW: 12.9 % (ref 11.5–15.5)
WBC: 8.8 10*3/uL (ref 4.0–10.5)
nRBC: 0 % (ref 0.0–0.2)

## 2018-10-22 LAB — TYPE AND SCREEN
ABO/RH(D): O POS
Antibody Screen: NEGATIVE

## 2018-10-22 LAB — GLUCOSE, CAPILLARY
GLUCOSE-CAPILLARY: 115 mg/dL — AB (ref 70–99)
Glucose-Capillary: 92 mg/dL (ref 70–99)

## 2018-10-22 MED ORDER — OXYTOCIN BOLUS FROM INFUSION
500.0000 mL | Freq: Once | INTRAVENOUS | Status: AC
  Administered 2018-10-23: 500 mL via INTRAVENOUS

## 2018-10-22 MED ORDER — FLEET ENEMA 7-19 GM/118ML RE ENEM: 1.0000 | ENEMA | RECTAL | Status: DC | PRN

## 2018-10-22 MED ORDER — LIDOCAINE HCL (PF) 1 % IJ SOLN
30.0000 mL | INTRAMUSCULAR | Status: DC | PRN
  Filled 2018-10-22: qty 30

## 2018-10-22 MED ORDER — INSULIN NPH (HUMAN) (ISOPHANE) 100 UNIT/ML ~~LOC~~ SUSP
11.0000 [IU] | Freq: Two times a day (BID) | SUBCUTANEOUS | Status: DC
  Filled 2018-10-22: qty 10

## 2018-10-22 MED ORDER — OXYCODONE-ACETAMINOPHEN 5-325 MG PO TABS: 1.0000 | ORAL_TABLET | ORAL | Status: DC | PRN

## 2018-10-22 MED ORDER — SODIUM CHLORIDE 0.9 % IV SOLN
5.0000 10*6.[IU] | Freq: Once | INTRAVENOUS | Status: AC
  Administered 2018-10-22: 5 10*6.[IU] via INTRAVENOUS
  Filled 2018-10-22: qty 5

## 2018-10-22 MED ORDER — LACTATED RINGERS IV SOLN: 500.0000 mL | INTRAVENOUS | Status: DC | PRN

## 2018-10-22 MED ORDER — ACETAMINOPHEN 325 MG PO TABS: 650.0000 mg | ORAL_TABLET | ORAL | Status: DC | PRN

## 2018-10-22 MED ORDER — OXYTOCIN 40 UNITS IN NORMAL SALINE INFUSION - SIMPLE MED: 2.5000 [IU]/h | INTRAVENOUS | Status: DC

## 2018-10-22 MED ORDER — ONDANSETRON HCL 4 MG/2ML IJ SOLN: 4.0000 mg | Freq: Four times a day (QID) | INTRAMUSCULAR | Status: DC | PRN

## 2018-10-22 MED ORDER — OXYCODONE-ACETAMINOPHEN 5-325 MG PO TABS: 2.0000 | ORAL_TABLET | ORAL | Status: DC | PRN

## 2018-10-22 MED ORDER — PENICILLIN G 3 MILLION UNITS IVPB - SIMPLE MED
3.0000 10*6.[IU] | INTRAVENOUS | Status: DC
  Administered 2018-10-22 – 2018-10-23 (×3): 3 10*6.[IU] via INTRAVENOUS
  Filled 2018-10-22 (×4): qty 100

## 2018-10-22 MED ORDER — MISOPROSTOL 50MCG HALF TABLET
50.0000 ug | ORAL_TABLET | ORAL | Status: DC
  Administered 2018-10-22 (×2): 50 ug via BUCCAL
  Filled 2018-10-22 (×3): qty 1

## 2018-10-22 MED ORDER — INSULIN NPH (HUMAN) (ISOPHANE) 100 UNIT/ML ~~LOC~~ SUSP
11.0000 [IU] | Freq: Two times a day (BID) | SUBCUTANEOUS | Status: AC
  Administered 2018-10-22: 11 [IU] via SUBCUTANEOUS

## 2018-10-22 MED ORDER — SOD CITRATE-CITRIC ACID 500-334 MG/5ML PO SOLN: 30.0000 mL | ORAL | Status: DC | PRN

## 2018-10-22 MED ORDER — LACTATED RINGERS IV SOLN
INTRAVENOUS | Status: DC
  Administered 2018-10-22 – 2018-10-23 (×2): via INTRAVENOUS

## 2018-10-22 NOTE — Progress Notes (Addendum)
Pt informed that the ultrasound is considered a limited OB ultrasound and is not intended to be a complete ultrasound exam.  Patient also informed that the ultrasound is not being completed with the intent of assessing for fetal or placental anomalies or any pelvic abnormalities.  Explained that the purpose of today's ultrasound is to assess for  BPP, presentation and AFI.  Patient acknowledges the purpose of the exam and the limitations of the study.    Chase Caller RN BSN 10/22/18  Attestation of Attending Supervision of RN: Evaluation and management procedures were performed by the nurse under my supervision and collaboration.  I have reviewed the nursing note and chart, and I agree with the management and plan.  Carolyn L. Harraway-Smith, M.D., [place] [place]

## 2018-10-22 NOTE — H&P (Addendum)
LABOR AND DELIVERY ADMISSION HISTORY AND PHYSICAL NOTE  Shawna [name] is a 33 y.o. female [phone] with IUP at 34w2dby 12-wk Ultrasound presenting for IOL for A2GDM controlled with 22 units NPH BID and oligohydramnios.   She reports positive fetal movement. She denies leakage of fluid or vaginal bleeding.  [facility]:  - Oligohydramnios - A2GDM - well controlled (CBGs between 80-140)  Past Medical History: Past Medical History:  Diagnosis Date  . Diabetes mellitus without complication ([place]   . Gallstone   . Gestational diabetes    Past Surgical History: Past Surgical History:  Procedure Laterality Date  . NO PAST SURGERIES     Obstetrical History: OB History    Gravida  7   Para  3   Term  3   Preterm  0   AB  3   Living  3     SAB  3   TAB  0   Ectopic  0   Multiple  0   Live Births  3          Social History: Social History   Socioeconomic History  . Marital status: Single    Spouse name: Not on file  . Number of children: Not on file  . Years of education: Not on file  . Highest education level: Not on file  Occupational History  . Not on file  Social Needs  . Financial resource strain: Not on file  . Food insecurity:    Worry: Never true    Inability: Sometimes true  . Transportation needs:    Medical: No    Non-medical: No  Tobacco Use  . Smoking status: Never Smoker  . Smokeless tobacco: Never Used  Substance and Sexual Activity  . Alcohol use: No  . Drug use: No  . Sexual activity: Yes    Birth control/protection: None  Lifestyle  . Physical activity:    Days per week: Not on file    Minutes per session: Not on file  . Stress: Not on file  Relationships  . Social connections:    Talks on phone: Not on file    Gets together: Not on file    Attends religious service: Not on file    Active member of club or organization: Not on file    Attends meetings of clubs  or organizations: Not on file    Relationship status: Not on file  Other Topics Concern  . Not on file  Social History Narrative  . Not on file    Family History: Family History  Problem Relation Age of Onset  . Diabetes Mother   . Cancer Mother   . Heart disease Father   . Hypertension Father     Allergies: No Known Allergies  Medications Prior to Admission  Medication Sig Dispense Refill Last Dose  . ACCU-CHEK FASTCLIX LANCETS MISC 1 each by Percutaneous route 4 (four) times daily. 100 each 12 Taking  . aspirin EC 81 MG tablet Take 1 tablet (81 mg total) by mouth daily. Take after 12 weeks for prevention of preeclampsia later in pregnancy 300 tablet 2 Taking  . Blood Glucose Monitoring Suppl (ACCU-CHEK GUIDE) w/Device KIT 1 each by Does not apply route 4 (four) times daily. 1 kit 0 Taking  . Elastic Bandages & Supports (COMFORT FIT MATERNITY SUPP MED) MISC Wear daily when ambulating (Patient not taking: Reported on 10/15/2018) 1 each 0 Not Taking  . glucose blood (ACCU-CHEK  GUIDE) test strip Use as instructed 50 each 12 Taking  . insulin NPH Human (HUMULIN N,NOVOLIN N) 100 UNIT/ML injection Inject 0.22 mLs (22 Units total) into the skin 2 (two) times daily before a meal. (Patient not taking: Reported on 10/15/2018) 10 mL 3 Not Taking  . Insulin Syringe-Needle U-100 (INSULIN SYRINGE 1CC/31GX5/16") 31G X 5/16" 1 ML MISC 1 each by Does not apply route 3 (three) times daily. 90 each 1 Taking  . Prenatal Vit-Fe Fumarate-FA (PRENATAL MULTIVITAMIN) TABS tablet Take 1 tablet by mouth daily at 12 noon.   Taking     Review of Systems  All systems reviewed and negative except as stated in HPI  Physical Exam Blood pressure 129/72, pulse 87, temperature 97.7 F (36.5 C), temperature source Oral, resp. rate 18, height 5' 5.5" (1.664 m), weight 100 kg, last menstrual period 12/24/2017, unknown if currently breastfeeding. General appearance: alert, oriented, NAD Lungs: normal respiratory  effort Heart: regular rate Abdomen: soft, non-tender; gravid, FH appropriate for GA Extremities: No calf swelling or tenderness Presentation: cephalic Fetal monitoring: 130 baseline, moderate variability, 15x15 accels, no decels Uterine activity: one contraction q10 minutes Dilation: 1.5 Effacement (%): 40 Station: -3 Exam by:: kooistra cnm  Prenatal labs: ABO, Rh: --/--/O POS (01/23 2124) Antibody: NEG (01/23 2124) Rubella: Immune (08/01 0000) RPR: Non Reactive (11/27 1348)  HBsAg: Negative (08/01 0000)  HIV: Non Reactive (11/27 1348)  GC/Chlamydia: Negative/Negative (07/03/2018) GBS: Positive (01/23 0000)  1-hr GTT: 139, HbA1c 6.4% Genetic screening:  wnl/negative Anatomy US: WNL, female  Prenatal Transfer Tool  Maternal Diabetes: Yes:  Diabetes Type:  Insulin/Medication controlled Genetic Screening: Normal Maternal Ultrasounds/Referrals: Normal Fetal Ultrasounds or other Referrals:  None Maternal Substance Abuse:  No Significant Maternal Medications:  Meds include: Other: Prenatal vitamin and Insulin NPH 22 units BID Significant Maternal Lab Results: Lab values include: Group B Strep positive  Results for orders placed or performed during the hospital encounter of 10/22/18 (from the past 24 hour(s))  Glucose, capillary   Collection Time: 10/22/18  5:26 PM  Result Value Ref Range   Glucose-Capillary 92 70 - 99 mg/dL    Patient Active Problem List   Diagnosis Date Noted  . Indication for care in labor or delivery 10/22/2018  . Oligohydramnios antepartum, third trimester, other fetus 10/08/2018  . Obesity in pregnancy 09/21/2018  . Language barrier affecting health care 09/21/2018  . Supervision of high risk pregnancy, antepartum, second trimester 05/06/2018  . Gestational diabetes 05/06/2018  . Calculus of gallbladder and bile duct without cholecystitis or obstruction 08/24/2015  . Hypertriglyceridemia 07/12/2014  . Cholelithiases 01/03/2014    Assessment: Shawna [name] is a 33 y.o. [id number] at 80w2dhere for A2GDM and Oligohydramnios.  #Labor: Latent, FB placed, buccal cytotec started #Pain: Plan for epidural #FWB: Category 1 #ID:  GBS Positive #MOF: Breast #MOC:Nexplanon #Circ:  None desired  HDaisy Floro2/02/2019, 6:03 PM   I confirm that I have verified the information documented in the resident's note and that I have also personally reperformed the physical exam and all medical decision making activities.   KMaye HidesCNM

## 2018-10-22 NOTE — Progress Notes (Signed)
   PRENATAL VISIT NOTE  Subjective:  Shawna [name] is a 33 y.o. [phone] at [redacted]w[redacted]d being seen today for ongoing prenatal care.  She is currently monitored for the following issues for this high-risk pregnancy and has Cholelithiases; Hypertriglyceridemia; Calculus of gallbladder and bile duct without cholecystitis or obstruction; Supervision of high risk pregnancy, antepartum, second trimester; Gestational diabetes; Obesity in pregnancy; Language barrier affecting health care; and Oligohydramnios antepartum, third trimester, other fetus on their problem list.  Patient reports decreased fetal movement.  Contractions: Irritability. Vag. Bleeding: None.  Movement: Present. Denies leaking of fluid.   The following portions of the patient's history were reviewed and updated as appropriate: allergies, current medications, past family history, past medical history, past social history, past surgical history and problem list. Problem list updated.  Objective:   Vitals:   10/22/18 1529  BP: 121/78  Pulse: 90  Weight: 220 lb (99.8 kg)    Fetal Status: Fetal Heart Rate (bpm): NST   Movement: Present     General:  Alert, oriented and cooperative. Patient is in no acute distress.  Skin: Skin is warm and dry. No rash noted.   Cardiovascular: Normal heart rate noted  Respiratory: Normal respiratory effort, no problems with respiration noted  Abdomen: Soft, gravid, appropriate for gestational age.  Pain/Pressure: Present     Pelvic: Cervical exam deferred        Extremities: Normal range of motion.  Edema: None  Mental Status: Normal mood and affect. Normal behavior. Normal judgment and thought content.   Assessment and Plan:  Pregnancy: [id number] at [redacted]w[redacted]d  1. Supervision of high risk pregnancy, antepartum, second trimester Decreased fetal movement BPP 6/10 AFI<5  To L&D for IOL  2. Obesity in pregnancy  3. Language barrier affecting health care Spanish speaking only  4. Diet controlled  gestational diabetes mellitus (GDM) in third trimester rec delivery at 32 week sgestation  Term labor symptoms and general obstetric precautions including but not limited to vaginal bleeding, contractions, leaking of fluid and fetal movement were reviewed in detail with the patient. Please refer to After Visit Summary for other counseling recommendations.  Return in about 1 week (around 10/29/2018).  Future Appointments  Date Time Provider Department Center  10/22/2018  4:35 PM [facility]OC  10/29/2018  2:15 PM [facility] NST [facility]OC-[facility] WOC  10/29/2018  4:15 PM Adam Phenix, MD [facility]    Willodean Rosenthal, MD

## 2018-10-22 NOTE — Progress Notes (Signed)
LABOR PROGRESS NOTE  LISANNE COBB is a 33 y.o. [phone] at [redacted]w[redacted]d  admitted for IOL for GDMA2 (2u NPH BID) and oligohydramnios.  Subjective: Pt is doing well. Endorses fetal movement. Denies leakage. Endorses vaginal bleeding about the same as a period.  Objective: BP 119/80   Pulse 74   Temp 98.5 F (36.9 C) (Oral)   Resp 18   Ht 5' 5.5" (1.664 m)   Wt 100 kg   LMP 12/24/2017 (Approximate)   BMI 36.12 kg/m  or  Vitals:   10/22/18 1700 10/22/18 1709 10/22/18 1808 10/22/18 1909  BP: 129/72  119/68 119/80  Pulse: 87  80 74  Resp: 18   18  Temp: 97.7 F (36.5 C)   98.5 F (36.9 C)  TempSrc: Oral   Oral  Weight:  100 kg    Height:  5' 5.5" (1.664 m)      Dilation: 1.5 Effacement (%): 40 Station: -3 Presentation: Vertex Exam by:: kooistra cnm FHT: baseline rate 135bpm, moderate varibility, 15x15 acel, no decels Toco: occasional   Labs: Lab Results  Component Value Date   WBC 8.8 10/22/2018   HGB 12.0 10/22/2018   HCT 35.3 (L) 10/22/2018   MCV 89.8 10/22/2018   PLT 323 10/22/2018    Patient Active Problem List   Diagnosis Date Noted  . Indication for care in labor or delivery 10/22/2018  . Oligohydramnios antepartum, third trimester, other fetus 10/08/2018  . Obesity in pregnancy 09/21/2018  . Language barrier affecting health care 09/21/2018  . Supervision of high risk pregnancy, antepartum, second trimester 05/06/2018  . Gestational diabetes 05/06/2018  . Calculus of gallbladder and bile duct without cholecystitis or obstruction 08/24/2015  . Hypertriglyceridemia 07/12/2014  . Cholelithiases 01/03/2014    Assessment / Plan: 33 y.o. [id number] at [redacted]w[redacted]d here for IOL for GDMA2 and oligohydramnios.  Labor: Latent, FB in place, last cytotec dose at 1800 - next due at 2200 Fetal Wellbeing:  Cat 1 Pain Control:  Plans for epidural Anticipated MOD:  SVD  Xan Joselynn Amoroso, D.O. 10/22/2018, 9:04 PM

## 2018-10-22 NOTE — Anesthesia Pain Management Evaluation Note (Signed)
  CRNA Pain Management Visit Note  Patient: Shawna [name], 33 y.o., female  "Hello I am a member of the anesthesia team at [facility]. We have an anesthesia team available at all times to provide care throughout the hospital, including epidural management and anesthesia for C-section. I don't know your plan for the delivery whether it a natural birth, water birth, IV sedation, nitrous supplementation, doula or epidural, but we want to meet your pain goals."   1.Was your pain managed to your expectations on prior hospitalizations?   Yes   2.What is your expectation for pain management during this hospitalization?     Labor support without medications, Epidural and IV pain meds  3.How can we help you reach that goal? Possible epidural  Record the patient's initial score and the patient's pain goal.   Pain: 3  Pain Goal: 8 [facility] wants you to be able to say your pain was always managed very well.  Fay Bagg 10/22/2018

## 2018-10-23 ENCOUNTER — Encounter (HOSPITAL_COMMUNITY): Payer: Self-pay | Admitting: *Deleted

## 2018-10-23 ENCOUNTER — Inpatient Hospital Stay (HOSPITAL_COMMUNITY): Payer: Medicaid Other | Admitting: Anesthesiology

## 2018-10-23 ENCOUNTER — Other Ambulatory Visit: Payer: Self-pay

## 2018-10-23 DIAGNOSIS — O289 Unspecified abnormal findings on antenatal screening of mother: Secondary | ICD-10-CM | POA: Diagnosis present

## 2018-10-23 DIAGNOSIS — Z3A37 37 weeks gestation of pregnancy: Secondary | ICD-10-CM

## 2018-10-23 DIAGNOSIS — O24424 Gestational diabetes mellitus in childbirth, insulin controlled: Secondary | ICD-10-CM

## 2018-10-23 DIAGNOSIS — O99824 Streptococcus B carrier state complicating childbirth: Secondary | ICD-10-CM

## 2018-10-23 DIAGNOSIS — O4103X0 Oligohydramnios, third trimester, not applicable or unspecified: Secondary | ICD-10-CM

## 2018-10-23 LAB — RPR: RPR: NONREACTIVE

## 2018-10-23 LAB — GLUCOSE, CAPILLARY
Glucose-Capillary: 77 mg/dL (ref 70–99)
Glucose-Capillary: 82 mg/dL (ref 70–99)
Glucose-Capillary: 84 mg/dL (ref 70–99)
Glucose-Capillary: 81 mg/dL (ref 70–99)

## 2018-10-23 MED ORDER — PRENATAL MULTIVITAMIN CH
1.0000 | ORAL_TABLET | Freq: Every day | ORAL | Status: DC
  Administered 2018-10-23 – 2018-10-24 (×2): 1 via ORAL
  Filled 2018-10-23 (×2): qty 1

## 2018-10-23 MED ORDER — COCONUT OIL OIL: 1.0000 "application " | TOPICAL_OIL | Status: DC | PRN

## 2018-10-23 MED ORDER — SENNOSIDES-DOCUSATE SODIUM 8.6-50 MG PO TABS
2.0000 | ORAL_TABLET | ORAL | Status: DC
  Administered 2018-10-24: 2 via ORAL
  Filled 2018-10-23: qty 2

## 2018-10-23 MED ORDER — WITCH HAZEL-GLYCERIN EX PADS: 1.0000 "application " | MEDICATED_PAD | CUTANEOUS | Status: DC | PRN

## 2018-10-23 MED ORDER — TERBUTALINE SULFATE 1 MG/ML IJ SOLN
0.2500 mg | Freq: Once | INTRAMUSCULAR | Status: DC | PRN
  Filled 2018-10-23: qty 1

## 2018-10-23 MED ORDER — DIBUCAINE 1 % RE OINT: 1.0000 "application " | TOPICAL_OINTMENT | RECTAL | Status: DC | PRN

## 2018-10-23 MED ORDER — ACETAMINOPHEN 325 MG PO TABS
650.0000 mg | ORAL_TABLET | ORAL | Status: DC | PRN
  Administered 2018-10-23 – 2018-10-24 (×3): 650 mg via ORAL
  Filled 2018-10-23 (×3): qty 2

## 2018-10-23 MED ORDER — EPHEDRINE 5 MG/ML INJ
10.0000 mg | INTRAVENOUS | Status: DC | PRN
  Filled 2018-10-23: qty 2

## 2018-10-23 MED ORDER — LIDOCAINE HCL (PF) 1 % IJ SOLN
INTRAMUSCULAR | Status: DC | PRN
  Administered 2018-10-23 (×2): 4 mL via EPIDURAL

## 2018-10-23 MED ORDER — LACTATED RINGERS IV SOLN: 500.0000 mL | Freq: Once | INTRAVENOUS | Status: DC

## 2018-10-23 MED ORDER — OXYTOCIN 40 UNITS IN NORMAL SALINE INFUSION - SIMPLE MED
1.0000 m[IU]/min | INTRAVENOUS | Status: DC
  Administered 2018-10-23: 2 m[IU]/min via INTRAVENOUS
  Filled 2018-10-23: qty 1000

## 2018-10-23 MED ORDER — PHENYLEPHRINE 40 MCG/ML (10ML) SYRINGE FOR IV PUSH (FOR BLOOD PRESSURE SUPPORT)
80.0000 ug | PREFILLED_SYRINGE | INTRAVENOUS | Status: DC | PRN
  Filled 2018-10-23: qty 10

## 2018-10-23 MED ORDER — ONDANSETRON HCL 4 MG/2ML IJ SOLN: 4.0000 mg | INTRAMUSCULAR | Status: DC | PRN

## 2018-10-23 MED ORDER — PHENYLEPHRINE 40 MCG/ML (10ML) SYRINGE FOR IV PUSH (FOR BLOOD PRESSURE SUPPORT)
80.0000 ug | PREFILLED_SYRINGE | INTRAVENOUS | Status: DC | PRN
  Filled 2018-10-23 (×2): qty 10

## 2018-10-23 MED ORDER — ONDANSETRON HCL 4 MG PO TABS: 4.0000 mg | ORAL_TABLET | ORAL | Status: DC | PRN

## 2018-10-23 MED ORDER — ZOLPIDEM TARTRATE 5 MG PO TABS: 5.0000 mg | ORAL_TABLET | Freq: Every evening | ORAL | Status: DC | PRN

## 2018-10-23 MED ORDER — TETANUS-DIPHTH-ACELL PERTUSSIS 5-2.5-18.5 LF-MCG/0.5 IM SUSP: 0.5000 mL | Freq: Once | INTRAMUSCULAR | Status: DC

## 2018-10-23 MED ORDER — FENTANYL 2.5 MCG/ML BUPIVACAINE 1/10 % EPIDURAL INFUSION (WH - ANES)
14.0000 mL/h | INTRAMUSCULAR | Status: DC | PRN
  Administered 2018-10-23: 14 mL/h via EPIDURAL
  Filled 2018-10-23: qty 100

## 2018-10-23 MED ORDER — IBUPROFEN 600 MG PO TABS
600.0000 mg | ORAL_TABLET | Freq: Four times a day (QID) | ORAL | Status: DC
  Administered 2018-10-23 – 2018-10-24 (×5): 600 mg via ORAL
  Filled 2018-10-23 (×5): qty 1

## 2018-10-23 MED ORDER — DIPHENHYDRAMINE HCL 25 MG PO CAPS: 25.0000 mg | ORAL_CAPSULE | Freq: Four times a day (QID) | ORAL | Status: DC | PRN

## 2018-10-23 MED ORDER — DIPHENHYDRAMINE HCL 50 MG/ML IJ SOLN: 12.5000 mg | INTRAMUSCULAR | Status: DC | PRN

## 2018-10-23 MED ORDER — BENZOCAINE-MENTHOL 20-0.5 % EX AERO: 1.0000 "application " | INHALATION_SPRAY | CUTANEOUS | Status: DC | PRN

## 2018-10-23 MED ORDER — SIMETHICONE 80 MG PO CHEW: 80.0000 mg | CHEWABLE_TABLET | ORAL | Status: DC | PRN

## 2018-10-23 NOTE — Progress Notes (Signed)
[employer] utilized. [id number]. [place].

## 2018-10-23 NOTE — Progress Notes (Signed)
Spanish interpreter, Debarah Crape [phone] used during shift assessment to explain plan of care and answer patient and FOB questions.

## 2018-10-23 NOTE — Progress Notes (Signed)
LABOR PROGRESS NOTE  Shawna [name] is a 33 y.o. [phone] at [redacted]w[redacted]d  admitted for IOL for GDMA2 (2u NPH BID) and oligohydramnios.  Subjective: Patient is doing well and resting comfortably. Endorses pain in feet with ambulation. Feeling contractions. Pain well controlled.  Objective: BP 118/74   Pulse 66   Temp 98.1 F (36.7 C) (Oral)   Resp 16   Ht 5' 5.5" (1.664 m)   Wt 100 kg   LMP 12/24/2017 (Approximate)   BMI 36.12 kg/m  or  Vitals:   10/22/18 2302 10/23/18 0007 10/23/18 0128 10/23/18 0207  BP: (!) 131/93 121/79 118/72 118/74  Pulse: 68 66 67 66  Resp: 15 16 15 16   Temp:  98.1 F (36.7 C)    TempSrc:  Oral    Weight:      Height:         Dilation: 5.5 Effacement (%): 80 Station: -2 Presentation: Vertex Exam by:: Jeanett Schlein, RN FHT: baseline rate 135, moderate varibility, 15x15 acel, no decel Toco: occasional   Labs: Lab Results  Component Value Date   WBC 8.8 10/22/2018   HGB 12.0 10/22/2018   HCT 35.3 (L) 10/22/2018   MCV 89.8 10/22/2018   PLT 323 10/22/2018    Patient Active Problem List   Diagnosis Date Noted  . Indication for care in labor or delivery 10/22/2018  . Oligohydramnios antepartum, third trimester, other fetus 10/08/2018  . Obesity in pregnancy 09/21/2018  . Language barrier affecting health care 09/21/2018  . Supervision of high risk pregnancy, antepartum, second trimester 05/06/2018  . Gestational diabetes 05/06/2018  . Calculus of gallbladder and bile duct without cholecystitis or obstruction 08/24/2015  . Hypertriglyceridemia 07/12/2014  . Cholelithiases 01/03/2014    Assessment / Plan: 33 y.o. [id number] at [redacted]w[redacted]d here for IOL for GDMA2 (2u NPH BID) and oligohydramnios  Labor: Latent, 0200 cytotec dose not given, will start pitocin Fetal Wellbeing:  Cat 1 Pain Control:  Plans for epidural Anticipated MOD:  SVD  [name], D.O. 10/23/2018, 2:17 AM

## 2018-10-23 NOTE — Anesthesia Procedure Notes (Signed)
Epidural Patient location during procedure: OB Start time: 10/23/2018 3:18 AM End time: 10/23/2018 3:20 AM  Staffing Anesthesiologist: Kaylyn Layer, MD Performed: anesthesiologist   Preanesthetic Checklist Completed: patient identified, pre-op evaluation, timeout performed, IV checked, risks and benefits discussed and monitors and equipment checked  Epidural Patient position: sitting Prep: site prepped and draped and DuraPrep Patient monitoring: continuous pulse ox, blood pressure, heart rate and cardiac monitor Approach: midline Location: L3-L4 Injection technique: LOR air  Needle:  Needle type: Tuohy  Needle gauge: 17 G Needle length: 9 cm Needle insertion depth: 8 cm Catheter type: closed end flexible Catheter size: 19 Gauge Catheter at skin depth: 13 cm Test dose: negative and Other (1% lidocaine)  Assessment Events: blood not aspirated, injection not painful, no injection resistance, negative IV test and no paresthesia  Additional Notes Patient identified. Risks, benefits, and alternatives discussed with patient including but not limited to bleeding, infection, nerve damage, paralysis, failed block, incomplete pain control, headache, blood pressure changes, nausea, vomiting, reactions to medication, itching, and postpartum back pain. Confirmed with bedside nurse the patient's most recent platelet count. Confirmed with patient that they are not currently taking any anticoagulation, have any bleeding history, or any family history of bleeding disorders. Patient expressed understanding and wished to proceed. All questions were answered. Sterile technique was used throughout the entire procedure. Crisp LOR on first pass. Please see nursing notes for vital signs. Test dose was given through epidural catheter and negative prior to continuing to dose epidural or start infusion. Warning signs of high block given to the patient including shortness of breath, tingling/numbness in hands,  complete motor block, or any concerning symptoms with instructions to call for help. Patient was given instructions on fall risk and not to get out of bed. All questions and concerns addressed with instructions to call with any issues or inadequate analgesia.  Reason for block:procedure for pain

## 2018-10-23 NOTE — Anesthesia Postprocedure Evaluation (Signed)
Anesthesia Post Note  Patient: Shawna [name]  Procedure(s) Performed: AN AD HOC LABOR EPIDURAL     Patient location during evaluation: Mother Baby Anesthesia Type: Epidural Level of consciousness: awake and alert and oriented Pain management: satisfactory to patient Vital Signs Assessment: post-procedure vital signs reviewed and stable Respiratory status: respiratory function stable Cardiovascular status: stable Postop Assessment: no headache, no backache, epidural receding, patient able to bend at knees, no signs of nausea or vomiting and adequate PO intake Anesthetic complications: no    Last Vitals:  Vitals:   10/23/18 1110 10/23/18 1230  BP: 116/71 120/74  Pulse: 76 73  Resp: 18 18  Temp: 37.1 C 36.8 C  SpO2:      Last Pain:  Vitals:   10/23/18 1230  TempSrc: Oral  PainSc: 0-No pain   Pain Goal:                   Omar Gayden

## 2018-10-23 NOTE — Progress Notes (Signed)
LABOR PROGRESS NOTE  Shawna [name] is a 33 y.o. [phone] at [redacted]w[redacted]d  admitted for IOL for GDMA2 (2u NPH BID) and oligohydramnios  Subjective: Patient is comfortable and resting well. She endorses feeling contractions. Denies pain. No complaints or concerns.  Objective: BP 116/76   Pulse 72   Temp 97.9 F (36.6 C) (Oral)   Resp 15   Ht 5' 5.5" (1.664 m)   Wt 100 kg   LMP 12/24/2017 (Approximate)   SpO2 99%   BMI 36.12 kg/m  or  Vitals:   10/23/18 0351 10/23/18 0426 10/23/18 0431 10/23/18 0432  BP: 118/73   116/76  Pulse: 67   72  Resp: 16   15  Temp:      TempSrc:      SpO2: 100% 100% 99%   Weight:      Height:         Dilation: 6 Effacement (%): 80 Station: -2 Presentation: Vertex Exam by:: Shawna Schlein, RN FHT: baseline rate 120, moderate varibility, 10x10 acel, no decel Toco: Q2-98min  Labs: Lab Results  Component Value Date   WBC 8.8 10/22/2018   HGB 12.0 10/22/2018   HCT 35.3 (L) 10/22/2018   MCV 89.8 10/22/2018   PLT 323 10/22/2018    Patient Active Problem List   Diagnosis Date Noted  . Indication for care in labor or delivery 10/22/2018  . Oligohydramnios antepartum, third trimester, other fetus 10/08/2018  . Obesity in pregnancy 09/21/2018  . Language barrier affecting health care 09/21/2018  . Supervision of high risk pregnancy, antepartum, second trimester 05/06/2018  . Gestational diabetes 05/06/2018  . Calculus of gallbladder and bile duct without cholecystitis or obstruction 08/24/2015  . Hypertriglyceridemia 07/12/2014  . Cholelithiases 01/03/2014    Assessment / Plan: 33 y.o. [id number] at [redacted]w[redacted]d here for IOL for GDMA2 (2u NPH BID) and oligohydramnios.  Labor: Latent, pitocin started at 0410, penicillin running Fetal Wellbeing:  Cat 1 Pain Control:  Epidural Anticipated MOD:  SVD  Shawna [name], D.O. 10/23/2018, 4:43 AM

## 2018-10-23 NOTE — Progress Notes (Signed)
Completed admission using the [facility] video interpreter, Spanish interpreter Rayburn Ma [phone].

## 2018-10-23 NOTE — Progress Notes (Signed)
OB/GYN Faculty Practice: Labor Progress Note  Subjective: Strip note. Plan of care discussed with RN. Was feeling more pressure recently, changed to 7cm with BBOW  Objective: BP 123/70   Pulse 65   Temp 97.9 F (36.6 C) (Oral)   Resp 16   Ht 5' 5.5" (1.664 m)   Wt 100 kg   LMP 12/24/2017 (Approximate)   SpO2 99%   BMI 36.12 kg/m  Gen: strip note Dilation: 7 Effacement (%): 80 Station: -2 Presentation: Vertex Exam by:: Roxan Diesel, RN  Assessment and Plan: 33 y.o. [phone] [redacted]w[redacted]d here for IOL for oligohydramnios, decreased fetal movement, in the setting of A2GDM.   Labor: Induction started with FB and cytotec. Transitioned to pitocin around 0230. Defer AROM to next check.  -- pain control: epidural now in place -- PPH Risk: medium  Fetal Well-Being: EFW 2271g, 29% at 35w2. Cephalic by sutures.  -- Category I - continuous fetal monitoring  -- GBS positive   A2GDM: NPH 11U BID, last dose overnight. BG stable - last 84, 82, 77. -- CBG q2hrs now that in active labor   Reita Shindler S. Earlene Plater, DO OB/GYN Fellow, Faculty Practice  7:38 AM

## 2018-10-23 NOTE — Discharge Summary (Addendum)
Postpartum Discharge Summary    Patient Name: Shawna [name] DOB: 1985-11-09 MRN: [id number]  Date of admission: 10/22/2018 Delivering Provider: Aura Camps   Date of discharge: 10/24/2018  Admitting diagnosis: induction, oligo, DFM Intrauterine pregnancy: [redacted]w[redacted]d    Secondary diagnosis:  Principal Problem:   Encounter for induction of labor Active Problems:   Gestational diabetes   Obesity in pregnancy   Language barrier affecting health care   Oligohydramnios antepartum, third trimester, other fetus   Abnormal antenatal test  Additional problems: none     Discharge diagnosis: Term Pregnancy Delivered and GDM A2               Post partum procedures:none Augmentation: AROM, Pitocin, Cytotec and Foley Balloon Complications: None  Hospital course:  Induction of Labor With Vaginal Delivery   33y.o. yo [id number] [id number] admitted to the hospital 10/22/2018 for induction of labor.  Indication for induction: oligohydramnios and decreased fetal movement.  Patient had an uncomplicated labor course as follows: Membrane Rupture Time/Date: 8:55 AM ,10/23/2018   Intrapartum Procedures: Episiotomy: None [1]                                         Lacerations:  None [1]  Patient had delivery of a Viable infant.  Information for the patient's newborn:  CoHuston FoleyuWeslynn0[[id number]]Delivery Method: Vag-Spont   10/23/2018  Details of delivery can be found in separate delivery note.  Patient had a routine postpartum course. Patient is discharged home 10/24/18.  Magnesium Sulfate recieved: No BMZ received: Yes  Physical exam  Vitals:   10/23/18 1633 10/23/18 2015 10/24/18 0038 10/24/18 0556  BP: 108/68 112/68 112/68 123/82  Pulse: 64 70 (!) 58 64  Resp: 20 18 18 16   Temp: 97.7 F (36.5 C) (!) 97.5 F (36.4 C) 97.7 F (36.5 C) 97.6 F (36.4 C)  TempSrc: Oral Oral Oral Oral  SpO2:      Weight:      Height:       General: alert, cooperative and no distress Lochia:  appropriate Uterine Fundus: firm Incision: N/A DVT Evaluation: No evidence of DVT seen on physical exam. Labs: Lab Results  Component Value Date   WBC 10.3 10/24/2018   HGB 11.1 (L) 10/24/2018   HCT 33.3 (L) 10/24/2018   MCV 91.7 10/24/2018   PLT 263 10/24/2018   CMP Latest Ref Rng & Units 10/08/2018  Glucose 70 - 99 mg/dL 113(H)  BUN 6 - 20 mg/dL 17  Creatinine 0.44 - 1.00 mg/dL 0.50  Sodium 135 - 145 mmol/L 138  Potassium 3.5 - 5.1 mmol/L 4.9  Chloride 98 - 111 mmol/L 105  CO2 22 - 32 mmol/L 23  Calcium 8.9 - 10.3 mg/dL 9.0  Total Protein 6.5 - 8.1 g/dL 6.3(L)  Total Bilirubin 0.3 - 1.2 mg/dL 1.0  Alkaline Phos 38 - 126 U/L 137(H)  AST 15 - 41 U/L 32  ALT 0 - 44 U/L 18    Discharge instruction: per After Visit Summary and "Baby and Me Booklet".  After visit meds:  Allergies as of 10/24/2018   No Known Allergies     Medication List    STOP taking these medications   ACCU-CHEK FASTCLIX LANCETS Misc   ACCU-CHEK GUIDE w/Device Kit   COMFORT FIT MATERNITY SUPP MED Misc   glucose blood test strip Commonly known as:  ACCU-CHEK  GUIDE   INSULIN SYRINGE 1CC/31GX5/16" 31G X 5/16" 1 ML Misc     TAKE these medications   acetaminophen 325 MG tablet Commonly known as:  TYLENOL Take 2 tablets (650 mg total) by mouth every 4 (four) hours as needed (for pain scale < 4).   ibuprofen 600 MG tablet Commonly known as:  ADVIL,MOTRIN Take 1 tablet (600 mg total) by mouth every 6 (six) hours.   prenatal multivitamin Tabs tablet Take 1 tablet by mouth daily at 12 noon.   senna-docusate 8.6-50 MG tablet Commonly known as:  Senokot-S Take 2 tablets by mouth daily. Start taking on:  October 25, 2018       Diet: routine diet  Activity: Advance as tolerated. Pelvic rest for 6 weeks.   Outpatient follow up:6 weeks Follow up Appt: Future Appointments  Date Time Provider [place]  11/20/2018  9:30 AM [facility] LAB [facility] WOC  11/20/2018 10:15 AM Manya Silvas, CNM  [facility] WOC   Follow up Visit: Please schedule this patient for Postpartum visit in: 6 weeks with the following provider: Any provider For C/S patients schedule nurse incision check in weeks 2 weeks: no Low risk pregnancy complicated by: GDM Delivery mode:  SVD Anticipated Birth Control:  Nexplanon PP Procedures needed: 2 hour GTT  Schedule Integrated BH visit: no  Newborn Data: Live born female  Birth Weight:   APGAR: 43, 9  Newborn Delivery   Birth date/time:  10/23/2018 09:11:00 Delivery type:  Vaginal, Spontaneous    Baby Feeding: both Disposition:home with mother  Susette Racer, DO [facility] Resident PGY1  Attestation: I have seen this patient and agree with the resident's documentation. I have examined them separately, and we have discussed the plan of care.  Lambert Mody. Juleen China, DO OB/GYN Fellow

## 2018-10-23 NOTE — Anesthesia Preprocedure Evaluation (Signed)
Anesthesia Evaluation  Patient identified by MRN, date of birth, ID band Patient awake    Reviewed: Allergy & Precautions, Patient's Chart, lab work & pertinent test results  History of Anesthesia Complications Negative for: history of anesthetic complications  Airway Mallampati: II  TM Distance: >3 FB Neck ROM: Full    Dental  (+) Teeth Intact   Pulmonary neg pulmonary ROS,    Pulmonary exam normal breath sounds clear to auscultation       Cardiovascular negative cardio ROS Normal cardiovascular exam Rhythm:Regular Rate:Normal     Neuro/Psych negative neurological ROS     GI/Hepatic negative GI ROS, Neg liver ROS,   Endo/Other  diabetes, Gestational  Renal/GU negative Renal ROS     Musculoskeletal negative musculoskeletal ROS (+)   Abdominal   Peds  Hematology negative hematology ROS (+)   Anesthesia Other Findings Day of surgery medications reviewed with the patient.  Reproductive/Obstetrics (+) Pregnancy                             Anesthesia Physical Anesthesia Plan  ASA: II  Anesthesia Plan: Epidural   Post-op Pain Management:    Induction:   PONV Risk Score and Plan: Treatment may vary due to age or medical condition  Airway Management Planned: Natural Airway  Additional Equipment:   Intra-op Plan:   Post-operative Plan:   Informed Consent: I have reviewed the patients History and Physical, chart, labs and discussed the procedure including the risks, benefits and alternatives for the proposed anesthesia with the patient or authorized representative who has indicated his/her understanding and acceptance.       Plan Discussed with: CRNA  Anesthesia Plan Comments:         Anesthesia Quick Evaluation

## 2018-10-23 NOTE — Lactation Note (Signed)
This note was copied from a baby's chart. Lactation Consultation Note  Patient Name: Shawna [name] [id number] Date: 10/23/2018 Reason for consult: Initial assessment;Early term 37-38.6wks;Maternal endocrine disorder Type of Endocrine Disorder?: Diabetes(GDM on insulin and glyburide)  9 hours old early term female who is being partially BF and formula fed by his mother, she's a P4 and experienced BF. She was able to BF her first child for 6 months, her second one for 4 months and her third one for 7 months. Mom participated in the [facility] program at the [facility] and she's already familiar with hand expression. However when revising hand expression with mom, noticed that no colostrum was observed, mom also voiced the same concerns when her RN was helping her with hand expression, mom said: I don't have anything, that's why I asked for formula.  Explained to mom that baby's mouth is a much more effective instrument for milk removal but that if she also wanted to supplement with some formula, we'd support her feeding choice. Mom had GDM during the pregnancy and she was on insulin and glyburine, baby's glucose is now WNL. Pointed out to mom though that she'll need to follow supplementation guidelines if she's planning on supplementing with formula, baby got 35 ml in his last feeding. Reviewed formula supplementation guidelines with parents and now they said they both understand how it goes.   Offered assistance with latch and mom agreed to wake baby up to feed. Baby was vigorous and latching was easy after doing some suck training. LC took baby to mom's breast in football position and he was able to latch without any difficulties. No audible swallows were noted though, LC tried hand expression again while mom was nursing on the other breast and still no colostrum. Mom voiced that her milk also took a few days to come in with her other kids, but that she was OK with supplementing with Rush Barer. Mom doesn't have a pump  at home, [facility] offered a hand pump from the hospital, instructions, cleaning and storage were reviewed, as well as breastmilk storage guidelines. Reviewed cluster feeding, feeding cues and normal newborn behavior.  Feeding plan:  1. Encouraged mom to feed baby STS 8-12 times/24 hours or sooner if feeding cues are present 2. If baby does not wake up by the 3 hour mark, mom will wake him up to feed 3. Hand expression and finger/spoon feeding (once she sees drops) were also encouraged 4. Parents will continue supplementing baby with Rush Barer Gentle in the meantime according to supplementation guidelines using a bottle with a slow flow nipple  BF brochure (SP), BF resources and feeding diary (SP) were reviewed. Parents reported all questions and concerns were answered, they're both aware of LC services and will call PRN.  Maternal Data Formula Feeding for Exclusion: Yes Reason for exclusion: Mother's choice to formula and breast feed on admission Has patient been taught Hand Expression?: Yes Does the patient have breastfeeding experience prior to this delivery?: Yes  Feeding Feeding Type: Breast Fed  LATCH Score Latch: Grasps breast easily, tongue down, lips flanged, rhythmical sucking.  Audible Swallowing: None  Type of Nipple: Everted at rest and after stimulation  Comfort (Breast/Nipple): Soft / non-tender  Hold (Positioning): Assistance needed to correctly position infant at breast and maintain latch.  LATCH Score: 7  Interventions Interventions: Breast feeding basics reviewed;Assisted with latch;Skin to skin;Breast massage;Hand express;Breast compression;Adjust position;Support pillows;Hand pump  Lactation Tools Discussed/Used Tools: Pump Breast pump type: Manual WIC Program: Yes Pump Review:  Setup, frequency, and cleaning;Milk Storage Initiated by:: MPeck Date initiated:: 10/23/18   Consult Status Consult Status: Follow-up Date: 10/24/18 Follow-up type:  In-patient    Finley Chevez Venetia Constable 10/23/2018, 7:03 PM

## 2018-10-23 NOTE — Progress Notes (Signed)
CSW acknowledged consult for MOB's need for assistance with Medicaid. CSW Chief Technology Officer who is planning to meet with MOB after MOB is out of labor and delivery. CSW signing off, financial counseling to assist with Medicaid needs. Please re-consult if new needs arise.  Celso Sickle, LCSWA Clinical Social Worker [facility] Cell#: [phone]

## 2018-10-23 NOTE — Progress Notes (Signed)
[employer] interpre[employer] utilized for epidural education and placement. Monica [phone]) Will [phone])

## 2018-10-24 LAB — CBC
HCT: 33.3 % — ABNORMAL LOW (ref 36.0–46.0)
Hemoglobin: 11.1 g/dL — ABNORMAL LOW (ref 12.0–15.0)
MCH: 30.6 pg (ref 26.0–34.0)
MCHC: 33.3 g/dL (ref 30.0–36.0)
MCV: 91.7 fL (ref 80.0–100.0)
Platelets: 263 10*3/uL (ref 150–400)
RBC: 3.63 MIL/uL — ABNORMAL LOW (ref 3.87–5.11)
RDW: 13.4 % (ref 11.5–15.5)
WBC: 10.3 10*3/uL (ref 4.0–10.5)
nRBC: 0 % (ref 0.0–0.2)

## 2018-10-24 MED ORDER — SENNOSIDES-DOCUSATE SODIUM 8.6-50 MG PO TABS: 2.0000 | ORAL_TABLET | ORAL | 0 refills | Status: DC

## 2018-10-24 MED ORDER — IBUPROFEN 600 MG PO TABS: 600.0000 mg | ORAL_TABLET | Freq: Four times a day (QID) | ORAL | 0 refills | Status: DC

## 2018-10-24 MED ORDER — ACETAMINOPHEN 325 MG PO TABS: 650.0000 mg | ORAL_TABLET | ORAL | 0 refills | Status: DC | PRN

## 2018-10-24 NOTE — Progress Notes (Signed)
Patient requesting to rent a DEBP until she can obtain a pump from [facility]. Patient advised since she is mostly bottle feeding she needs to use her hand pump if baby does not latch and feed for at least 15 minutes. Patient has [facility] and will call them on Monday. RN demonstrated to patient how to use her hand pump.

## 2018-10-24 NOTE — Lactation Note (Signed)
This note was copied from a baby's chart. Lactation Consultation Note  Patient Name: Boy Gerlean RenRuth Cortes-Pena ZOXWR'UToday's Date: 10/24/2018 Reason for consult: Follow-up assessment;Early term 37-38.6wks;Maternal endocrine disorder Type of Endocrine Disorder?: Diabetes  Visited with P4 Mom of ET infant at 5127 hrs old.  Mom and baby may be discharged today.   Baby is breastfeeding 15-20 mins prior to receiving formula by bottle (per Mom's choice).  Baby at 4.6% weight loss with adequate output noted.  Mom encouraged to do breast massage and hand expression, and offer breast with cues.  Mom has a hand pump and knows how to use this.   Mom states baby latches well, and declined any assistance.   Milk was delayed with other babies, but she was able to successfully breastfeed for 4-7 months.  Mom knows to call prn for concerns.     Interventions Interventions: Breast feeding basics reviewed;Skin to skin;Breast massage;Hand express;Hand pump  Lactation Tools Discussed/Used Tools: Pump Breast pump type: Manual   Consult Status Consult Status: Complete Date: 10/24/18 Follow-up type: Call as needed    Judee ClaraSmith, Mitcheal Sweetin E 10/24/2018, 12:18 PM

## 2018-10-29 ENCOUNTER — Other Ambulatory Visit: Payer: Self-pay

## 2018-10-29 ENCOUNTER — Encounter: Payer: Self-pay | Admitting: Obstetrics & Gynecology

## 2018-11-09 DIAGNOSIS — Z029 Encounter for administrative examinations, unspecified: Secondary | ICD-10-CM

## 2018-11-19 ENCOUNTER — Other Ambulatory Visit: Payer: Self-pay | Admitting: *Deleted

## 2018-11-19 DIAGNOSIS — O24429 Gestational diabetes mellitus in childbirth, unspecified control: Secondary | ICD-10-CM

## 2018-11-20 ENCOUNTER — Other Ambulatory Visit: Payer: Self-pay

## 2018-11-20 ENCOUNTER — Ambulatory Visit (INDEPENDENT_AMBULATORY_CARE_PROVIDER_SITE_OTHER): Payer: Self-pay | Admitting: Advanced Practice Midwife

## 2018-11-20 VITALS — BP 123/92 | HR 85 | Wt 206.7 lb

## 2018-11-20 DIAGNOSIS — O24429 Gestational diabetes mellitus in childbirth, unspecified control: Secondary | ICD-10-CM

## 2018-11-20 DIAGNOSIS — Z8632 Personal history of gestational diabetes: Secondary | ICD-10-CM

## 2018-11-20 NOTE — Patient Instructions (Addendum)
[facility] Department-Family Planning       Phone: [phone]   [facility]      Phone: [phone]   Take Gas-X for bloating.   Distensin abdominal Abdominal Bloating Cuando padece de distensin abdominal, su abdomen se siente lleno, tenso o con dolor. Tambin puede lucir ms grande o hinchado que lo normal (distendido). Las causas comunes de la distensin abdominal incluyen lo siguiente:  Psychologist, sport and exercise.  Estreimiento.  Problemas para digerir los alimentos.  Comer en exceso.  Sndrome de colon irritable. Este cuadro clnico afecta al intestino grueso.  Intolerancia a Water quality scientist. Es la incapacidad para Therapist, nutritional, un azcar natural en productos lcteos.  Enfermedad celaca. Esta es una enfermedad que afecta la capacidad de digerir gluten, una protena presente en algunos granos.  Gastroparesia. Esta es una afeccin que retrasa el movimiento de alimentos en el estmago e intestino delgado. Es ms frecuente en personas con diabetes mellitus.  Enfermedad de reflujo gastroesofgico (ERGE). Esta es una afeccin digestiva que hace que el cido de su estmago vuelva al esfago.  Retencin urinaria. Esto significa que el cuerpo retiene la [place] y no se puede vaciar la vejiga por completo. Siga estas indicaciones en su casa: Comida y bebida  Evite comer en exceso.  Trate de no tragar aire cuando habla o come.  Evite ingerir alimentos cuando est acostado.  Evite estos alimentos y bebidas: ? Alimentos que pueden causar gases, como el brcoli, repollo, Counsellor y frijoles cocidos. ? Bebidas con gas. ? Caramelos duros. ? Engineer, mining. Medicamentos  [employer] de venta libre y los recetados solamente como se lo haya indicado el mdico.  Tome medicamentos probiticos. Estos medicamentos contienen bacterias vivas o levadura que pueden ayudar a la digestin.  Tome cpsulas de aceite de menta recubiertas. Actividad  Trate de Education officer, environmental  ejercicio a diario. El ejercicio puede ayudarlo a disminuir la distensin que causan los gases y Acupuncturist estreimiento. Instrucciones generales  Concurra a todas las visitas de control como se lo haya indicado el mdico. Esto es importante. Comunquese con un mdico si:  Tiene nuseas y vmitos.  Tiene diarrea.  Siente dolor abdominal.  Tiene prdida o aumento de peso fuera de lo comn.  Siente un dolor intenso y los medicamentos no Contractor. Solicite ayuda de inmediato si:  Tiene un dolor intenso en el pecho.  Tiene dificultad para respirar.  Le falta el aire.  Tiene problemas para orinar.  La orina es ms oscura que lo normal.  Observa sangre en las heces o tiene heces oscuras de aspecto alquitranado. Resumen  La distensin abdominal significa que el abdomen est hinchado.  Las causas ms comunes de esta afeccin son tragar [place], el estreimiento y los problemas para digerir alimentos.  Evite comer en exceso y tragar aire.  Evite alimentos que causan gases, bebidas con gas, caramelos duros y [place] de [address]. Esta informacin no tiene Theme park manager el consejo del mdico. Asegrese de hacerle al mdico cualquier pregunta que tenga. Document Released: 02/12/2017 Document Revised: 02/12/2017 Document Reviewed: 02/12/2017 Elsevier Interactive Patient Education  2019 [employer].

## 2018-11-20 NOTE — Progress Notes (Signed)
Subjective:     Shawna [name] is a 33 y.o. female who presents for a postpartum visit. She is 4 weeks postpartum following a spontaneous vaginal delivery. I have fully reviewed the prenatal and intrapartum course. The delivery was at 37 gestational weeks. Outcome: spontaneous vaginal delivery. Anesthesia: epidural. Postpartum course has been uncomplicated. Baby's course has been complicated by wt loss. Baby is feeding by both breast and bottle - Gerber good start. States nurse told her baby was loosing wt and suggesting supplementing w/ formula. Bleeding no bleeding. Bowel function is normal. Bladder function is normal. Patient is not sexually active. Contraception method is requesting Nexplanon. Postpartum depression screening: negative.  The following portions of the patient's history were reviewed and updated as appropriate: allergies, current medications, past family history, past medical history, past social history, past surgical history and problem list.  Pt is very sure she had a Pap smear in 2019, but states she was told next one was due 2020. Unable to find record of 2019 Pap in HD records. Denies Hx abnormal Pap.   Review of Systems Pertinent items are noted in HPI.   Objective:    BP (!) 123/92   Pulse 85   Wt 206 lb 11.2 oz (93.8 kg)   BMI 33.87 kg/m   General:  alert, cooperative, appears stated age, no distress and moderately obese   Breasts:  Declined  Lungs: clear to auscultation bilaterally  Heart:  regular rate and rhythm, S1, S2 normal, no murmur, click, rub or gallop  Abdomen: soft, non-tender; bowel sounds normal; no masses,  no organomegaly   Vulva:  not evaluated  Vagina: not evaluated        Assessment:     Nml postpartum exam. Pap smear not done at today's visit.   Plan:    1. Contraception: Plans Nexplanon, but is self-pay. Directed to HD or planned Parenthood. No IC until [facility] initiated.  2. Pt states she was told to have Pap this year. Given contact  info for [facility], free cervical cancer screening or can get it at HD.  3. Follow up in: 1 year  or as needed.

## 2018-11-21 LAB — GLUCOSE TOLERANCE, 2 HOURS
Glucose, 2 hour: 162 mg/dL — ABNORMAL HIGH (ref 65–139)
Glucose, GTT - Fasting: 89 mg/dL (ref 65–99)

## 2018-11-23 ENCOUNTER — Encounter: Payer: Self-pay | Admitting: Advanced Practice Midwife

## 2018-11-23 ENCOUNTER — Encounter (HOSPITAL_COMMUNITY): Payer: Self-pay

## 2018-11-23 ENCOUNTER — Other Ambulatory Visit: Payer: Self-pay

## 2018-11-23 ENCOUNTER — Telehealth: Payer: Self-pay | Admitting: Family Medicine

## 2018-11-23 ENCOUNTER — Emergency Department (HOSPITAL_COMMUNITY)
Admission: EM | Admit: 2018-11-23 | Discharge: 2018-11-24 | Disposition: A | Discharge status: Home / Self Care | Payer: Self-pay | Attending: Emergency Medicine | Admitting: Emergency Medicine

## 2018-11-23 DIAGNOSIS — Z5321 Procedure and treatment not carried out due to patient leaving prior to being seen by health care provider: Secondary | ICD-10-CM | POA: Insufficient documentation

## 2018-11-23 DIAGNOSIS — R109 Unspecified abdominal pain: Secondary | ICD-10-CM | POA: Insufficient documentation

## 2018-11-23 LAB — URINALYSIS, ROUTINE W REFLEX MICROSCOPIC
Bacteria, UA: NONE SEEN
Bilirubin Urine: NEGATIVE
Glucose, UA: NEGATIVE mg/dL
Hgb urine dipstick: NEGATIVE
Ketones, ur: NEGATIVE mg/dL
Nitrite: NEGATIVE
Protein, ur: NEGATIVE mg/dL
Specific Gravity, Urine: 1.027 (ref 1.005–1.030)
pH: 5 (ref 5.0–8.0)

## 2018-11-23 LAB — COMPREHENSIVE METABOLIC PANEL
ALT: 32 U/L (ref 0–44)
AST: 27 U/L (ref 15–41)
Albumin: 3.9 g/dL (ref 3.5–5.0)
Alkaline Phosphatase: 118 U/L (ref 38–126)
Anion gap: 8 (ref 5–15)
BUN: 21 mg/dL — ABNORMAL HIGH (ref 6–20)
CALCIUM: 9.1 mg/dL (ref 8.9–10.3)
CO2: 25 mmol/L (ref 22–32)
CREATININE: 0.76 mg/dL (ref 0.44–1.00)
Chloride: 105 mmol/L (ref 98–111)
GFR calc Af Amer: >60 mL/min (ref >60)
GFR calc non Af Amer: >60 mL/min (ref >60)
Glucose, Bld: 119 mg/dL — ABNORMAL HIGH (ref 70–99)
Potassium: 3.9 mmol/L (ref 3.5–5.1)
Sodium: 138 mmol/L (ref 135–145)
Total Bilirubin: 0.2 mg/dL — ABNORMAL LOW (ref 0.3–1.2)
Total Protein: 7.3 g/dL (ref 6.5–8.1)

## 2018-11-23 LAB — CBC
HCT: 38.9 % (ref 36.0–46.0)
HEMOGLOBIN: 13 g/dL (ref 12.0–15.0)
MCH: 29.5 pg (ref 26.0–34.0)
MCHC: 33.4 g/dL (ref 30.0–36.0)
MCV: 88.2 fL (ref 80.0–100.0)
Platelets: 333 10*3/uL (ref 150–400)
RBC: 4.41 MIL/uL (ref 3.87–5.11)
RDW: 12.5 % (ref 11.5–15.5)
WBC: 6.6 10*3/uL (ref 4.0–10.5)
nRBC: 0 % (ref 0.0–0.2)

## 2018-11-23 LAB — I-STAT BETA HCG BLOOD, ED (MC, WL, AP ONLY): I-stat hCG, quantitative: <5 m[IU]/mL (ref <5)

## 2018-11-23 LAB — LIPASE, BLOOD: Lipase: 33 U/L (ref 11–51)

## 2018-11-23 NOTE — Telephone Encounter (Signed)
Called patient with Spanish interpreter to inform her of an appointment. She wanted to know why she needed to get another 2 hr done. Morrie Sheldon RN explained to her because she was tested too so, and it could be a false reading. Patient stated she would be here.

## 2018-11-23 NOTE — ED Triage Notes (Signed)
Pt arrives POV for eval of RUQ abd pain onset yesterday AM. Hx of gallstones, states this feels the same. Endorses burning RUQ pain

## 2018-11-24 NOTE — ED Notes (Signed)
Called for x4 no reply 

## 2018-11-24 NOTE — ED Notes (Signed)
Called for x5. Pt not seen in lobby.

## 2018-12-03 ENCOUNTER — Other Ambulatory Visit: Payer: Self-pay | Admitting: *Deleted

## 2018-12-03 DIAGNOSIS — O24439 Gestational diabetes mellitus in the puerperium, unspecified control: Secondary | ICD-10-CM

## 2018-12-04 ENCOUNTER — Other Ambulatory Visit: Payer: Self-pay

## 2019-09-27 IMAGING — US US MFM OB FOLLOW UP
1 series · 13 of 28 positions shown · non-contrast
Comparison: none

[Series 1: us mfm ob follow up · 51 acquisitions, 13 frames shown]
[im 2/51]
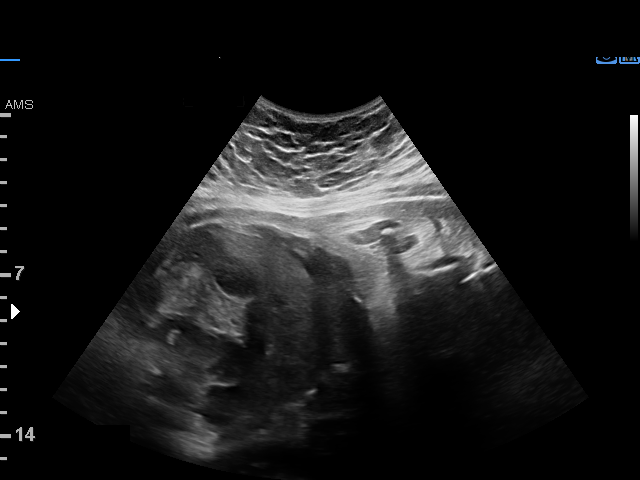
[im 6/51]
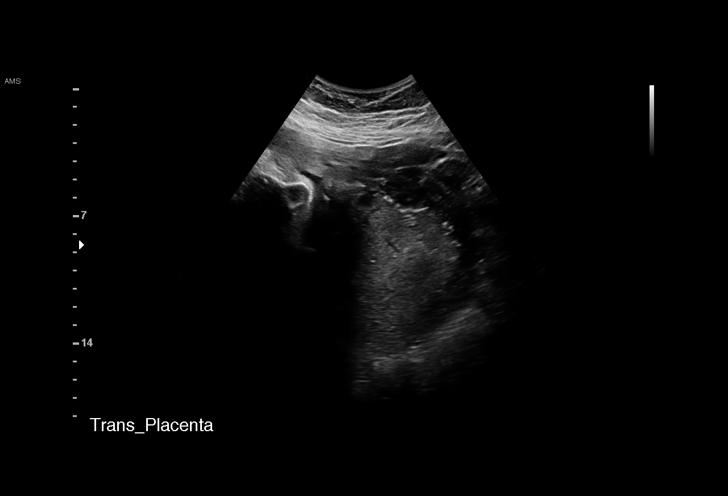
[im 10/51]
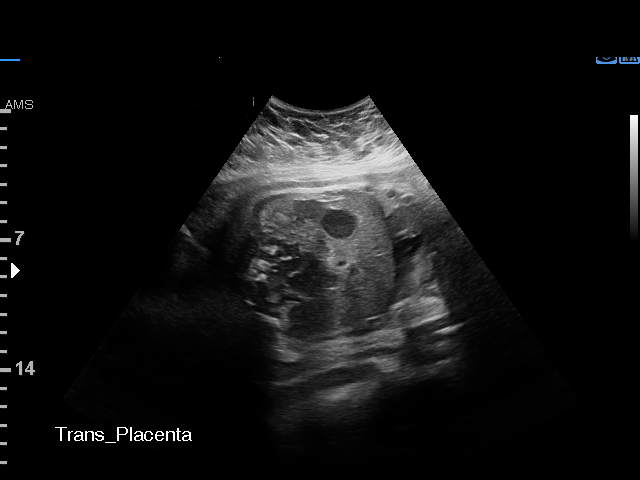
[im 13/51]
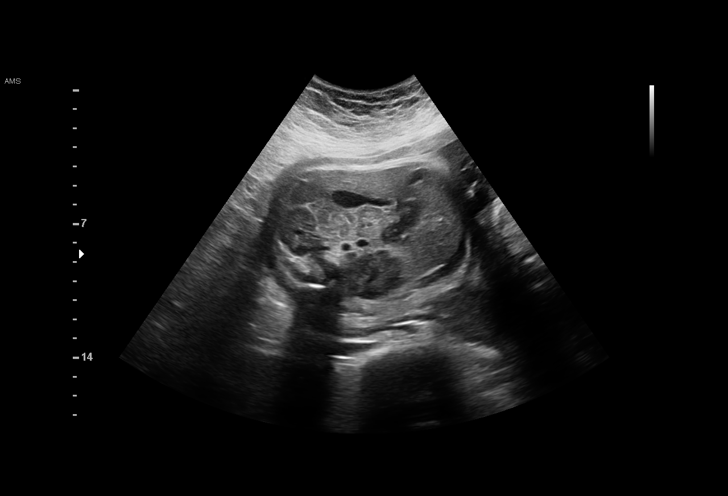
[im 17/51]
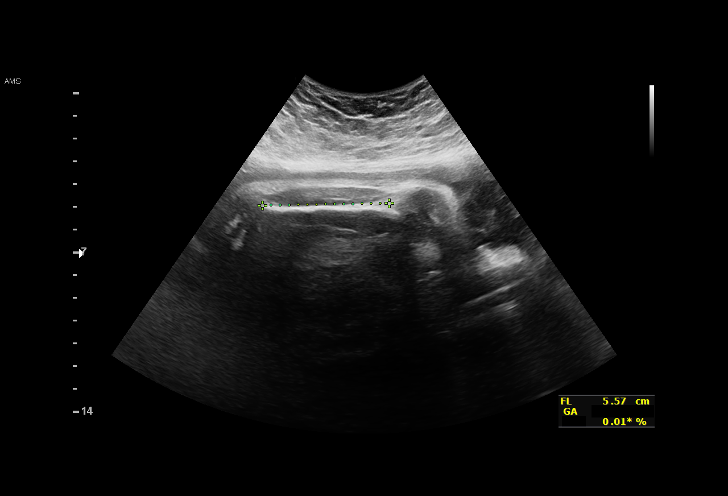
[im 21/51]
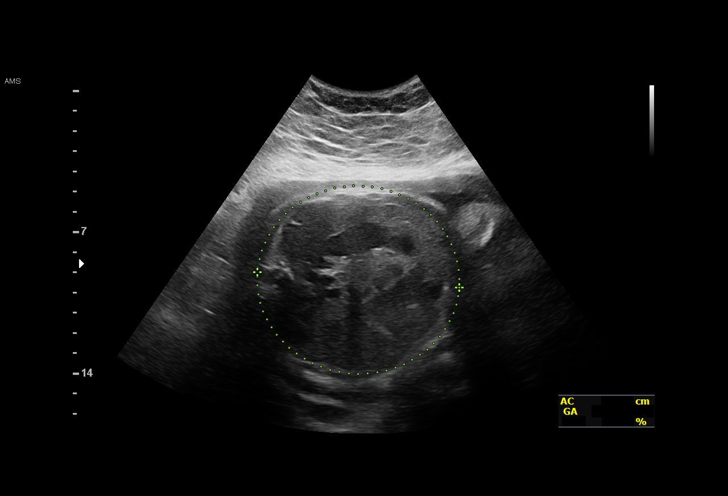
[im 26/51]
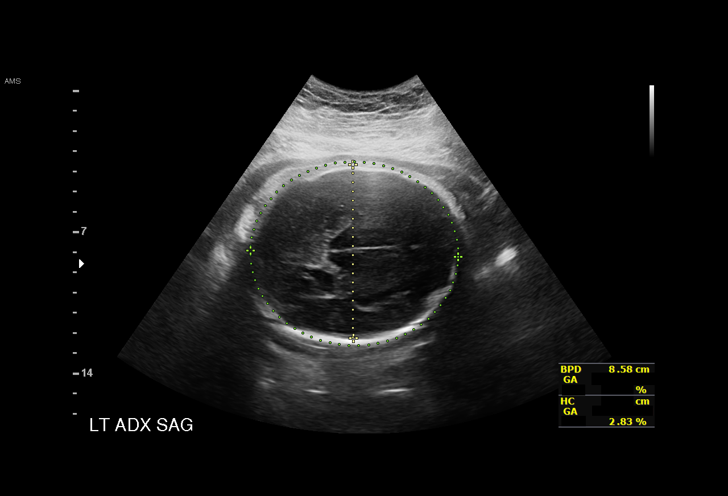
[im 30/51]
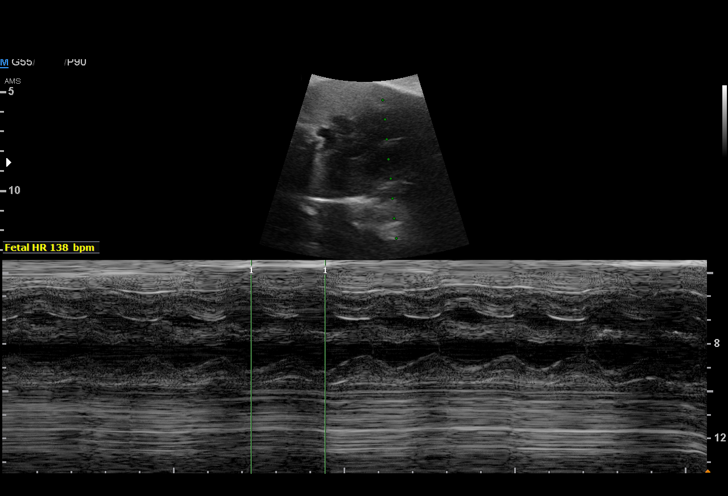
[im 34/51]
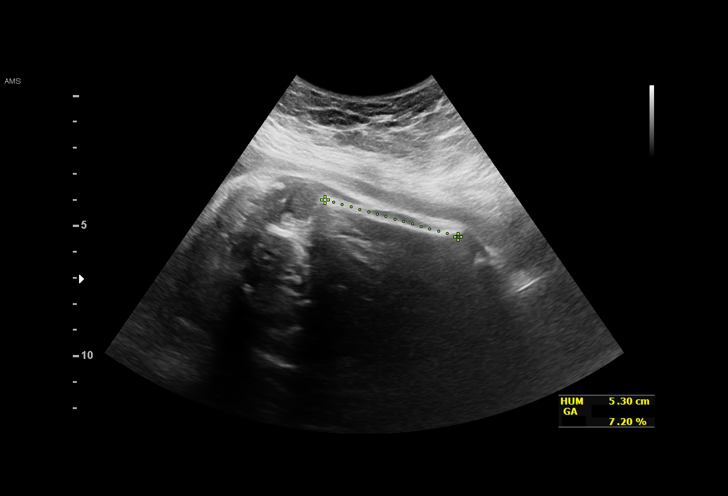
[im 38/51]
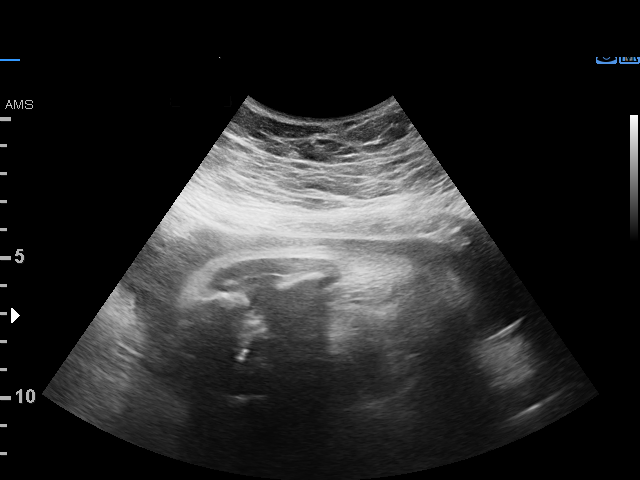
[im 41/51]
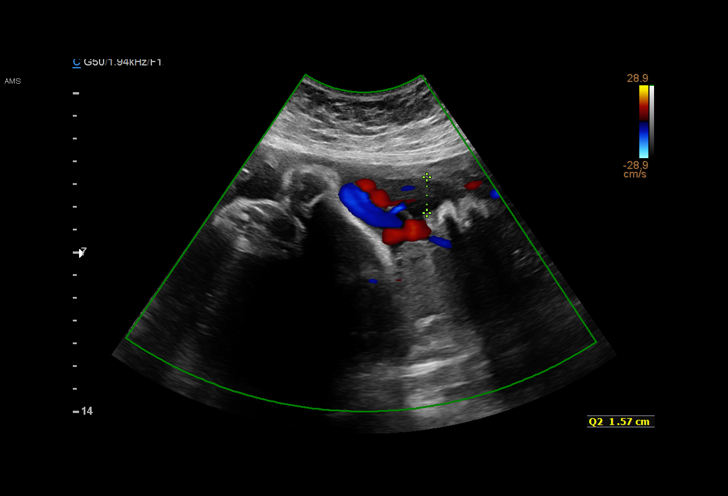
[im 45/51]
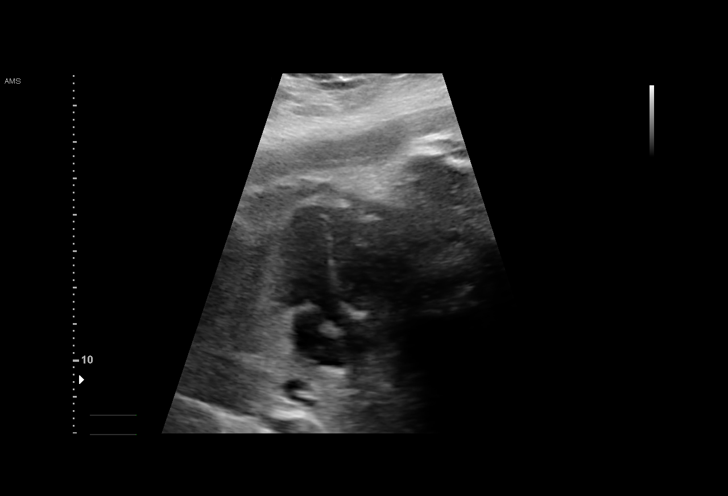
[im 49/51]
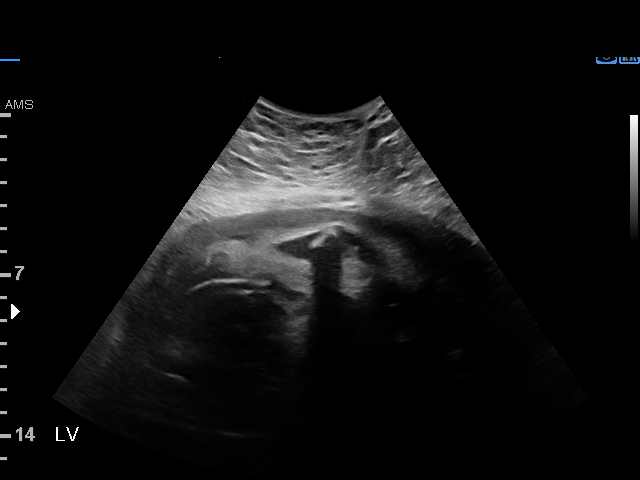

[13 of 28 positions shown; findings below may reference images not displayed]

OB/Gyn Clinic

 ----------------------------------------------------------------------

 ----------------------------------------------------------------------
Indications

  35 weeks gestation of pregnancy
  Gestational diabetes in pregnancy, insulin
  controlled
  Obesity complicating pregnancy, second
  trimester (Pre Pregnancy BMI 36)
  History of congenital or genetic condition
  (Nephew with T21 - declined KM)
 ----------------------------------------------------------------------
Vital Signs

                                                Height:        5'5"
Fetal Evaluation

 Num Of Fetuses:         1
 Fetal Heart Rate(bpm):  138
 Cardiac Activity:       Observed
 Presentation:           Cephalic
 Placenta:               Posterior

 Amniotic Fluid
 AFI FV:      Oligohydramnios

 AFI Sum(cm)     %Tile       Largest Pocket(cm)
 4.19            < 3
 RUQ(cm)       RLQ(cm)       LUQ(cm)        LLQ(cm)
 0
Biophysical Evaluation

 Amniotic F.V:   Pocket => 2 cm two         F. Tone:        Observed
                 planes
 F. Movement:    Observed                   Score:          [DATE]
 F. Breathing:   Observed
Biometry

 BPD:        86  mm     G. Age:  34w 5d         36  %    CI:        75.99   %   70 - 86
                                                         FL/HC:      19.3   %   20.1 -
 HC:      312.7  mm     G. Age:  35w 0d         15  %    HC/AC:      1.02       0.93 -
 AC:      306.2  mm     G. Age:  34w 4d         37  %    FL/BPD:     70.3   %   71 - 87
 FL:       60.5  mm     G. Age:  31w 3d        < 3  %    FL/AC:      19.8   %   20 - 24
 HUM:      52.8  mm     G. Age:  30w 5d        < 5  %

 Est. FW:    8884  gm          5 lb      29  %
OB History

 Gravidity:    7         Term:   3        Prem:   0        SAB:   3
 Ectopic:      0        Living:  3
Gestational Age

 LMP:           41w 1d       Date:   12/24/17                 EDD:   09/30/18
 U/S Today:     34w 0d                                        EDD:   11/19/18
 Best:          35w 2d    Det. By:   U/S C R L  (04/29/18)    EDD:   11/10/18
Anatomy

 Cranium:               Appears normal         Aortic Arch:            Previously seen
 Cavum:                 Previously seen        Ductal Arch:            Not well visualized
 Ventricles:            Appears normal         Diaphragm:              Previously seen
 Choroid Plexus:        Previously seen        Stomach:                Appears normal, left
                                                                       sided
 Cerebellum:            Previously seen        Abdomen:                Appears normal
 Posterior Fossa:       Previously seen        Abdominal Wall:         Previously seen
 Nuchal Fold:           Previously seen        Cord Vessels:           Previously seen
 Face:                  Orbits and profile     Kidneys:                Appear normal
                        previously seen
 Lips:                  Previously seen        Bladder:                Appears normal
 Thoracic:              Appears normal         Spine:                  Limited views
                                                                       previously seen
 Heart:                 Appears normal         Upper Extremities:      Previously seen
                        (4CH, axis, and situs
 RVOT:                  Previously seen        Lower Extremities:      Previously seen
 LVOT:                  Previously seen

 Other:  Fetus appears to be a male. Heels and Nasal bone previously
         visualized. Technically difficult due to maternal habitus and fetal
         position.
Cervix Uterus Adnexa

 Cervix
 Not visualized (advanced GA >71wks)
 Left Ovary
 No adnexal mass visualized.

 Right Ovary
 No adnexal mass visualized.
Impression

 Fetal growth is appropriate for gestational age.
 Oligohydramnios is seen. Antenatal testing is reassuring.
 BPP [DATE].

 I explained the findings with help of language interpreter.
 Patient reports increased mucoid vaginal discharge.

 Discussed with Dr. J Pando. I recommend inpatient
 management and reevaluation of AFI in 48 to 72 hours.
Recommendations

 Continue weekly antenatal testing till delivery.
                   Curry, Ejaz

## 2019-10-04 IMAGING — US US FETAL BPP W/ NON-STRESS
1 series · 13 of 15 positions shown · non-contrast
Comparison: none

[Series 1: us fetal bpp w/nonstress · 15 acquisitions, 13 frames shown]
[im 1/15]
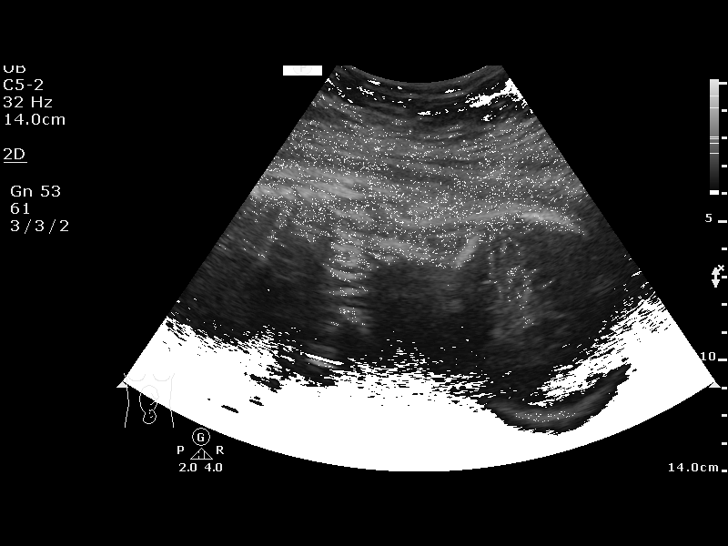
[im 2/15]
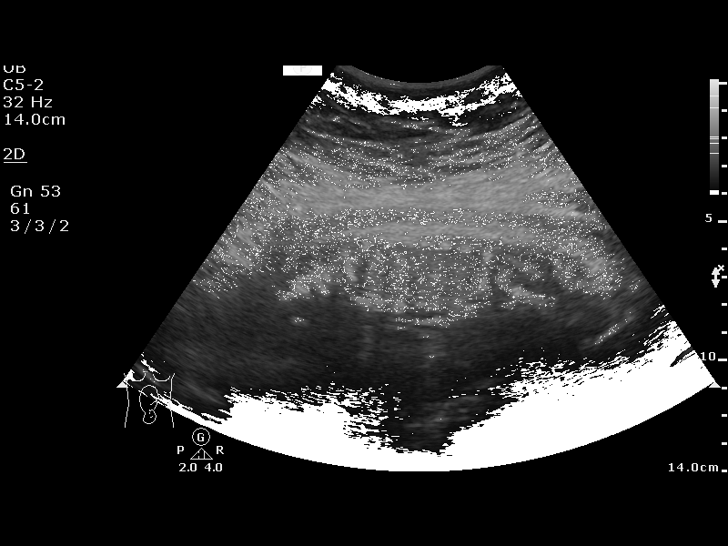
[im 3/15]
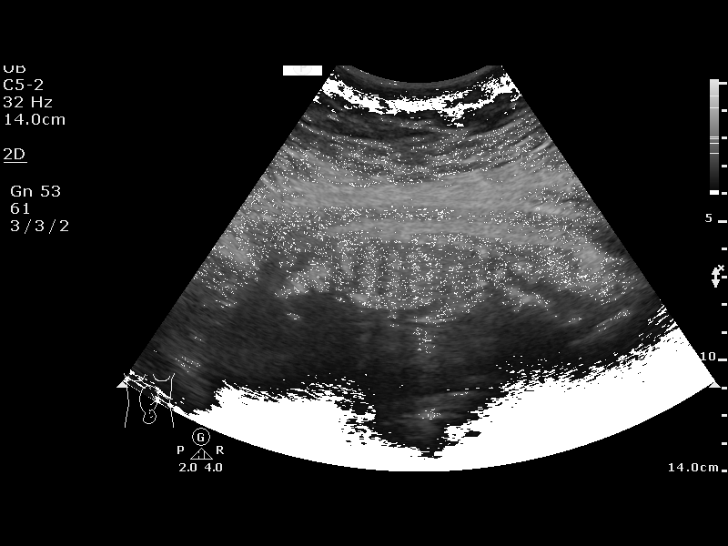
[im 5/15]
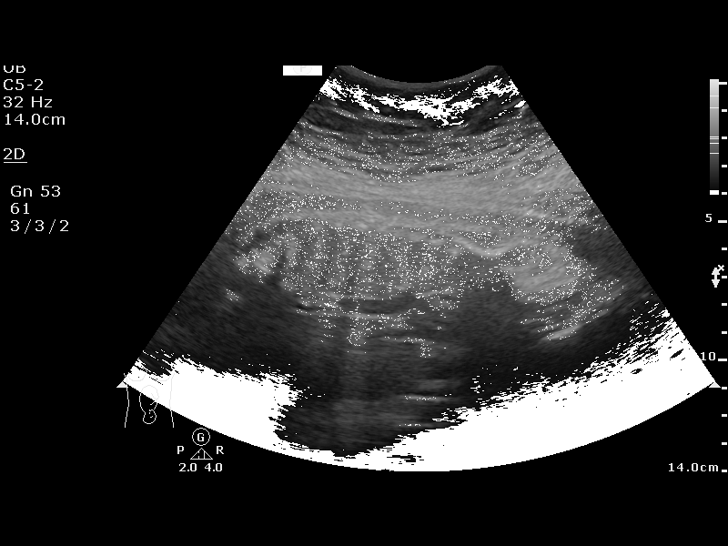
[im 6/15]
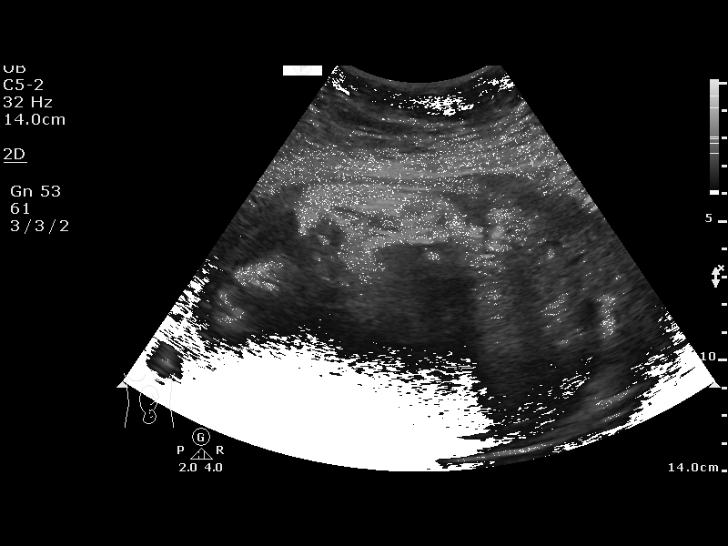
[im 7/15]
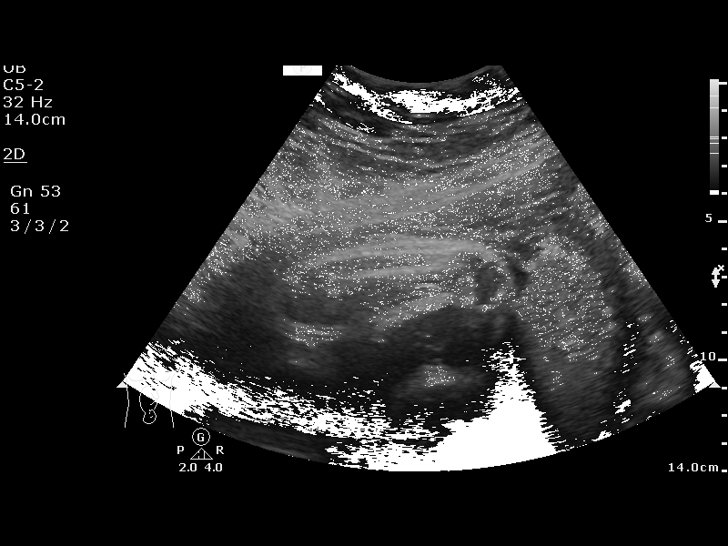
[im 8/15]
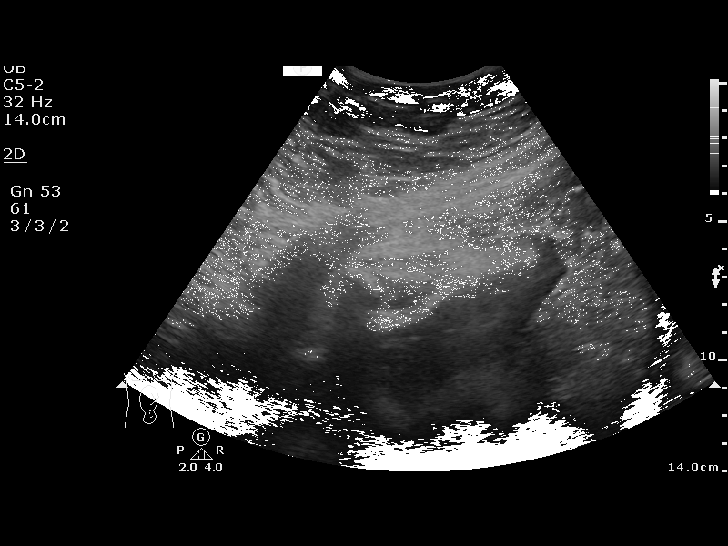
[im 9/15]
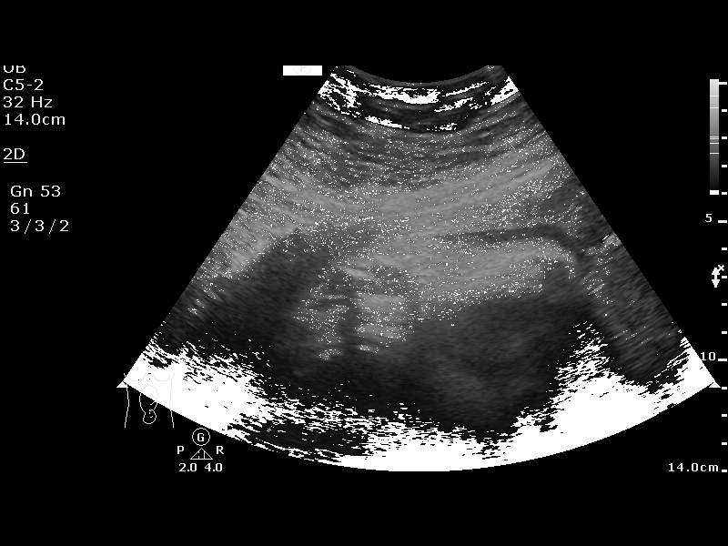
[im 10/15]
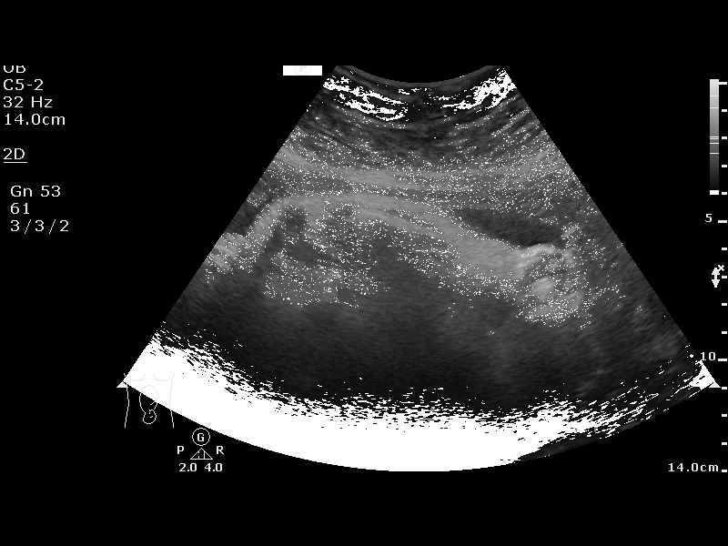
[im 11/15]
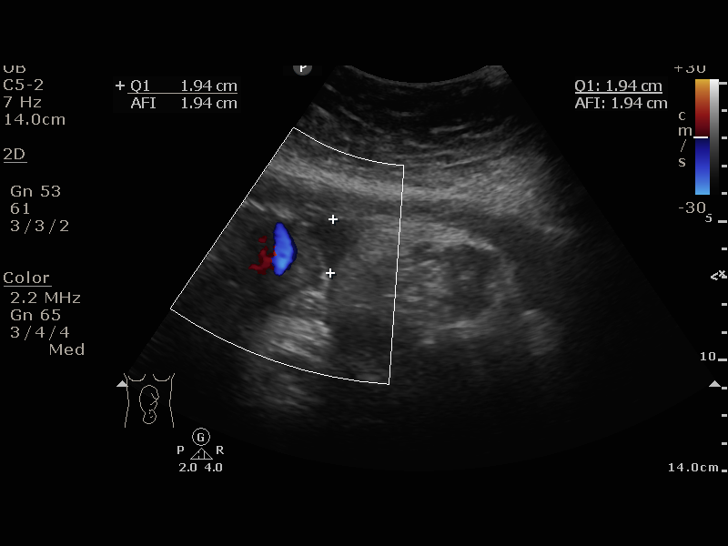
[im 13/15]
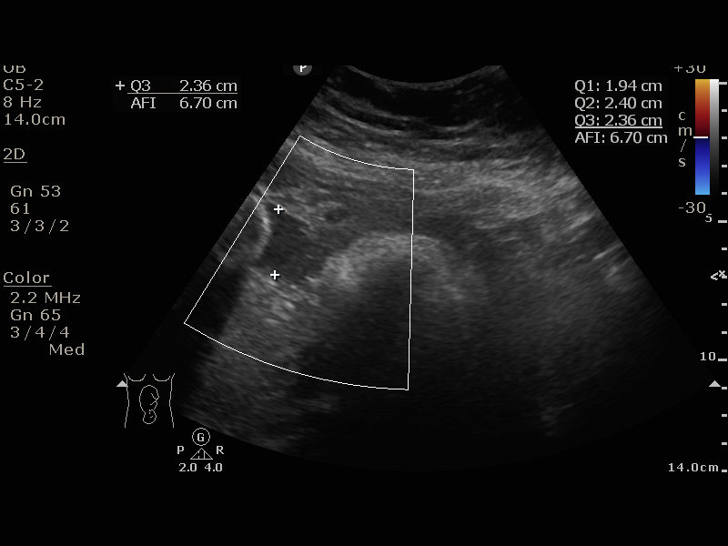
[im 14/15]
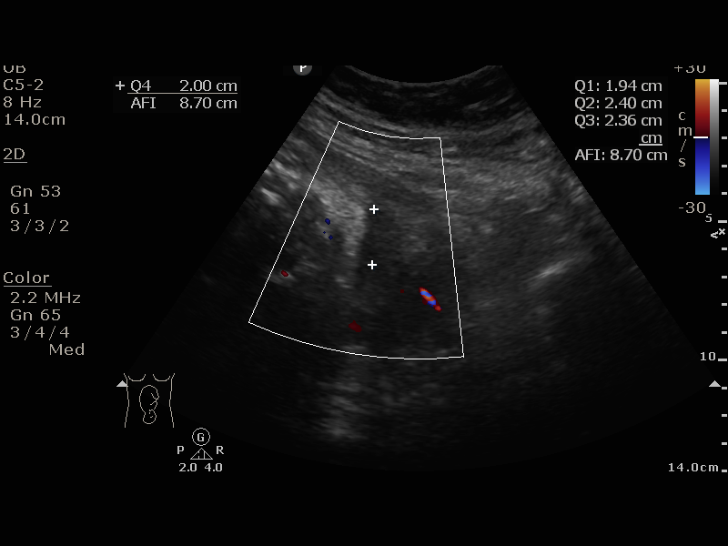
[im 15/15]
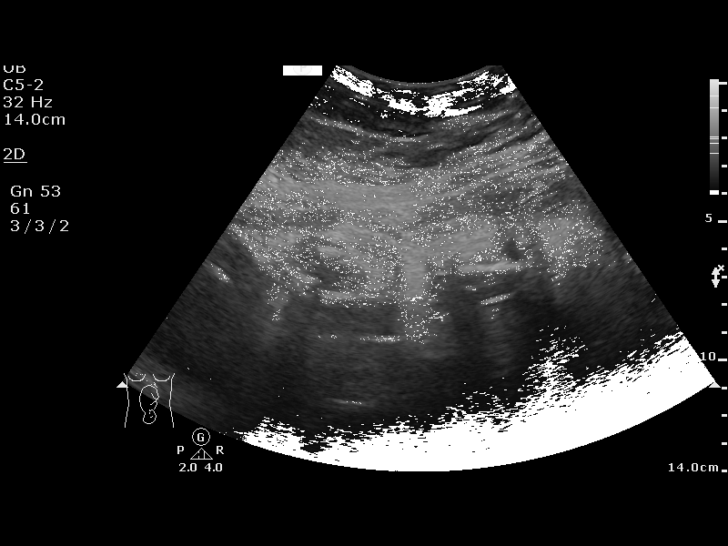

[13 of 15 positions shown; findings below may reference images not displayed]

[facility]
 Attending:        Soyoung Marulanda      Secondary Phy.:   3rd Nursing- [employer]
                                                            OB
                   [REDACTED]care
                   Healthcare                               [HOSPITAL]

  1  BPP
                                                       [facility]
 ----------------------------------------------------------------------

  1                             None
 ----------------------------------------------------------------------
Service(s) Provided

 ----------------------------------------------------------------------
Indications

  36 weeks gestation of pregnancy
 ----------------------------------------------------------------------
Vital Signs

                                                Height:        5'5"
Fetal Evaluation

 Num Of Fetuses:         1
 Preg. Location:         Intrauterine
 Cardiac Activity:       Observed
 Presentation:           Cephalic

 Amniotic Fluid
 AFI FV:      Within normal limits

 AFI Sum(cm)     %Tile       Largest Pocket(cm)
 8.7             13
 RUQ(cm)       RLQ(cm)       LUQ(cm)        LLQ(cm)
 1.94          2

 Comment:    BPP [DATE] Continue antenatal testing weekly.
Biophysical Evaluation

 Amniotic F.V:   Pocket => 2 cm two         F. Tone:        Observed
                 planes
 F. Movement:    Observed                   N.S.T:          Reactive
 F. Breathing:   Observed                   Score:          [DATE]
OB History

 Gravidity:    7         Term:   3        Prem:   0        SAB:   3
 Ectopic:      0        Living:  3
Gestational Age

 LMP:           42w 1d       Date:   12/24/17                 EDD:   09/30/18
 Best:          36w 2d    Det. By:   U/S C R L  (04/29/18)    EDD:   11/10/18
Impression

 Antenatal testing is reassuring with BPP [DATE]. Normal
 amniotic fluid volume.
Recommendations

 Continue weekly antenatal testing till delivery.
                 Flame, Jannelle

## 2022-04-19 LAB — GLUCOSE, POCT (MANUAL RESULT ENTRY): POC Glucose: 421 mg/dl — AB (ref 70–99)

## 2022-04-19 NOTE — Progress Notes (Signed)
Pt only wanted BP checked today

## 2022-07-16 ENCOUNTER — Emergency Department (HOSPITAL_COMMUNITY): Payer: Self-pay

## 2022-07-16 ENCOUNTER — Encounter (HOSPITAL_COMMUNITY): Payer: Self-pay | Admitting: Emergency Medicine

## 2022-07-16 ENCOUNTER — Emergency Department (HOSPITAL_COMMUNITY)
Admission: EM | Admit: 2022-07-16 | Discharge: 2022-07-17 | Discharge status: Against Medical Advice | Payer: Self-pay | Attending: Emergency Medicine | Admitting: Emergency Medicine

## 2022-07-16 ENCOUNTER — Other Ambulatory Visit: Payer: Self-pay

## 2022-07-16 DIAGNOSIS — R112 Nausea with vomiting, unspecified: Secondary | ICD-10-CM | POA: Insufficient documentation

## 2022-07-16 DIAGNOSIS — R1011 Right upper quadrant pain: Secondary | ICD-10-CM | POA: Insufficient documentation

## 2022-07-16 DIAGNOSIS — R197 Diarrhea, unspecified: Secondary | ICD-10-CM | POA: Insufficient documentation

## 2022-07-16 DIAGNOSIS — Z5321 Procedure and treatment not carried out due to patient leaving prior to being seen by health care provider: Secondary | ICD-10-CM | POA: Insufficient documentation

## 2022-07-16 LAB — CBC WITH DIFFERENTIAL/PLATELET
Abs Immature Granulocytes: 0.02 10*3/uL (ref 0.00–0.07)
Basophils Absolute: 0 10*3/uL (ref 0.0–0.1)
Basophils Relative: 0 %
Eosinophils Absolute: 0.2 10*3/uL (ref 0.0–0.5)
Eosinophils Relative: 2 %
HCT: 41.5 % (ref 36.0–46.0)
Hemoglobin: 14.3 g/dL (ref 12.0–15.0)
Immature Granulocytes: 0 %
Lymphocytes Relative: 30 %
Lymphs Abs: 2.6 10*3/uL (ref 0.7–4.0)
MCH: 31.2 pg (ref 26.0–34.0)
MCHC: 34.5 g/dL (ref 30.0–36.0)
MCV: 90.6 fL (ref 80.0–100.0)
Monocytes Absolute: 0.6 10*3/uL (ref 0.1–1.0)
Monocytes Relative: 6 %
Neutro Abs: 5.4 10*3/uL (ref 1.7–7.7)
Neutrophils Relative %: 62 %
Platelets: 382 10*3/uL (ref 150–400)
RBC: 4.58 MIL/uL (ref 3.87–5.11)
RDW: 12.1 % (ref 11.5–15.5)
WBC: 8.8 10*3/uL (ref 4.0–10.5)
nRBC: 0 % (ref 0.0–0.2)

## 2022-07-16 LAB — COMPREHENSIVE METABOLIC PANEL
ALT: 18 U/L (ref 0–44)
AST: 17 U/L (ref 15–41)
Albumin: 3.7 g/dL (ref 3.5–5.0)
Alkaline Phosphatase: 102 U/L (ref 38–126)
Anion gap: 11 (ref 5–15)
BUN: 16 mg/dL (ref 6–20)
CO2: 24 mmol/L (ref 22–32)
Calcium: 9.4 mg/dL (ref 8.9–10.3)
Chloride: 101 mmol/L (ref 98–111)
Creatinine, Ser: 0.55 mg/dL (ref 0.44–1.00)
GFR, Estimated: >60 mL/min (ref >60)
Glucose, Bld: 362 mg/dL — ABNORMAL HIGH (ref 70–99)
Potassium: 4.1 mmol/L (ref 3.5–5.1)
Sodium: 136 mmol/L (ref 135–145)
Total Bilirubin: 0.3 mg/dL (ref 0.3–1.2)
Total Protein: 7 g/dL (ref 6.5–8.1)

## 2022-07-16 LAB — I-STAT BETA HCG BLOOD, ED (MC, WL, AP ONLY): I-stat hCG, quantitative: <5 m[IU]/mL (ref <5)

## 2022-07-16 LAB — LIPASE, BLOOD: Lipase: 31 U/L (ref 11–51)

## 2022-07-16 NOTE — ED Triage Notes (Signed)
Patient reports right abdominal pain with emesis and diarrhea onset last night , history of gallstones , denies fever or chills .

## 2022-07-16 NOTE — ED Provider Triage Note (Signed)
Emergency Medicine Provider Triage Evaluation Note  Shawna [name] , a 36 y.o. female  was evaluated in triage.  Pt complains of RUQ pain, nausea, vomiting, and diarrhea.  She has hx of gallstones and reports this feels similar.  Denies fever/chills..  Review of Systems  Positive: Abdominal pain, N/V/D Negative: fever  Physical Exam  BP (!) 139/95 (BP Location: Right Arm)   Pulse 77   Temp 98.4 F (36.9 C) (Oral)   Resp 16   LMP 06/13/2022   SpO2 98%  Gen:   Awake, no distress   Resp:  Normal effort  MSK:   Moves extremities without difficulty  Other:  Tender RUQ, no murphy's sign  Medical Decision Making  Medically screening exam initiated at 10:07 PM.  Appropriate orders placed.  Shawna [name] was informed that the remainder of the evaluation will be completed by another provider, this initial triage assessment does not replace that evaluation, and the importance of remaining in the ED until their evaluation is complete.  RUQ pain, N/V/D.  Hx of gallstones.  Will check labs, RUQ [place].   Larene Pickett, PA-C 07/16/22 2208

## 2022-07-17 ENCOUNTER — Inpatient Hospital Stay (HOSPITAL_COMMUNITY)
Admission: EM | Admit: 2022-07-17 | Discharge: 2022-07-19 | DRG: 418 | Disposition: A | Discharge status: Home / Self Care | Payer: Self-pay | Attending: Family Medicine | Admitting: Family Medicine

## 2022-07-17 ENCOUNTER — Encounter (HOSPITAL_COMMUNITY): Payer: Self-pay

## 2022-07-17 ENCOUNTER — Other Ambulatory Visit: Payer: Self-pay

## 2022-07-17 ENCOUNTER — Emergency Department (HOSPITAL_COMMUNITY): Payer: Self-pay

## 2022-07-17 DIAGNOSIS — E1165 Type 2 diabetes mellitus with hyperglycemia: Secondary | ICD-10-CM | POA: Diagnosis present

## 2022-07-17 DIAGNOSIS — K8012 Calculus of gallbladder with acute and chronic cholecystitis without obstruction: Principal | ICD-10-CM | POA: Diagnosis present

## 2022-07-17 DIAGNOSIS — R739 Hyperglycemia, unspecified: Secondary | ICD-10-CM

## 2022-07-17 DIAGNOSIS — Z9049 Acquired absence of other specified parts of digestive tract: Secondary | ICD-10-CM

## 2022-07-17 DIAGNOSIS — Z8632 Personal history of gestational diabetes: Secondary | ICD-10-CM

## 2022-07-17 DIAGNOSIS — M461 Sacroiliitis, not elsewhere classified: Secondary | ICD-10-CM | POA: Diagnosis present

## 2022-07-17 DIAGNOSIS — K760 Fatty (change of) liver, not elsewhere classified: Secondary | ICD-10-CM | POA: Diagnosis present

## 2022-07-17 DIAGNOSIS — R519 Headache, unspecified: Secondary | ICD-10-CM | POA: Diagnosis not present

## 2022-07-17 DIAGNOSIS — K5900 Constipation, unspecified: Secondary | ICD-10-CM | POA: Diagnosis present

## 2022-07-17 DIAGNOSIS — Z6841 Body Mass Index (BMI) 40.0 and over, adult: Secondary | ICD-10-CM

## 2022-07-17 DIAGNOSIS — Z833 Family history of diabetes mellitus: Secondary | ICD-10-CM

## 2022-07-17 DIAGNOSIS — Z789 Other specified health status: Secondary | ICD-10-CM

## 2022-07-17 DIAGNOSIS — E119 Type 2 diabetes mellitus without complications: Secondary | ICD-10-CM

## 2022-07-17 DIAGNOSIS — Z79899 Other long term (current) drug therapy: Secondary | ICD-10-CM

## 2022-07-17 DIAGNOSIS — K819 Cholecystitis, unspecified: Secondary | ICD-10-CM | POA: Diagnosis present

## 2022-07-17 LAB — HEMOGLOBIN A1C
Hgb A1c MFr Bld: 11.1 % — ABNORMAL HIGH (ref 4.8–5.6)
Mean Plasma Glucose: 271.87 mg/dL

## 2022-07-17 LAB — URINALYSIS, ROUTINE W REFLEX MICROSCOPIC
Bilirubin Urine: NEGATIVE
Glucose, UA: >=500 mg/dL — AB
Ketones, ur: NEGATIVE mg/dL
Nitrite: NEGATIVE
Protein, ur: NEGATIVE mg/dL
Specific Gravity, Urine: >1.046 — ABNORMAL HIGH (ref 1.005–1.030)
pH: 5 (ref 5.0–8.0)

## 2022-07-17 LAB — CBG MONITORING, ED
Glucose-Capillary: 255 mg/dL — ABNORMAL HIGH (ref 70–99)
Glucose-Capillary: 210 mg/dL — ABNORMAL HIGH (ref 70–99)

## 2022-07-17 LAB — COMPREHENSIVE METABOLIC PANEL
ALT: 19 U/L (ref 0–44)
AST: 23 U/L (ref 15–41)
Albumin: 3.8 g/dL (ref 3.5–5.0)
Alkaline Phosphatase: 102 U/L (ref 38–126)
Anion gap: 15 (ref 5–15)
BUN: 12 mg/dL (ref 6–20)
CO2: 22 mmol/L (ref 22–32)
Calcium: 9.6 mg/dL (ref 8.9–10.3)
Chloride: 103 mmol/L (ref 98–111)
Creatinine, Ser: 0.64 mg/dL (ref 0.44–1.00)
GFR, Estimated: >60 mL/min (ref >60)
Glucose, Bld: 352 mg/dL — ABNORMAL HIGH (ref 70–99)
Potassium: 4 mmol/L (ref 3.5–5.1)
Sodium: 140 mmol/L (ref 135–145)
Total Bilirubin: 0.5 mg/dL (ref 0.3–1.2)
Total Protein: 7.4 g/dL (ref 6.5–8.1)

## 2022-07-17 LAB — CBC
HCT: 44.2 % (ref 36.0–46.0)
Hemoglobin: 15.4 g/dL — ABNORMAL HIGH (ref 12.0–15.0)
MCH: 31.3 pg (ref 26.0–34.0)
MCHC: 34.8 g/dL (ref 30.0–36.0)
MCV: 89.8 fL (ref 80.0–100.0)
Platelets: 399 10*3/uL (ref 150–400)
RBC: 4.92 MIL/uL (ref 3.87–5.11)
RDW: 12 % (ref 11.5–15.5)
WBC: 8 10*3/uL (ref 4.0–10.5)
nRBC: 0 % (ref 0.0–0.2)

## 2022-07-17 LAB — I-STAT BETA HCG BLOOD, ED (MC, WL, AP ONLY): I-stat hCG, quantitative: <5 m[IU]/mL (ref <5)

## 2022-07-17 LAB — LIPASE, BLOOD: Lipase: 28 U/L (ref 11–51)

## 2022-07-17 MED ORDER — OXYCODONE-ACETAMINOPHEN 5-325 MG PO TABS: 1.0000 | ORAL_TABLET | Freq: Once | ORAL | Status: DC

## 2022-07-17 MED ORDER — IOHEXOL 350 MG/ML SOLN
75.0000 mL | Freq: Once | INTRAVENOUS | Status: AC | PRN
  Administered 2022-07-17: 75 mL via INTRAVENOUS

## 2022-07-17 MED ORDER — LACTATED RINGERS IV BOLUS
1000.0000 mL | Freq: Once | INTRAVENOUS | Status: AC
  Administered 2022-07-17: 1000 mL via INTRAVENOUS

## 2022-07-17 MED ORDER — ACETAMINOPHEN 325 MG PO TABS
650.0000 mg | ORAL_TABLET | Freq: Four times a day (QID) | ORAL | Status: DC | PRN
  Administered 2022-07-17: 650 mg via ORAL
  Filled 2022-07-17: qty 2

## 2022-07-17 MED ORDER — FENTANYL CITRATE PF 50 MCG/ML IJ SOSY
50.0000 ug | PREFILLED_SYRINGE | Freq: Once | INTRAMUSCULAR | Status: AC
  Administered 2022-07-17: 50 ug via INTRAVENOUS
  Filled 2022-07-17: qty 1

## 2022-07-17 MED ORDER — INSULIN ASPART 100 UNIT/ML IJ SOLN
5.0000 [IU] | Freq: Once | INTRAMUSCULAR | Status: AC
  Administered 2022-07-17: 5 [IU] via SUBCUTANEOUS

## 2022-07-17 MED ORDER — ONDANSETRON HCL 4 MG/2ML IJ SOLN
4.0000 mg | Freq: Once | INTRAMUSCULAR | Status: AC
  Administered 2022-07-17: 4 mg via INTRAVENOUS
  Filled 2022-07-17: qty 2

## 2022-07-17 MED ORDER — INSULIN ASPART 100 UNIT/ML IJ SOLN
0.0000 [IU] | Freq: Three times a day (TID) | INTRAMUSCULAR | Status: DC
  Administered 2022-07-18: 2 [IU] via SUBCUTANEOUS
  Administered 2022-07-18: 1 [IU] via SUBCUTANEOUS
  Administered 2022-07-19: 3 [IU] via SUBCUTANEOUS
  Administered 2022-07-19: 4 [IU] via SUBCUTANEOUS

## 2022-07-17 MED ORDER — SODIUM CHLORIDE 0.9 % IV SOLN
2.0000 g | Freq: Once | INTRAVENOUS | Status: AC
  Administered 2022-07-17: 2 g via INTRAVENOUS
  Filled 2022-07-17: qty 20

## 2022-07-17 NOTE — Consult Note (Signed)
Reason for Consult:gallbladder Referring Physician: Rancour  Shawna [name] is an 36 y.o. female.  HPI:  Pt is a 36 yo F with several days of RUQ abdominal pain.  She came to the ED last night and left without being seen due to long wait.  She did have u/s last night showing gallstones.  She came back with persistent pain.  CT was performed and showed faint pericholecystic edema /fluid.  No ductal dilation seen.  LFTs and lipase were normal.   Glucose was 352 on CMET.    Pain has been going on around 3 days. This is associated with n/v and some diarrhea.  Pain is worse with food.  No fever/chills.     Past Medical History:  Diagnosis Date   Diabetes mellitus without complication ([place])    Gallstone    Gestational diabetes    History of gestational diabetes 05/06/2018   Failed 1 hr and 3 hr glucola Current Diabetic Medications:  Insulin  [x]  Aspirin 81 mg daily after 12 weeks (? A2/B GDM)   For A2/B GDM or higher classes of DM [x]  Diabetes Education and Testing Supplies [x]  Nutrition Counsult   Baseline and surveillance labs (pulled in from [facility], refresh links as needed)  Lab Results Component Value Date  CREATININE 0.55 07/25/2015  AST 31 07/25/2015  ALT 6 11/0    Past Surgical History:  Procedure Laterality Date   NO PAST SURGERIES      Family History  Problem Relation Age of Onset   Diabetes Mother    Cancer Mother    Heart disease Father    Hypertension Father   Sister had gallbladder disease.    Social History:  reports that she has never smoked. She has never used smokeless tobacco. She reports that she does not drink alcohol and does not use drugs.  Allergies: No Known Allergies  Medications:  Current Meds  Medication Sig   CALCIUM PO Take 1 tablet by mouth daily.   ibuprofen (ADVIL) 400 MG tablet Take 800 mg by mouth every 6 (six) hours as needed for mild pain or moderate pain.     Results for orders placed or performed during the hospital encounter of 07/17/22  (from the past 48 hour(s))  Urinalysis, Routine w reflex microscopic Urine, Clean Catch     Status: Abnormal   Collection Time: 07/17/22  4:07 PM  Result Value Ref Range   Color, Urine YELLOW YELLOW   APPearance CLEAR CLEAR   Specific Gravity, Urine >1.046 (H) 1.005 - 1.030   pH 5.0 5.0 - 8.0   Glucose, UA >=500 (A) NEGATIVE mg/dL   Hgb urine dipstick LARGE (A) NEGATIVE   Bilirubin Urine NEGATIVE NEGATIVE   Ketones, ur NEGATIVE NEGATIVE mg/dL   Protein, ur NEGATIVE NEGATIVE mg/dL   Nitrite NEGATIVE NEGATIVE   Leukocytes,Ua SMALL (A) NEGATIVE   RBC / HPF 0-5 0 - 5 RBC/hpf   WBC, UA 11-20 0 - 5 WBC/hpf   Bacteria, UA RARE (A) NONE SEEN   Squamous Epithelial / LPF 0-5 0 - 5    Comment: Performed at [facility], [address] [place] [address]  CBC     Status: Abnormal   Collection Time: 07/17/22  5:11 PM  Result Value Ref Range   WBC 8.0 4.0 - 10.5 K/uL   RBC 4.92 3.87 - 5.11 MIL/uL   Hemoglobin 15.4 (H) 12.0 - 15.0 g/dL   HCT 44.2 36.0 - 46.0 %   MCV 89.8  80.0 - 100.0 fL   MCH 31.3 26.0 - 34.0 pg   MCHC 34.8 30.0 - 36.0 g/dL   RDW 05.3 97.6 - 73.4 %   Platelets 399 150 - 400 K/uL   nRBC 0.0 0.0 - 0.2 %    Comment: Performed at [facility], [address], [place], [place] [address]  Comprehensive metabolic panel     Status: Abnormal   Collection Time: 07/17/22  5:11 PM  Result Value Ref Range   Sodium 140 135 - 145 mmol/L   Potassium 4.0 3.5 - 5.1 mmol/L   Chloride 103 98 - 111 mmol/L   CO2 22 22 - 32 mmol/L   Glucose, Bld 352 (H) 70 - 99 mg/dL    Comment: Glucose reference range applies only to samples taken after fasting for at least 8 hours.   BUN 12 6 - 20 mg/dL   Creatinine, Ser 0.24 0.44 - 1.00 mg/dL   Calcium 9.6 8.9 - 09.7 mg/dL   Total Protein 7.4 6.5 - 8.1 g/dL   Albumin 3.8 3.5 - 5.0 g/dL   AST 23 15 - 41 U/L   ALT 19 0 - 44 U/L   Alkaline Phosphatase 102 38 - 126 U/L   Total Bilirubin 0.5 0.3 - 1.2 mg/dL   GFR, Estimated >35 >32  mL/min    Comment: (NOTE) Calculated using the CKD-EPI Creatinine Equation (2021)    Anion gap 15 5 - 15    Comment: Performed at [facility], [address] [place], [place] [address]  Lipase, blood     Status: None   Collection Time: 07/17/22  5:11 PM  Result Value Ref Range   Lipase 28 11 - 51 U/L    Comment: Performed at [facility], [address] [place], [place] [address]  I-Stat Beta hCG blood, ED ([facility], [facility], [facility] only)     Status: None   Collection Time: 07/17/22  5:16 PM  Result Value Ref Range   I-stat hCG, quantitative <5.0 <5 mIU/mL   Comment 3            Comment:   GEST. AGE      CONC.  (mIU/mL)   <=1 WEEK        5 - 50     2 WEEKS       50 - 500     3 WEEKS       100 - 10,000     4 WEEKS     1,000 - 30,000        FEMALE AND NON-PREGNANT FEMALE:     LESS THAN 5 mIU/mL   CBG monitoring, ED     Status: Abnormal   Collection Time: 07/17/22  8:46 PM  Result Value Ref Range   Glucose-Capillary 255 (H) 70 - 99 mg/dL    Comment: Glucose reference range applies only to samples taken after fasting for at least 8 hours.    CT ABDOMEN PELVIS W CONTRAST  Result Date: 07/17/2022 CLINICAL DATA:  Right abdominal pain, nausea and vomiting. No subjective fevers. EXAM: CT ABDOMEN AND PELVIS WITH CONTRAST TECHNIQUE: Multidetector CT imaging of the abdomen and pelvis was performed using the standard protocol following bolus administration of intravenous contrast. RADIATION DOSE REDUCTION: This exam was performed according to the departmental dose-optimization program which includes automated exposure control, adjustment of the mA and/or kV according to patient size and/or use of iterative reconstruction technique. CONTRAST:  56mL OMNIPAQUE IOHEXOL 350 MG/ML  SOLN COMPARISON:  Right upper quadrant ultrasound yesterday, showing cholelithiasis without findings of acute cholecystitis. CT with IV contrast 01/05/2014. FINDINGS: Lower chest: No acute abnormality. Calcified granuloma  again noted right lower lobe lateral base. Hepatobiliary: The tiny layering stones in the gallbladder noted on yesterday's ultrasound are not visible by CT. The gallbladder has become slightly thickened with faint pericholecystic edema and trace pericholecystic fluid worrisome for acute cholecystitis. There is no biliary dilatation. Liver is 18 cm length mildly steatotic without mass. Pancreas: No focal abnormality. Spleen: No focal abnormality or splenomegaly. Adrenals/Urinary Tract: There is no adrenal or renal cortical mass enhancement. No urinary stones or obstruction. The bladder wall and lumen are unremarkable. Stomach/Bowel: No dilatation or wall thickening including of the appendix. There are scattered colonic diverticula without evidence of diverticulitis. Mild constipation ascending, transverse colon. No distal ileal fecal back up. Vascular/Lymphatic: No significant vascular findings are present. No enlarged abdominal or pelvic lymph nodes. Reproductive: Uterus and bilateral adnexa are unremarkable. Other: No pelvic free fluid is seen. No abdominal or pelvic free hemorrhage, free air or abscess. There is no incarcerated hernia. Musculoskeletal: Chronic bilateral non erosive sacroiliitis is again noted and slightly greater on the right, but unchanged. There is mild spondylosis. IMPRESSION: 1. CT findings worrisome for acute cholecystitis. Tiny layering stones were noted in the gallbladder on ultrasound but are not visible with CT. Surgical consult recommended. 2. Mild hepatic steatosis. 3. Constipation and diverticulosis. 4. Chronic nonerosive sacroiliitis. Electronically Signed   By: Almira Bar M.D.   On: 07/17/2022 20:09   US Abdomen Limited RUQ (LIVER/GB)  Result Date: 07/16/2022 CLINICAL DATA:  Right upper quadrant pain EXAM: ULTRASOUND ABDOMEN LIMITED RIGHT UPPER QUADRANT COMPARISON:  None Available. FINDINGS: Gallbladder: Small gallstones are present. No gallbladder wall thickening or  pericholecystic fluid. No sonographic Murphy sign noted by sonographer. Common bile duct: Diameter: 3.3 mm Liver: No focal lesion identified. Within normal limits in parenchymal echogenicity. Portal vein is patent on color Doppler imaging with normal direction of blood flow towards the liver. Other: None. IMPRESSION: Cholelithiasis without sonographic evidence of acute cholecystitis. Electronically Signed   By: Darliss Cheney M.D.   On: 07/16/2022 22:56    Review of Systems  All other systems reviewed and are negative.  Blood pressure 127/88, pulse 64, temperature 98.2 F (36.8 C), temperature source Oral, resp. rate 16, height 5\' 4"  (1.626 m), weight 108.9 kg, last menstrual period 06/13/2022, SpO2 100 %, unknown if currently breastfeeding. Physical Exam Vitals reviewed.  Constitutional:      General: She is in acute distress (mild).     Appearance: She is well-developed.  HENT:     Head: Normocephalic and atraumatic.     Mouth/Throat:     Mouth: Mucous membranes are moist.  Eyes:     General: No scleral icterus.    Extraocular Movements: Extraocular movements intact.     Pupils: Pupils are equal, round, and reactive to light.  Cardiovascular:     Rate and Rhythm: Normal rate and regular rhythm.  Pulmonary:     Effort: Pulmonary effort is normal. No respiratory distress.     Breath sounds: No stridor.  Abdominal:     General: Abdomen is flat. There is no distension. There are no signs of injury.     Palpations: Abdomen is soft. There is no shifting dullness, fluid wave, hepatomegaly or splenomegaly.     Tenderness: There is abdominal tenderness in the right upper quadrant. There is no guarding or rebound. Positive signs  include Murphy's sign.  Skin:    General: Skin is warm and dry.     Capillary Refill: Capillary refill takes 2 to 3 seconds.     Coloration: Skin is not cyanotic, jaundiced, mottled or pale.     Findings: No erythema or rash.  Neurological:     Mental Status: She is  alert.     Assessment/Plan:  Early acute calculous cholecystitis Hyperglycemia- likely undiagnosed DM.   Recommend medicine admit due to profound hyperglycemia. Pain/nausea control.  Possible surgery tomorrow if blood sugars controlled.     Shawna [name] 07/17/2022, 9:37 PM

## 2022-07-17 NOTE — ED Notes (Signed)
Pt reporting 7/10 pain, admitting team made aware.

## 2022-07-17 NOTE — ED Triage Notes (Addendum)
Pt reports right sided abd pain/vomiting/diarrhea onset Monday. Hx of gallstones. She reports she was here yesterday for the same complaint but left without being seen due to prolonged wait times. Denies fever. Reports hx of gallstones.

## 2022-07-17 NOTE — ED Notes (Signed)
Patient seen walking out from waiting area by NT.

## 2022-07-17 NOTE — ED Notes (Signed)
Patient called for vitals x3 no response 

## 2022-07-17 NOTE — Assessment & Plan Note (Addendum)
A1c 11.1. Has hx of gestational diabetes that she was on insulin for. Minimal PO due to surgery, glucose is downtrending. Monitor as PO improves. - SSI  - CBG monitoring - TOC consult. Pt has PCP f/u.

## 2022-07-17 NOTE — ED Provider Notes (Signed)
[facility] EMERGENCY DEPARTMENT Provider Note   CSN: [id number] Arrival date & time: 07/17/22  1607     History  Chief Complaint  Patient presents with   Abdominal Pain    ANEISHA SKYLES is a 36 y.o. female.  Patient with 3 days of right-sided abdominal pain with nausea and vomiting and diarrhea.  Does have a history of gallstones.  Seen for the same yesterday but left without being seen.  Reports constant pain to her right upper quadrant worse with eating.  2 episodes of vomiting today.  Some loose stools as well.  No fever.  No pain with urination or blood in the urine.  No chest pain or shortness of breath.  Known history of gallstones.  Denies possibility of pregnancy.  The history is provided by the patient.  Abdominal Pain Associated symptoms: diarrhea, nausea and vomiting   Associated symptoms: no cough, no dysuria, no fever and no shortness of breath        Home Medications Prior to Admission medications   Medication Sig Start Date End Date Taking? Authorizing Provider  acetaminophen (TYLENOL) 325 MG tablet Take 2 tablets (650 mg total) by mouth every 4 (four) hours as needed (for pain scale < 4). Patient not taking: Reported on 11/20/2018 10/24/18   Standley Brooking, DO  ibuprofen (ADVIL,MOTRIN) 600 MG tablet Take 1 tablet (600 mg total) by mouth every 6 (six) hours. Patient not taking: Reported on 11/20/2018 10/24/18   Standley Brooking, DO  Prenatal Vit-Fe Fumarate-FA (PRENATAL MULTIVITAMIN) TABS tablet Take 1 tablet by mouth daily at 12 noon.    [provider]  senna-docusate (SENOKOT-S) 8.6-50 MG tablet Take 2 tablets by mouth daily. Patient not taking: Reported on 11/20/2018 10/25/18   Standley Brooking, DO      Allergies    Patient has no known allergies.    Review of Systems   Review of Systems  Constitutional:  Positive for activity change and appetite change. Negative for fever.  HENT:  Negative for congestion and  rhinorrhea.   Respiratory:  Negative for cough and shortness of breath.   Gastrointestinal:  Positive for abdominal pain, diarrhea, nausea and vomiting.  Genitourinary:  Negative for dysuria.  Musculoskeletal:  Negative for arthralgias and myalgias.  Skin:  Negative for rash.  Neurological:  Negative for weakness, light-headedness and headaches.   all other systems are negative except as noted in the HPI and PMH.    Physical Exam Updated Vital Signs BP (!) 128/90 (BP Location: Right Arm)   Pulse 70   Temp 98.1 F (36.7 C) (Oral)   Resp 16   Ht 5\' 4"  (1.626 m)   Wt 108.9 kg   LMP 06/13/2022   SpO2 98%   BMI 41.20 kg/m  Physical Exam Vitals and nursing note reviewed.  Constitutional:      General: She is not in acute distress.    Appearance: She is well-developed.  HENT:     Head: Normocephalic and atraumatic.     Mouth/Throat:     Pharynx: No oropharyngeal exudate.  Eyes:     Conjunctiva/sclera: Conjunctivae normal.     Pupils: Pupils are equal, round, and reactive to light.  Neck:     Comments: No meningismus. Cardiovascular:     Rate and Rhythm: Normal rate and regular rhythm.     Heart sounds: Normal heart sounds. No murmur heard. Pulmonary:     Effort: Pulmonary effort is normal. No respiratory distress.  Breath sounds: Normal breath sounds.  Abdominal:     General: There is no distension.     Palpations: Abdomen is soft.     Tenderness: There is abdominal tenderness. There is no guarding or rebound.     Comments: Epigastric and right upper quadrant tenderness, no guarding or rebound  Musculoskeletal:        General: No tenderness. Normal range of motion.     Cervical back: Normal range of motion and neck supple.  Skin:    General: Skin is warm.  Neurological:     Mental Status: She is alert and oriented to person, place, and time.     Cranial Nerves: No cranial nerve deficit.     Motor: No abnormal muscle tone.     Coordination: Coordination normal.      Comments:  5/5 strength throughout. CN 2-12 intact.Equal grip strength.   Psychiatric:        Behavior: Behavior normal.     ED Results / Procedures / Treatments   Labs (all labs ordered are listed, but only abnormal results are displayed) Labs Reviewed  CBC - Abnormal; Notable for the following components:      Result Value   Hemoglobin 15.4 (*)    All other components within normal limits  COMPREHENSIVE METABOLIC PANEL - Abnormal; Notable for the following components:   Glucose, Bld 352 (*)    All other components within normal limits  LIPASE, BLOOD  URINALYSIS, ROUTINE W REFLEX MICROSCOPIC  I-STAT BETA HCG BLOOD, ED ([facility], [facility], [facility] ONLY)  CBG MONITORING, ED    EKG None  Radiology CT ABDOMEN PELVIS W CONTRAST  Result Date: 07/17/2022 CLINICAL DATA:  Right abdominal pain, nausea and vomiting. No subjective fevers. EXAM: CT ABDOMEN AND PELVIS WITH CONTRAST TECHNIQUE: Multidetector CT imaging of the abdomen and pelvis was performed using the standard protocol following bolus administration of intravenous contrast. RADIATION DOSE REDUCTION: This exam was performed according to the departmental dose-optimization program which includes automated exposure control, adjustment of the mA and/or kV according to patient size and/or use of iterative reconstruction technique. CONTRAST:  26mL OMNIPAQUE IOHEXOL 350 MG/ML SOLN COMPARISON:  Right upper quadrant ultrasound yesterday, showing cholelithiasis without findings of acute cholecystitis. CT with IV contrast 01/05/2014. FINDINGS: Lower chest: No acute abnormality. Calcified granuloma again noted right lower lobe lateral base. Hepatobiliary: The tiny layering stones in the gallbladder noted on yesterday's ultrasound are not visible by CT. The gallbladder has become slightly thickened with faint pericholecystic edema and trace pericholecystic fluid worrisome for acute cholecystitis. There is no biliary dilatation. Liver is 18 cm length mildly  steatotic without mass. Pancreas: No focal abnormality. Spleen: No focal abnormality or splenomegaly. Adrenals/Urinary Tract: There is no adrenal or renal cortical mass enhancement. No urinary stones or obstruction. The bladder wall and lumen are unremarkable. Stomach/Bowel: No dilatation or wall thickening including of the appendix. There are scattered colonic diverticula without evidence of diverticulitis. Mild constipation ascending, transverse colon. No distal ileal fecal back up. Vascular/Lymphatic: No significant vascular findings are present. No enlarged abdominal or pelvic lymph nodes. Reproductive: Uterus and bilateral adnexa are unremarkable. Other: No pelvic free fluid is seen. No abdominal or pelvic free hemorrhage, free air or abscess. There is no incarcerated hernia. Musculoskeletal: Chronic bilateral non erosive sacroiliitis is again noted and slightly greater on the right, but unchanged. There is mild spondylosis. IMPRESSION: 1. CT findings worrisome for acute cholecystitis. Tiny layering stones were noted in the gallbladder on ultrasound but are not visible  with CT. Surgical consult recommended. 2. Mild hepatic steatosis. 3. Constipation and diverticulosis. 4. Chronic nonerosive sacroiliitis. Electronically Signed   By: Almira Bar M.D.   On: 07/17/2022 20:09   US Abdomen Limited RUQ (LIVER/GB)  Result Date: 07/16/2022 CLINICAL DATA:  Right upper quadrant pain EXAM: ULTRASOUND ABDOMEN LIMITED RIGHT UPPER QUADRANT COMPARISON:  None Available. FINDINGS: Gallbladder: Small gallstones are present. No gallbladder wall thickening or pericholecystic fluid. No sonographic Murphy sign noted by sonographer. Common bile duct: Diameter: 3.3 mm Liver: No focal lesion identified. Within normal limits in parenchymal echogenicity. Portal vein is patent on color Doppler imaging with normal direction of blood flow towards the liver. Other: None. IMPRESSION: Cholelithiasis without sonographic evidence of acute  cholecystitis. Electronically Signed   By: Darliss Cheney M.D.   On: 07/16/2022 22:56    Procedures Procedures    Medications Ordered in ED Medications  lactated ringers bolus 1,000 mL (has no administration in time range)  ondansetron (ZOFRAN) injection 4 mg (has no administration in time range)  fentaNYL (SUBLIMAZE) injection 50 mcg (has no administration in time range)    ED Course/ Medical Decision Making/ A&P                           Medical Decision Making Amount and/or Complexity of Data Reviewed Labs: ordered. Decision-making details documented in ED Course. Radiology: ordered and independent interpretation performed. Decision-making details documented in ED Course. ECG/medicine tests: ordered and independent interpretation performed. Decision-making details documented in ED Course.  Risk Prescription drug management.   Upper abdominal pain for the past 3 days with nausea vomiting and diarrhea.  Abdomen soft without peritoneal signs.  Vital stable, no distress no fever.  Ultrasound yesterday shows gallstones without evidence of cholecystitis.  No leukocytosis.  LFTs and lipase are normal  Imaging is remarkable for suspected cholecystitis.  Discussed with Dr. Donell Beers of general surgery.  She will consult.  Agrees with antibiotics and IV fluids.  Recommends medical admission given hyperglycemia.  Patient denies history of diabetes  IV fluids and insulin given.  Hyperglycemia without DKA.  Admission to family practice service discussed with residents.       Final Clinical Impression(s) / ED Diagnoses Final diagnoses:  None    Rx / DC Orders ED Discharge Orders     None         Yusuf Yu, Jeannett Senior, MD 07/17/22 2044

## 2022-07-17 NOTE — Assessment & Plan Note (Addendum)
S/p lap chole yesterday. Passing gas, but no BM yet. Tolerating PO. Pain is controlled on current regimen. - Pain Control  - Tylenol sch q6h  - Oxy prn 1st line, Dilaudid prn 2nd line for breakthrough - Miralax for bowel reg - Another dose CTX for today (per Surgery recs) - Restart Lovenox today at 10am

## 2022-07-17 NOTE — ED Notes (Signed)
Patient transported to CT 

## 2022-07-17 NOTE — ED Provider Triage Note (Signed)
Emergency Medicine Provider Triage Evaluation Note  Shawna [name] , a 36 y.o. female  was evaluated in triage.  Pt complains of patient complains of right upper quadrant pain, nausea, vomiting and diarrhea.  Patient reports has history of gallstones and this feels similar.  Patient was seen last night in this ED however left due to wait time.  Review of Systems  Positive:  Negative:   Physical Exam  BP (!) 157/111 (BP Location: Right Arm)   Pulse 89   Temp 98.1 F (36.7 C) (Oral)   Resp 16   Ht 5\' 4"  (1.626 m)   Wt 108.9 kg   LMP 06/13/2022   SpO2 97%   BMI 41.20 kg/m  Gen:   Awake, no distress   Resp:  Normal effort  MSK:   Moves extremities without difficulty  Other:  Right upper quadrant tenderness  Medical Decision Making  Medically screening exam initiated at 4:43 PM.  Appropriate orders placed.  Shawna [name] was informed that the remainder of the evaluation will be completed by another provider, this initial triage assessment does not replace that evaluation, and the importance of remaining in the ED until their evaluation is complete.     Azucena Cecil, PA-C 07/17/22 1643

## 2022-07-17 NOTE — H&P (Cosign Needed Addendum)
Hospital Admission History and Physical Service Pager: [phone]  Patient name: Shawna [name] Medical record number: [id number] Date of Birth: 1986/05/01 Age: 36 y.o. Gender: female  Primary Care Provider: Patient, No Pcp Per Consultants: Gen Surg in ED Code Status: Full Preferred Emergency Contact:   Name Relation Home Work Mobile   Alma Significant other [phone]     Chief Complaint: Right-sided abdominal pain with n/v/d  Assessment and Plan: Shawna [name] is a 36 y.o. female presenting with right-sided abdominal pain with n/v/d. Differential for this patient's presentation of this includes acute cholecystitis (most likely given history of pain and symptoms after fatty meal, exam on presentation, and imaging findings of acute cholecystitis on CT), GERD (less likely given above factors though presence of epigastric pain and onset after fatty meal could be consistent), constipation (less likely given above factors though she does endorse few stools since onset of pain), and ACS (less likely given above factors though could present atypically in females).  * Cholecystitis VSS. Exam unremarkable, though she is s/p fentanyl. Labs without leukocytosis, lipase normal. Surgery has been consulted and recommended medical admission for hyperglycemia with anticipation of surgery in the AM.  - Admit to FMTS med-tele attending Dr. Ardelia Mems - F/u GS recommendations, NPO after MN for potential surgery - Pain control: will add tylenol 650 mg Q6H prn and consider additional doses of fentanyl if pain uncontrolled - Obtain EKG and redose antiemetics as needed - Monitor fluid status while NPO - S/p 1 dose CTX, consider redosing tomorrow if appropriate  - Cardiac monitoring given pain - AM CBC, CMP - Vital signs per unit routine - SCDs, consider VTE ppx after surgery  Hyperglycemia BG 352 on admission CMP. UA with >500 glucose. Has been taking less PO since she has been n/v/d.  Does have a history of gestational diabetes and was following up with health department but not on medications. - vSSI for now given insulin naive, consider metformin at d/c - Hgb A1c - TOC consult if Hgb A1c with diabetes - CBG monitoring   Language Barrier Spanish speaking, requires interpreter.   FEN/GI: NPO VTE Prophylaxis: SCDs  Disposition: Med-tele  History of Present Illness:  Shawna [name] is a 36 y.o. female presenting with right-sided abdominal pain with n/v/d.  Patient reports that Monday at MN she was having a lot of pain around her gallbladder after she had eaten a big meal (pizza). She took Ibuprofen 400mg  (would take 2-3) and had some pain improvement. She felt like she was having some burning sensation in her pain. She came to the ER on Tuesday but left due to wait times. She is still feeling a burning sensation in her stomach, currently rating the pain as 4-5/10 (improved from 8/10 at home). She reports that she has had this pain before several years ago and previously would be able to take OTC medications for pain and it would go away and she would be fine. Was previously diagnosed with gallstones several years ago. Vomited twice this morning and had some abdominal discomfort for the last 2 days. Was last able to eat at 3pm. Denies any fevers, chest pain, shortness of breath  In the ED, her VSS with epigastric and RUQ tenderness without rebound. WBC normal. CMP with glucose 350s and normal LFTs. Normal lipase. Consulted General Surgery who will likely take for surgery tomorrow.  Review Of Systems: Per HPI.  Pertinent Past Medical History: Obesity  GDM Gallstones Remainder reviewed in history tab.  Pertinent Past Surgical History: None  Remainder reviewed in history tab.   Pertinent Social History: Tobacco use: No Alcohol use: rarely for events a few times a year Other Substance use: none Lives with significant other and 4 children in  [place]  Pertinent Family History: Mother - cancer and diabetes Father - HTN and heart disease Remainder reviewed in history tab.   Important Outpatient Medications: OTC tylenol and ibuprofen Remainder reviewed in medication history.   Objective: BP 127/88   Pulse 64   Temp 98.2 F (36.8 C) (Oral)   Resp 16   Ht 5\' 4"  (1.626 m)   Wt 108.9 kg   LMP 06/13/2022   SpO2 100%   BMI 41.20 kg/m  Exam: General: Alert and oriented, in NAD Skin: Warm, dry, and intact HEENT: NCAT, EOM grossly normal, midline nasal septum Cardiac: RRR, no m/r/g appreciated Respiratory: CTAB anteriorly, breathing and speaking comfortably on RA Abdominal: Soft, mildly tender to palpation on our exam while distracted, nondistended, normoactive bowel sounds, no peritoneal signs Extremities: Moves all extremities grossly equally in bed Neurological: No gross focal deficit Psychiatric: Appropriate mood and affect   Labs:  CBC BMET  Recent Labs  Lab 07/17/22 1711  WBC 8.0  HGB 15.4*  HCT 44.2  PLT 399   Recent Labs  Lab 07/17/22 1711  NA 140  K 4.0  CL 103  CO2 22  BUN 12  CREATININE 0.64  GLUCOSE 352*  CALCIUM 9.6     Lipase normal at 28  Imaging Studies Performed:  CTAP IMPRESSION: 1. CT findings worrisome for acute cholecystitis. Tiny layering stones were noted in the gallbladder on ultrasound but are not visible with CT. Surgical consult recommended. 2. Mild hepatic steatosis. 3. Constipation and diverticulosis. 4. Chronic nonerosive sacroiliitis.  Ethelene Hal, MD 07/17/2022, 9:46 PM PGY-1, [place] Intern pager: [phone], text pages welcome Secure chat group [place] Upper-Level Resident Addendum   I have independently interviewed and examined the patient. I have discussed the above with the original author and agree with their documentation. My edits for correction/addition/clarification are in  within the document. Please see also any attending notes.   Rise Patience, DO  PGY-3, [facility] 07/17/2022 9:51 PM  FPTS Service pager: [phone] (text pages welcome through [facility])

## 2022-07-18 ENCOUNTER — Observation Stay (HOSPITAL_COMMUNITY): Payer: Self-pay | Admitting: Anesthesiology

## 2022-07-18 ENCOUNTER — Observation Stay (HOSPITAL_BASED_OUTPATIENT_CLINIC_OR_DEPARTMENT_OTHER): Payer: Self-pay | Admitting: Anesthesiology

## 2022-07-18 ENCOUNTER — Encounter (HOSPITAL_COMMUNITY): Payer: Self-pay | Admitting: Family Medicine

## 2022-07-18 ENCOUNTER — Other Ambulatory Visit: Payer: Self-pay

## 2022-07-18 ENCOUNTER — Encounter (HOSPITAL_COMMUNITY)
Admission: EM | Disposition: A | Discharge status: Home / Self Care | Payer: Self-pay | Source: Home / Self Care | Attending: Family Medicine

## 2022-07-18 DIAGNOSIS — Z9049 Acquired absence of other specified parts of digestive tract: Secondary | ICD-10-CM

## 2022-07-18 DIAGNOSIS — K810 Acute cholecystitis: Secondary | ICD-10-CM

## 2022-07-18 DIAGNOSIS — Z6841 Body Mass Index (BMI) 40.0 and over, adult: Secondary | ICD-10-CM

## 2022-07-18 DIAGNOSIS — E119 Type 2 diabetes mellitus without complications: Secondary | ICD-10-CM

## 2022-07-18 HISTORY — PX: CHOLECYSTECTOMY: SHX55

## 2022-07-18 LAB — GLUCOSE, CAPILLARY
Glucose-Capillary: 208 mg/dL — ABNORMAL HIGH (ref 70–99)
Glucose-Capillary: 224 mg/dL — ABNORMAL HIGH (ref 70–99)
Glucose-Capillary: 183 mg/dL — ABNORMAL HIGH (ref 70–99)
Glucose-Capillary: 167 mg/dL — ABNORMAL HIGH (ref 70–99)

## 2022-07-18 LAB — COMPREHENSIVE METABOLIC PANEL
ALT: 18 U/L (ref 0–44)
AST: 13 U/L — ABNORMAL LOW (ref 15–41)
Albumin: 3.4 g/dL — ABNORMAL LOW (ref 3.5–5.0)
Alkaline Phosphatase: 86 U/L (ref 38–126)
Anion gap: 11 (ref 5–15)
BUN: 11 mg/dL (ref 6–20)
CO2: 24 mmol/L (ref 22–32)
Calcium: 8.7 mg/dL — ABNORMAL LOW (ref 8.9–10.3)
Chloride: 102 mmol/L (ref 98–111)
Creatinine, Ser: 0.57 mg/dL (ref 0.44–1.00)
GFR, Estimated: >60 mL/min (ref >60)
Glucose, Bld: 232 mg/dL — ABNORMAL HIGH (ref 70–99)
Potassium: 3.6 mmol/L (ref 3.5–5.1)
Sodium: 137 mmol/L (ref 135–145)
Total Bilirubin: 0.5 mg/dL (ref 0.3–1.2)
Total Protein: 6.6 g/dL (ref 6.5–8.1)

## 2022-07-18 LAB — CBC
HCT: 38.9 % (ref 36.0–46.0)
Hemoglobin: 13.4 g/dL (ref 12.0–15.0)
MCH: 31.1 pg (ref 26.0–34.0)
MCHC: 34.4 g/dL (ref 30.0–36.0)
MCV: 90.3 fL (ref 80.0–100.0)
Platelets: 364 10*3/uL (ref 150–400)
RBC: 4.31 MIL/uL (ref 3.87–5.11)
RDW: 12 % (ref 11.5–15.5)
WBC: 8.6 10*3/uL (ref 4.0–10.5)
nRBC: 0 % (ref 0.0–0.2)

## 2022-07-18 LAB — HIV ANTIBODY (ROUTINE TESTING W REFLEX): HIV Screen 4th Generation wRfx: NONREACTIVE

## 2022-07-18 SURGERY — LAPAROSCOPIC CHOLECYSTECTOMY
Anesthesia: General | Site: Abdomen

## 2022-07-18 MED ORDER — CHLORHEXIDINE GLUCONATE CLOTH 2 % EX PADS
6.0000 | MEDICATED_PAD | Freq: Once | CUTANEOUS | Status: AC
  Administered 2022-07-18: 6 via TOPICAL

## 2022-07-18 MED ORDER — 0.9 % SODIUM CHLORIDE (POUR BTL) OPTIME
TOPICAL | Status: DC | PRN
  Administered 2022-07-18: 1000 mL

## 2022-07-18 MED ORDER — ONDANSETRON HCL 4 MG/2ML IJ SOLN
INTRAMUSCULAR | Status: DC | PRN
  Administered 2022-07-18: 4 mg via INTRAVENOUS

## 2022-07-18 MED ORDER — ACETAMINOPHEN 500 MG PO TABS
1000.0000 mg | ORAL_TABLET | Freq: Four times a day (QID) | ORAL | Status: DC | PRN
  Administered 2022-07-18 – 2022-07-19 (×3): 1000 mg via ORAL
  Filled 2022-07-18 (×3): qty 2

## 2022-07-18 MED ORDER — DIPHENHYDRAMINE HCL 50 MG/ML IJ SOLN
INTRAMUSCULAR | Status: AC
  Filled 2022-07-18: qty 1

## 2022-07-18 MED ORDER — MIDAZOLAM HCL 2 MG/2ML IJ SOLN: 0.5000 mg | Freq: Once | INTRAMUSCULAR | Status: DC | PRN

## 2022-07-18 MED ORDER — FENTANYL CITRATE PF 50 MCG/ML IJ SOSY: 50.0000 ug | PREFILLED_SYRINGE | Freq: Once | INTRAMUSCULAR | Status: DC

## 2022-07-18 MED ORDER — HYDROMORPHONE HCL 1 MG/ML IJ SOLN
0.5000 mg | INTRAMUSCULAR | Status: DC | PRN
  Administered 2022-07-18 – 2022-07-19 (×3): 0.5 mg via INTRAVENOUS
  Filled 2022-07-18 (×3): qty 0.5

## 2022-07-18 MED ORDER — MEPERIDINE HCL 25 MG/ML IJ SOLN: 6.2500 mg | INTRAMUSCULAR | Status: DC | PRN

## 2022-07-18 MED ORDER — SUCCINYLCHOLINE CHLORIDE 200 MG/10ML IV SOSY
PREFILLED_SYRINGE | INTRAVENOUS | Status: AC
  Filled 2022-07-18: qty 10

## 2022-07-18 MED ORDER — MIDAZOLAM HCL 2 MG/2ML IJ SOLN
INTRAMUSCULAR | Status: AC
  Filled 2022-07-18: qty 2

## 2022-07-18 MED ORDER — OXYCODONE HCL 5 MG PO TABS
5.0000 mg | ORAL_TABLET | ORAL | Status: DC | PRN
  Administered 2022-07-18: 5 mg via ORAL
  Administered 2022-07-19: 10 mg via ORAL
  Filled 2022-07-18: qty 2
  Filled 2022-07-18: qty 1

## 2022-07-18 MED ORDER — HYDROMORPHONE HCL 1 MG/ML IJ SOLN
INTRAMUSCULAR | Status: AC
  Filled 2022-07-18: qty 1

## 2022-07-18 MED ORDER — LACTATED RINGERS IV SOLN: INTRAVENOUS | Status: DC

## 2022-07-18 MED ORDER — PROPOFOL 10 MG/ML IV BOLUS
INTRAVENOUS | Status: AC
  Filled 2022-07-18: qty 20

## 2022-07-18 MED ORDER — ROCURONIUM BROMIDE 10 MG/ML (PF) SYRINGE
PREFILLED_SYRINGE | INTRAVENOUS | Status: AC
  Filled 2022-07-18: qty 10

## 2022-07-18 MED ORDER — OXYCODONE HCL 5 MG PO TABS: 5.0000 mg | ORAL_TABLET | Freq: Once | ORAL | Status: DC | PRN

## 2022-07-18 MED ORDER — MIDAZOLAM HCL 2 MG/2ML IJ SOLN
INTRAMUSCULAR | Status: DC | PRN
  Administered 2022-07-18: 2 mg via INTRAVENOUS

## 2022-07-18 MED ORDER — LIDOCAINE 2% (20 MG/ML) 5 ML SYRINGE
INTRAMUSCULAR | Status: DC | PRN
  Administered 2022-07-18: 40 mg via INTRAVENOUS

## 2022-07-18 MED ORDER — SODIUM CHLORIDE 0.9 % IV SOLN
2.0000 g | Freq: Once | INTRAVENOUS | Status: AC
  Administered 2022-07-19: 2 g via INTRAVENOUS
  Filled 2022-07-18: qty 20

## 2022-07-18 MED ORDER — SCOPOLAMINE 1 MG/3DAYS TD PT72
1.0000 | MEDICATED_PATCH | TRANSDERMAL | Status: DC
  Administered 2022-07-18: 1.5 mg via TRANSDERMAL
  Filled 2022-07-18: qty 1

## 2022-07-18 MED ORDER — SUGAMMADEX SODIUM 200 MG/2ML IV SOLN
INTRAVENOUS | Status: DC | PRN
  Administered 2022-07-18: 200 mg via INTRAVENOUS

## 2022-07-18 MED ORDER — DEXMEDETOMIDINE HCL IN NACL 80 MCG/20ML IV SOLN
INTRAVENOUS | Status: DC | PRN
  Administered 2022-07-18 (×2): 8 ug via BUCCAL

## 2022-07-18 MED ORDER — OXYCODONE HCL 5 MG/5ML PO SOLN: 5.0000 mg | Freq: Once | ORAL | Status: DC | PRN

## 2022-07-18 MED ORDER — SODIUM CHLORIDE 0.9 % IR SOLN
Status: DC | PRN
  Administered 2022-07-18: 1000 mL

## 2022-07-18 MED ORDER — LIDOCAINE 2% (20 MG/ML) 5 ML SYRINGE
INTRAMUSCULAR | Status: AC
  Filled 2022-07-18: qty 5

## 2022-07-18 MED ORDER — SUCCINYLCHOLINE CHLORIDE 200 MG/10ML IV SOSY
PREFILLED_SYRINGE | INTRAVENOUS | Status: DC | PRN
  Administered 2022-07-18: 140 mg via INTRAVENOUS

## 2022-07-18 MED ORDER — BUPIVACAINE-EPINEPHRINE 0.25% -1:200000 IJ SOLN
INTRAMUSCULAR | Status: DC | PRN
  Administered 2022-07-18: 26 mL

## 2022-07-18 MED ORDER — ORAL CARE MOUTH RINSE: 15.0000 mL | Freq: Once | OROMUCOSAL | Status: AC

## 2022-07-18 MED ORDER — PROPOFOL 10 MG/ML IV BOLUS
INTRAVENOUS | Status: DC | PRN
  Administered 2022-07-18: 150 mg via INTRAVENOUS

## 2022-07-18 MED ORDER — PROMETHAZINE HCL 25 MG/ML IJ SOLN: 6.2500 mg | INTRAMUSCULAR | Status: DC | PRN

## 2022-07-18 MED ORDER — CHLORHEXIDINE GLUCONATE CLOTH 2 % EX PADS: 6.0000 | MEDICATED_PAD | Freq: Once | CUTANEOUS | Status: DC

## 2022-07-18 MED ORDER — FENTANYL CITRATE (PF) 250 MCG/5ML IJ SOLN
INTRAMUSCULAR | Status: AC
  Filled 2022-07-18: qty 5

## 2022-07-18 MED ORDER — INSULIN ASPART 100 UNIT/ML IJ SOLN: 0.0000 [IU] | INTRAMUSCULAR | Status: DC | PRN

## 2022-07-18 MED ORDER — BUPIVACAINE-EPINEPHRINE (PF) 0.25% -1:200000 IJ SOLN
INTRAMUSCULAR | Status: AC
  Filled 2022-07-18: qty 30

## 2022-07-18 MED ORDER — FENTANYL CITRATE PF 50 MCG/ML IJ SOSY
50.0000 ug | PREFILLED_SYRINGE | INTRAMUSCULAR | Status: AC | PRN
  Administered 2022-07-18 (×3): 50 ug via INTRAVENOUS
  Filled 2022-07-18 (×3): qty 1

## 2022-07-18 MED ORDER — FENTANYL CITRATE (PF) 250 MCG/5ML IJ SOLN
INTRAMUSCULAR | Status: DC | PRN
  Administered 2022-07-18: 25 ug via INTRAVENOUS
  Administered 2022-07-18: 50 ug via INTRAVENOUS
  Administered 2022-07-18: 100 ug via INTRAVENOUS
  Administered 2022-07-18: 50 ug via INTRAVENOUS

## 2022-07-18 MED ORDER — ROCURONIUM BROMIDE 10 MG/ML (PF) SYRINGE
PREFILLED_SYRINGE | INTRAVENOUS | Status: DC | PRN
  Administered 2022-07-18: 50 mg via INTRAVENOUS

## 2022-07-18 MED ORDER — DIPHENHYDRAMINE HCL 50 MG/ML IJ SOLN
INTRAMUSCULAR | Status: DC | PRN
  Administered 2022-07-18: 12.5 mg via INTRAVENOUS

## 2022-07-18 MED ORDER — CHLORHEXIDINE GLUCONATE 0.12 % MT SOLN
15.0000 mL | Freq: Once | OROMUCOSAL | Status: AC
  Administered 2022-07-18: 15 mL via OROMUCOSAL

## 2022-07-18 MED ORDER — HYDROMORPHONE HCL 1 MG/ML IJ SOLN
0.2500 mg | INTRAMUSCULAR | Status: DC | PRN
  Administered 2022-07-18 (×4): 0.5 mg via INTRAVENOUS

## 2022-07-18 MED ORDER — ENOXAPARIN SODIUM 60 MG/0.6ML IJ SOSY: 0.5000 mg/kg | PREFILLED_SYRINGE | INTRAMUSCULAR | Status: DC

## 2022-07-18 MED ORDER — ONDANSETRON HCL 4 MG/2ML IJ SOLN
INTRAMUSCULAR | Status: AC
  Filled 2022-07-18: qty 2

## 2022-07-18 SURGICAL SUPPLY — 40 items
APPLIER CLIP 5 13 M/L LIGAMAX5 (MISCELLANEOUS) ×1
BAG COUNTER SPONGE SURGICOUNT (BAG) ×1 IMPLANT
BLADE CLIPPER SURG (BLADE) IMPLANT
CANISTER SUCT 3000ML PPV (MISCELLANEOUS) ×1 IMPLANT
CHLORAPREP W/TINT 26 (MISCELLANEOUS) ×1 IMPLANT
CLIP APPLIE 5 13 M/L LIGAMAX5 (MISCELLANEOUS) ×1 IMPLANT
COVER SURGICAL LIGHT HANDLE (MISCELLANEOUS) ×1 IMPLANT
DERMABOND ADVANCED .7 DNX12 (GAUZE/BANDAGES/DRESSINGS) ×1 IMPLANT
ELECT REM PT RETURN 9FT ADLT (ELECTROSURGICAL) ×1
ELECTRODE REM PT RTRN 9FT ADLT (ELECTROSURGICAL) ×1 IMPLANT
GLOVE BIO SURGEON STRL SZ8 (GLOVE) ×1 IMPLANT
GLOVE BIOGEL PI IND STRL 8 (GLOVE) ×1 IMPLANT
GOWN STRL REUS W/ TWL LRG LVL3 (GOWN DISPOSABLE) ×2 IMPLANT
GOWN STRL REUS W/ TWL XL LVL3 (GOWN DISPOSABLE) ×1 IMPLANT
GOWN STRL REUS W/TWL LRG LVL3 (GOWN DISPOSABLE) ×2
GOWN STRL REUS W/TWL XL LVL3 (GOWN DISPOSABLE) ×1
KIT BASIN OR (CUSTOM PROCEDURE TRAY) ×1 IMPLANT
KIT TURNOVER KIT B (KITS) ×1 IMPLANT
L-HOOK LAP DISP 36CM (ELECTROSURGICAL) ×1
LHOOK LAP DISP 36CM (ELECTROSURGICAL) ×1 IMPLANT
NDL 22X1.5 STRL (OR ONLY) (MISCELLANEOUS) ×1 IMPLANT
NEEDLE 22X1.5 STRL (OR ONLY) (MISCELLANEOUS) ×1 IMPLANT
NS IRRIG 1000ML POUR BTL (IV SOLUTION) ×1 IMPLANT
PAD ARMBOARD 7.5X6 YLW CONV (MISCELLANEOUS) ×1 IMPLANT
PENCIL BUTTON HOLSTER BLD 10FT (ELECTRODE) ×1 IMPLANT
POUCH RETRIEVAL ECOSAC 10 (ENDOMECHANICALS) ×1 IMPLANT
POUCH RETRIEVAL ECOSAC 10MM (ENDOMECHANICALS) ×1
SCISSORS LAP 5X35 DISP (ENDOMECHANICALS) ×1 IMPLANT
SET IRRIG TUBING LAPAROSCOPIC (IRRIGATION / IRRIGATOR) ×1 IMPLANT
SET TUBE SMOKE EVAC HIGH FLOW (TUBING) ×1 IMPLANT
SLEEVE Z-THREAD 5X100MM (TROCAR) IMPLANT
SPECIMEN JAR SMALL (MISCELLANEOUS) ×1 IMPLANT
SUT VIC AB 4-0 PS2 27 (SUTURE) ×1 IMPLANT
TOWEL GREEN STERILE (TOWEL DISPOSABLE) ×1 IMPLANT
TOWEL GREEN STERILE FF (TOWEL DISPOSABLE) ×1 IMPLANT
TRAY LAPAROSCOPIC MC (CUSTOM PROCEDURE TRAY) ×1 IMPLANT
TROCAR ADV FIXATION 12X100MM (TROCAR) IMPLANT
TROCAR Z-THREAD OPTICAL 5X100M (TROCAR) ×1 IMPLANT
WARMER LAPAROSCOPE (MISCELLANEOUS) ×1 IMPLANT
WATER STERILE IRR 1000ML POUR (IV SOLUTION) ×1 IMPLANT

## 2022-07-18 NOTE — Discharge Instructions (Addendum)
CIRUGIA LAPAROSCOPICA: INSTRUCCIONES DE POST OPERATORIO.  Revise siempre los documentos que le entreguen en el lugar donde se ha hecho la [place].  SI USTED NECESITA DOCUMENTOS DE INCAPACIDAD (DISABLE) O DE PERMISO FAMILAR (FAMILY LEAVE) NECESITA TRAERLOS A LA OFICINA PARA QUE SEAN PROCESADOS. NO  SE LOS DE A SU DOCTOR. A su alta del hospital se le dara una receta para Human resources officer. Tomela como ha sido recetada, si la necesita. Si no la necesita puede tomar, Acetaminofen (Tylenol) o Ibuprofen (Advil) para aliviar dolor moderado. Continue tomando el resto de sus medicinas. Si necesita rellenar la receta, llame a la farmacia. ellos contactan a nuestra oficina pidiendo autorizacion. Este tipo de receta no pueden ser [employer] de las  5pm o [employer] fines de [place]. Con relacion a la dieta: debe ser [employer] primeros dias despues que llege a la casa. Ejemplo: sopas y galleticas. Tome bastante liquido esos dias. La [employer] de los pacientes padecen de inflamacion y cambio de coloracion de la piel alrededor de las incisiones. esto toma dias en resolver.  pnerse una bolsa de hielo en el area affectada ayuda..  Es comun tambien tener un poco de estrenimiento si esta tomado medicinas para Chief Technology Officer. incremente la cantidad de liquidos a tomar y Engineer, production (Colace) esto previene el problema. Si ya tiene estrenimiento, es Designer, jewellery no ha defecado en 48 horas, puede tomar un laxativo (Milk of Magnesia or Miralax) uselo como el paquete le explica.  A menos que se le diga algo diferente. Remueva el bendaje a las 24-48 horas despues dela [place]. y puede banarse en la ducha sin ningun problema. usted puede tener steri-strips (pequenas curitas transparentes en la piel puesta encima de la incision)  Estas banditas strips should be left on the skin for 7-10 days.   Si su cirujano puso pegamento encima de la incision usted puede banarse bajo la ducha en 24 horas. Este pegamento empezara a caerse en las  proximas 2-3 semanas. Si le pusieron suturas o presillas (grapos) estos seran quitados en su proxima cita en la oficina. . ACTIVIDADES:  Puede hacer actividad ligera.  Como caminar , subir escaleras y poco a poco irlas incrementando tanto como las [place]. Puede tener relaciones sexuales cuando sea comfortable. No carge objetos pesados o haga esfuerzos que no sean aprovados por su doctor. Puede manejar en cuanto no esta tomando medicamentos fuertes (narcoticos) para Chief Technology Officer, pueda abrochar confortablemente el cinturon de seguridad, y pueda Personnel officer y usar los pedales de su vehiculo con seguridad. PUEDE REGRESAR A TRABAJAR  Debe ver a su doctor para una cita de seguimiento en 2-3 semanas despues de la [place].  OTRAS ISNSTRUCCIONES:___________________________________________________________________________________ Debbora Lacrosse A SU MEDICO: FIEBRE mayor de  101.0 No produccion de [place]. Sangramiento continue de la herida Incremento de Engineer, mining, enrojecimientio o drenaje de la herida (incision) Incremento de dolor abdominal.  The clinic staff is available to answer your questions during regular business hours.  Please don't hesitate to call and ask to speak to one of the nurses for clinical concerns.  If you have a medical emergency, go to the nearest emergency room or call 911.  A surgeon from [facility] is always on call at the hospital. [address], [place], [place]  [address] ? [address] [address], [place], [place]   [address] [phone] ? [phone] ? FAX [phone] Web site: [email or web address]    Tambin se descubri que tena diabetes. Lo tratamos con insulina mientras estaba en el hospital  y comenzar a tomar [place] [address] diariamente el 07/26/22.  Tiene una cita con [employer] de [place] puede verla el 15 de noviembre a las 0930 a.m. Puede llamarlos al [phone]  [facility]! Cudate y sintete  bien!  [place]  [address] [place], [place] [address] [phone]

## 2022-07-18 NOTE — Op Note (Signed)
  07/17/2022 - 07/18/2022  11:45 AM  PATIENT:  Shawna [name]  36 y.o. female  PRE-OPERATIVE DIAGNOSIS:  acute cholecystitis  POST-OPERATIVE DIAGNOSIS:  acute cholecystitis with empyema of the gallbladder  PROCEDURE:  Procedure(s): LAPAROSCOPIC CHOLECYSTECTOMY  SURGEON:  Surgeon(s): Georganna Skeans, MD  ASSISTANTS: none   ANESTHESIA:   local and general  EBL:  No intake/output data recorded.  BLOOD ADMINISTERED:none  DRAINS: none   SPECIMEN:  Excision  DISPOSITION OF SPECIMEN:  PATHOLOGY  COUNTS:  YES  DICTATION: .Dragon Dictation Findings: Acute cholecystitis with empyema of the gallbladder  Procedure in detail: informed consent was obtained she received intravenous antibiotics.  She was brought to the operating room and general endotracheal anesthesia was administered by the anesthesia staff.  Her abdomen was prepped and draped in a sterile fashion.The infraumbilical region was infiltrated with local. Infraumbilical incision was made. Subcutaneous tissues were dissected down revealing the anterior fascia. This was divided sharply along the midline. Peritoneal cavity was entered under direct vision without complication. A 0 Vicryl pursestring was placed around the fascial opening. Hassan trocar was inserted into the abdomen. The abdomen was insufflated with carbon dioxide in standard fashion. Under direct vision a 5 mm epigastric and 5 mm right abdominal port x2 were placed.  Laparoscopic exploration revealed a tensely distended and acutely inflamed gallbladder.  This was drained with the Nahzat suction device revealing purulent bile.  Once it was decompressed, the gallbladder was grasped at the dome and retracted superior medially.  Some omental adhesions were swept down from the body and infundibulum and the infundibulum was retracted inferior laterally.  Dissection began laterally and progressed medially identifying the cystic duct.  Dissection continued until a critical view  of safety was obtained around the cystic duct.  3 clips were placed proximally and one was placed distally and it was divided.  The cystic duct was quite wide, however, so I also placed a PDS Endoloop to reinforce the closure on the cystic duct.  This went well.  Next, the cystic artery was clipped twice proximally and then divided distally with cautery.  The gallbladder was gradually taken off the liver bed using cautery and achieving hemostasis along the way.  The gallbladder was placed in a bag and removed from the abdomen.  It was sent to pathology.  The liver bed was then copiously irrigated and excellent hemostasis was obtained.  All of the irrigation fluid was evacuated.  The liver bed was dry and clips remain in good position.  The port was removed under direct vision.  Pneumoperitoneum was released.  The infraumbilical fascia was closed by tying the pursestring.  All 4 wounds were irrigated and the skin of each was closed with 4-0 Vicryl followed by Dermabond.  All counts were correct.  She tolerated the procedure well without apparent complication was taken recovery in stable condition.   PATIENT DISPOSITION:  PACU - hemodynamically stable.   Delay start of Pharmacological VTE agent (>24hrs) due to surgical blood loss or risk of bleeding:  no  Georganna Skeans, MD, MPH, FACS Pager: [phone]  11/2/202311:45 AM

## 2022-07-18 NOTE — Anesthesia Preprocedure Evaluation (Signed)
Anesthesia Evaluation  Patient identified by MRN, date of birth, ID band Patient awake    Reviewed: Allergy & Precautions, NPO status , Patient's Chart, lab work & pertinent test results  History of Anesthesia Complications Negative for: history of anesthetic complications  Airway Mallampati: II  TM Distance: >3 FB Neck ROM: Full    Dental  (+) Dental Advisory Given   Pulmonary neg pulmonary ROS   breath sounds clear to auscultation       Cardiovascular negative cardio ROS  Rhythm:Regular Rate:Normal     Neuro/Psych negative neurological ROS     GI/Hepatic Neg liver ROS,,,N/V with acute cholecystitis   Endo/Other  diabetes (no insulin in 3 years, glu 208), Gestational  Morbid obesityBMI 40.9  Renal/GU negative Renal ROS     Musculoskeletal   Abdominal  (+) + obese  Peds  Hematology negative hematology ROS (+)   Anesthesia Other Findings   Reproductive/Obstetrics                             Anesthesia Physical Anesthesia Plan  ASA: 3  Anesthesia Plan: General   Post-op Pain Management: Ofirmev IV (intra-op)*   Induction: Intravenous and Rapid sequence  PONV Risk Score and Plan: 3 and Ondansetron, Dexamethasone and Scopolamine patch - Pre-op  Airway Management Planned: Oral ETT  Additional Equipment: None  Intra-op Plan:   Post-operative Plan: Extubation in OR  Informed Consent: I have reviewed the patients History and Physical, chart, labs and discussed the procedure including the risks, benefits and alternatives for the proposed anesthesia with the patient or authorized representative who has indicated his/her understanding and acceptance.     Dental advisory given and Interpreter used for interveiw  Plan Discussed with: CRNA and Surgeon  Anesthesia Plan Comments:        Anesthesia Quick Evaluation

## 2022-07-18 NOTE — TOC Initial Note (Signed)
Transition of Care [facility]) - Initial/Assessment Note    Patient Details  Name: Shawna [name] MRN: [id number] Date of Birth: 01/15/1986  [facility]) CM/SW Contact:    Marilu Favre, RN Phone Number: 07/18/2022, 3:35 PM  Clinical Narrative:                 Spoke to patient and significant other Lance Morin at bedside. Patient drowsy.   Used AM[employer] Interpreter Royetta Car [phone]   Patient from home with Ismael, face sheet information correct.  Patient does not have insurance. Financial counselor consult placed.  Patient does not have PCP. Discussed [facility], they are in agreement.   First available [facility] at [facility] July 31, 2022 at 0930 am, information placed on AVS. Patient has transportation.   Changed pharmacy to [facility]. At time of discharge scripts will go to [facility]. [facility] will call patient with cost . She can pay with cash, card or be billed. If she cannot afford medications, will see if she qualifies for [facility]. Patient only eligible for MATCH once a year. Ismael aware.   Expected Discharge Plan: Home/Self Care Barriers to Discharge: Continued Medical Work up   Patient Goals and CMS Choice Patient states their goals for this hospitalization and ongoing recovery are:: to return to home      Expected Discharge Plan and Services Expected Discharge Plan: Home/Self Care In-house Referral: Financial Counselor Discharge Planning Services: [facility], [place] [facility], Medication Assistance, CM Consult Post Acute Care Choice: NA Living arrangements for the past 2 months: Single Family Home                 DME Arranged: N/A         HH Arranged: NA          Prior Living Arrangements/Services Living arrangements for the past 2 months: Single Family Home Lives with:: Significant Other Patient language and need for interpreter reviewed:: Yes Do you feel safe going back to the place where you live?: Yes       Need for Family Participation in Patient Care: Yes (Comment) Care giver support system in place?: Yes (comment)   Criminal Activity/Legal Involvement Pertinent to Current Situation/Hospitalization: No - Comment as needed  Activities of Daily Living      Permission Sought/Granted   Permission granted to share information with : Yes, Verbal Permission Granted  Share Information with NAME: significant other Lance Morin [phone]           Emotional Assessment         Alcohol / Substance Use: Not Applicable Psych Involvement: No (comment)  Admission diagnosis:  Cholecystitis [K81.9] Hyperglycemia [R73.9] Patient Active Problem List   Diagnosis Date Noted   History of laparoscopic cholecystectomy 07/18/2022   Cholecystitis 07/17/2022   Hyperglycemia 07/17/2022   BMI 33.0-33.9,adult 09/21/2018   Language barrier affecting health care 09/21/2018   History of gestational diabetes 05/06/2018   Calculus of gallbladder and bile duct without cholecystitis or obstruction 08/24/2015   Hypertriglyceridemia 07/12/2014   Cholelithiases 01/03/2014   PCP:  Patient, No Pcp Per Pharmacy:   [facility], [address] [place]. [place] [address] Phone: [phone] Fax: [place], [place] [address] Phone: [phone] Fax: [phone]  [facility]  [address] [place] [address] Phone: [phone] Fax: [phone]     Social Determinants of Health (SDOH) Interventions    Readmission Risk Interventions     No data to display

## 2022-07-18 NOTE — Progress Notes (Signed)
     Daily Progress Note Intern Pager: [phone]  Patient name: Shawna [name] Medical record number: [id number] Date of birth: July 01, 1986 Age: 36 y.o. Gender: female  Primary Care Provider: Patient, No Pcp Per Consultants: Gen Surg Code Status: Full  Pt Overview and Major Events to Date:  11/1 admitted 11/2 Lap chole  Assessment and Plan: RC is a 36yo F w/ hx of gestational diabetes that is admitted for acute cholecystitis.  * Cholecystitis VSS. NPO since midnight for surgery today.  - Lap Chole w/ Gen Surg today - Tylenol 1000 q6h prn - Fentanyl 44mcg q2h prn for severe pain - Reassess pain control, antibx, diet, and DVT ppx after procedure   Hyperglycemia A1c 11.1. Has hx of gestational diabetes that she was on insulin for. Glucose now in 200's since starting sensitive SSI. - SSI (reassess sensitivity scale once diet restarted) - CBG monitoring - TOC consult. Pt needs a PCP and outpt management of diabetes.   FEN/GI: NPO. Reassess after surgery. PPx: SCDs, reassess after surgery Dispo:Pending PT recommendations  pending clinical improvement . Barriers include surgery today.   Subjective:  Pt reports ongoing RUQ and epigastric pain, although it is not as intense as before. She also reports a headache this morning, she normally take aspirin at home for this. NPO since MN  Objective: Temp:  [98.1 F (36.7 C)-99 F (37.2 C)] 98.6 F (37 C) (11/02 1155) Pulse Rate:  [60-89] 62 (11/02 1230) Resp:  [14-18] 14 (11/02 1230) BP: (111-157)/(71-111) 118/85 (11/02 1230) SpO2:  [96 %-100 %] 97 % (11/02 1230) Weight:  [108 kg-108.9 kg] 108 kg (11/02 0927) Physical Exam: General: Pleasant woman, laying in bed. NAD. Cardiovascular: RRR Respiratory: Normal WOB on RA Abdomen: Normal BS. Tender in RUQ and epigastric region.   Laboratory: Most recent CBC Lab Results  Component Value Date   WBC 8.6 07/18/2022   HGB 13.4 07/18/2022   HCT 38.9 07/18/2022   MCV 90.3  07/18/2022   PLT 364 07/18/2022   Most recent BMP    Latest Ref Rng & Units 07/18/2022    3:15 AM  BMP  Glucose 70 - 99 mg/dL 232   BUN 6 - 20 mg/dL 11   Creatinine 0.44 - 1.00 mg/dL 0.57   Sodium 135 - 145 mmol/L 137   Potassium 3.5 - 5.1 mmol/L 3.6   Chloride 98 - 111 mmol/L 102   CO2 22 - 32 mmol/L 24   Calcium 8.9 - 10.3 mg/dL 8.7     Arlyce Dice, MD 07/18/2022, 12:36 PM  PGY-1, [place] Intern pager: [phone], text pages welcome Secure chat group [place]

## 2022-07-18 NOTE — Progress Notes (Addendum)
Progress Note     Subjective: Ongoing abdominal pain that is improved with pain medications. Mild headache this am. No other complaints. Husband is bedside   Objective: Vital signs in last 24 hours: Temp:  [98.1 F (36.7 C)-99 F (37.2 C)] 98.2 F (36.8 C) (11/02 0813) Pulse Rate:  [60-89] 68 (11/02 0813) Resp:  [16-18] 16 (11/02 0813) BP: (114-157)/(74-111) 114/82 (11/02 0813) SpO2:  [96 %-100 %] 99 % (11/02 0813) Weight:  [108.9 kg] 108.9 kg (11/01 1641)    Intake/Output from previous day: No intake/output data recorded. Intake/Output this shift: No intake/output data recorded.  PE: General: pleasant, WD, female who is laying in bed in NAD HEENT: head is normocephalic, atraumatic.  Sclera are noninjected.  Pupils equal and round. EOMs intact.  Ears and nose without any masses or lesions.  Mouth is pink and moist Lungs: Respiratory effort nonlabored Abd: soft, ND, mild focal TTP RUQ without rebound or guarding MSK: all 4 extremities are symmetrical with no cyanosis, clubbing, or edema. Skin: warm and dry with no masses, lesions, or rashes Neuro: Cranial nerves 2-12 grossly intact, sensation is normal throughout Psych: A&Ox3 with an appropriate affect.    Lab Results:  Recent Labs    07/17/22 1711 07/18/22 0315  WBC 8.0 8.6  HGB 15.4* 13.4  HCT 44.2 38.9  PLT 399 364   BMET Recent Labs    07/17/22 1711 07/18/22 0315  NA 140 137  K 4.0 3.6  CL 103 102  CO2 22 24  GLUCOSE 352* 232*  BUN 12 11  CREATININE 0.64 0.57  CALCIUM 9.6 8.7*   PT/INR No results for input(s): "LABPROT", "INR" in the last 72 hours. CMP     Component Value Date/Time   NA 137 07/18/2022 0315   K 3.6 07/18/2022 0315   CL 102 07/18/2022 0315   CO2 24 07/18/2022 0315   GLUCOSE 232 (H) 07/18/2022 0315   BUN 11 07/18/2022 0315   CREATININE 0.57 07/18/2022 0315   CREATININE 0.56 07/12/2014 1107   CALCIUM 8.7 (L) 07/18/2022 0315   PROT 6.6 07/18/2022 0315   ALBUMIN 3.4 (L)  07/18/2022 0315   AST 13 (L) 07/18/2022 0315   ALT 18 07/18/2022 0315   ALKPHOS 86 07/18/2022 0315   BILITOT 0.5 07/18/2022 0315   GFRNONAA >60 07/18/2022 0315   GFRNONAA >89 07/12/2014 1107   GFRAA >60 11/23/2018 2158   GFRAA >89 07/12/2014 1107   Lipase     Component Value Date/Time   LIPASE 28 07/17/2022 1711       Studies/Results: CT ABDOMEN PELVIS W CONTRAST  Result Date: 07/17/2022 CLINICAL DATA:  Right abdominal pain, nausea and vomiting. No subjective fevers. EXAM: CT ABDOMEN AND PELVIS WITH CONTRAST TECHNIQUE: Multidetector CT imaging of the abdomen and pelvis was performed using the standard protocol following bolus administration of intravenous contrast. RADIATION DOSE REDUCTION: This exam was performed according to the departmental dose-optimization program which includes automated exposure control, adjustment of the mA and/or kV according to patient size and/or use of iterative reconstruction technique. CONTRAST:  16mL OMNIPAQUE IOHEXOL 350 MG/ML SOLN COMPARISON:  Right upper quadrant ultrasound yesterday, showing cholelithiasis without findings of acute cholecystitis. CT with IV contrast 01/05/2014. FINDINGS: Lower chest: No acute abnormality. Calcified granuloma again noted right lower lobe lateral base. Hepatobiliary: The tiny layering stones in the gallbladder noted on yesterday's ultrasound are not visible by CT. The gallbladder has become slightly thickened with faint pericholecystic edema and trace pericholecystic fluid worrisome for acute cholecystitis.  There is no biliary dilatation. Liver is 18 cm length mildly steatotic without mass. Pancreas: No focal abnormality. Spleen: No focal abnormality or splenomegaly. Adrenals/Urinary Tract: There is no adrenal or renal cortical mass enhancement. No urinary stones or obstruction. The bladder wall and lumen are unremarkable. Stomach/Bowel: No dilatation or wall thickening including of the appendix. There are scattered colonic  diverticula without evidence of diverticulitis. Mild constipation ascending, transverse colon. No distal ileal fecal back up. Vascular/Lymphatic: No significant vascular findings are present. No enlarged abdominal or pelvic lymph nodes. Reproductive: Uterus and bilateral adnexa are unremarkable. Other: No pelvic free fluid is seen. No abdominal or pelvic free hemorrhage, free air or abscess. There is no incarcerated hernia. Musculoskeletal: Chronic bilateral non erosive sacroiliitis is again noted and slightly greater on the right, but unchanged. There is mild spondylosis. IMPRESSION: 1. CT findings worrisome for acute cholecystitis. Tiny layering stones were noted in the gallbladder on ultrasound but are not visible with CT. Surgical consult recommended. 2. Mild hepatic steatosis. 3. Constipation and diverticulosis. 4. Chronic nonerosive sacroiliitis. Electronically Signed   By: Telford Nab M.D.   On: 07/17/2022 20:09   US Abdomen Limited RUQ (LIVER/GB)  Result Date: 07/16/2022 CLINICAL DATA:  Right upper quadrant pain EXAM: ULTRASOUND ABDOMEN LIMITED RIGHT UPPER QUADRANT COMPARISON:  None Available. FINDINGS: Gallbladder: Small gallstones are present. No gallbladder wall thickening or pericholecystic fluid. No sonographic Murphy sign noted by sonographer. Common bile duct: Diameter: 3.3 mm Liver: No focal lesion identified. Within normal limits in parenchymal echogenicity. Portal vein is patent on color Doppler imaging with normal direction of blood flow towards the liver. Other: None. IMPRESSION: Cholelithiasis without sonographic evidence of acute cholecystitis. Electronically Signed   By: Ronney Asters M.D.   On: 07/16/2022 22:56    Anti-infectives: Anti-infectives (From admission, onward)    Start     Dose/Rate Route Frequency Ordered Stop   07/17/22 2030  cefTRIAXone (ROCEPHIN) 2 g in sodium chloride 0.9 % 100 mL IVPB        2 g 200 mL/hr over 30 Minutes Intravenous  Once 07/17/22 2018  07/17/22 2201        Assessment/Plan Acute cholecystitis - ongoing abdominal pain this am - improved blood glucose this am - 208 - no prior abdominal surgeries. plan for OR today for lap chole I have explained the procedure, risks, and aftercare of cholecystectomy.  Risks include but are not limited to bleeding, infection, wound problems, anesthesia, diarrhea, bile leak, injury to common bile duct/liver/intestine.  She seems to understand and agrees to proceed  FEN: NPO for OR. Can have carb mod diet after ID: rocephin yesterday. Rocephin periop VTE: scds    LOS: 0 days   Winferd Humphrey, [facility] Surgery 07/18/2022, 8:33 AM Please see Amion for pager number during day hours 7:00am-4:30pm

## 2022-07-18 NOTE — Anesthesia Procedure Notes (Signed)
Procedure Name: Intubation Date/Time: 07/18/2022 10:26 AM  Performed by: Erick Colace, CRNAPre-anesthesia Checklist: Patient identified, Emergency Drugs available, Suction available and Patient being monitored Patient Re-evaluated:Patient Re-evaluated prior to induction Oxygen Delivery Method: Circle system utilized Preoxygenation: Pre-oxygenation with 100% oxygen Induction Type: IV induction, Cricoid Pressure applied and Rapid sequence Ventilation: Mask ventilation without difficulty Laryngoscope Size: Mac and 4 Grade View: Grade I Tube type: Oral Number of attempts: 1 Airway Equipment and Method: Stylet and Oral airway Placement Confirmation: ETT inserted through vocal cords under direct vision, positive ETCO2 and breath sounds checked- equal and bilateral Tube secured with: Tape Dental Injury: Teeth and Oropharynx as per pre-operative assessment

## 2022-07-18 NOTE — Transfer of Care (Signed)
Immediate Anesthesia Transfer of Care Note  Patient: Shawna [name]  Procedure(s) Performed: LAPAROSCOPIC CHOLECYSTECTOMY (Abdomen)  Patient Location: PACU  Anesthesia Type:General  Level of Consciousness: drowsy  Airway & Oxygen Therapy: Patient Spontanous Breathing  Post-op Assessment: Report given to RN and Post -op Vital signs reviewed and stable  Post vital signs: Reviewed and stable  Last Vitals:  Vitals Value Taken Time  BP 111/71 07/18/22 1153  Temp    Pulse 60 07/18/22 1157  Resp 17 07/18/22 1157  SpO2 98 % 07/18/22 1157  Vitals shown include unvalidated device data.  Last Pain:  Vitals:   07/18/22 1002  TempSrc:   PainSc: 5       Patients Stated Pain Goal: 0 (68/61/68 3729)  Complications: No notable events documented.

## 2022-07-18 NOTE — Anesthesia Postprocedure Evaluation (Signed)
Anesthesia Post Note  Patient: Shawna [name]  Procedure(s) Performed: LAPAROSCOPIC CHOLECYSTECTOMY (Abdomen)     Patient location during evaluation: PACU Anesthesia Type: General Level of consciousness: patient cooperative, oriented and sedated Pain management: pain level controlled Vital Signs Assessment: post-procedure vital signs reviewed and stable Respiratory status: spontaneous breathing, nonlabored ventilation and respiratory function stable Cardiovascular status: blood pressure returned to baseline and stable Postop Assessment: no apparent nausea or vomiting Anesthetic complications: no   No notable events documented.  Last Vitals:  Vitals:   07/18/22 1245 07/18/22 1300  BP: 118/61 106/84  Pulse: (!) 54 (!) 59  Resp: 15 14  Temp:  37 C  SpO2: 93% 96%    Last Pain:  Vitals:   07/18/22 1300  TempSrc:   PainSc: Asleep                 Song Garris,E. Jhon Mallozzi

## 2022-07-19 ENCOUNTER — Encounter (HOSPITAL_COMMUNITY): Payer: Self-pay | Admitting: General Surgery

## 2022-07-19 ENCOUNTER — Other Ambulatory Visit (HOSPITAL_COMMUNITY): Payer: Self-pay

## 2022-07-19 DIAGNOSIS — Z9049 Acquired absence of other specified parts of digestive tract: Secondary | ICD-10-CM

## 2022-07-19 DIAGNOSIS — E119 Type 2 diabetes mellitus without complications: Secondary | ICD-10-CM

## 2022-07-19 LAB — GLUCOSE, CAPILLARY
Glucose-Capillary: 322 mg/dL — ABNORMAL HIGH (ref 70–99)
Glucose-Capillary: 261 mg/dL — ABNORMAL HIGH (ref 70–99)

## 2022-07-19 LAB — SURGICAL PATHOLOGY

## 2022-07-19 MED ORDER — IBUPROFEN 600 MG PO TABS
600.0000 mg | ORAL_TABLET | Freq: Four times a day (QID) | ORAL | Status: DC
  Administered 2022-07-19: 600 mg via ORAL
  Filled 2022-07-19: qty 1

## 2022-07-19 MED ORDER — METFORMIN HCL ER 500 MG PO TB24
500.0000 mg | ORAL_TABLET | Freq: Every day | ORAL | 1 refills | Status: DC
  Filled 2022-07-19: qty 30, 30d supply, fill #0

## 2022-07-19 MED ORDER — LIDOCAINE 5 % EX PTCH
1.0000 | MEDICATED_PATCH | CUTANEOUS | Status: DC
  Administered 2022-07-19: 1 via TRANSDERMAL
  Filled 2022-07-19: qty 1

## 2022-07-19 MED ORDER — OXYCODONE HCL 5 MG PO TABS
5.0000 mg | ORAL_TABLET | Freq: Four times a day (QID) | ORAL | 0 refills | Status: AC | PRN
  Filled 2022-07-19: qty 12, 3d supply, fill #0

## 2022-07-19 MED ORDER — POLYETHYLENE GLYCOL 3350 17 GM/SCOOP PO POWD
17.0000 g | Freq: Every day | ORAL | 0 refills | Status: AC
  Filled 2022-07-19: qty 238, 14d supply, fill #0

## 2022-07-19 MED ORDER — ACETAMINOPHEN 500 MG PO TABS
1000.0000 mg | ORAL_TABLET | Freq: Four times a day (QID) | ORAL | Status: DC
  Administered 2022-07-19: 1000 mg via ORAL
  Filled 2022-07-19: qty 2

## 2022-07-19 MED ORDER — LIVING WELL WITH DIABETES BOOK - IN SPANISH
Freq: Once | Status: AC
  Filled 2022-07-19: qty 1

## 2022-07-19 MED ORDER — ENOXAPARIN SODIUM 60 MG/0.6ML IJ SOSY
50.0000 mg | PREFILLED_SYRINGE | INTRAMUSCULAR | Status: DC
  Administered 2022-07-19: 50 mg via SUBCUTANEOUS
  Filled 2022-07-19: qty 0.6

## 2022-07-19 MED ORDER — DOCUSATE SODIUM 100 MG PO CAPS: 100.0000 mg | ORAL_CAPSULE | Freq: Two times a day (BID) | ORAL | 0 refills | Status: AC

## 2022-07-19 MED ORDER — ACETAMINOPHEN 500 MG PO TABS: 1000.0000 mg | ORAL_TABLET | Freq: Four times a day (QID) | ORAL | 0 refills | Status: AC

## 2022-07-19 MED ORDER — POLYETHYLENE GLYCOL 3350 17 G PO PACK: 17.0000 g | PACK | Freq: Two times a day (BID) | ORAL | Status: DC | PRN

## 2022-07-19 MED ORDER — POLYETHYLENE GLYCOL 3350 17 G PO PACK
17.0000 g | PACK | Freq: Once | ORAL | Status: AC
  Administered 2022-07-19: 17 g via ORAL
  Filled 2022-07-19: qty 1

## 2022-07-19 NOTE — Inpatient Diabetes Management (Addendum)
Inpatient Diabetes Program Recommendations  AACE/ADA: New Consensus Statement on Inpatient Glycemic Control (2015)  Target Ranges:  Prepandial:   less than 140 mg/dL      Peak postprandial:   less than 180 mg/dL (1-2 hours)      Critically ill patients:  140 - 180 mg/dL   Lab Results  Component Value Date   GLUCAP 261 (H) 07/19/2022   HGBA1C 11.1 (H) 07/17/2022    Review of Glycemic Control  Diabetes history: gestational diabetes in the past Outpatient Diabetes medications: none Current orders for Inpatient glycemic control: novolog 0-6 units TID correction scale   Inpatient Diabetes Program Recommendations:   Spoke with patient at the bedside with translation from [employer] Patient states that she had gestational diabetes when pregnant and took insulin at that time. She does not have a PCP and has not been checking blood sugars at home. Gave patient a [place] home blood glucose meter kit since she does not have insurance. She does know how to use it since she used one in the past.   Discussed HgbA1C of 11.1% and that 6.5% A1C is diagnosis of diabetes. We discussed normal blood sugar range. Reviewed diet with plate method and given information. Living Well with Diabetes booklet in Spanish was ordered. She will need to follow up with a PCP at the clinic where the case manager has made her an appointment at the [facility] on July 31, 2022. She could start on oral medications until seen by PCP.   Will continue to monitor blood sugars while in the hospital.  Harvel Ricks RN BSN CDE Diabetes Coordinator Pager: [phone]  8am-5pm

## 2022-07-19 NOTE — Progress Notes (Signed)
Progress Note  1 Day Post-Op  Subjective: Having some expected post op pain. Tolerating soft diet. Passing gas. Ambulated to bathroom. Husband bedside  Objective: Vital signs in last 24 hours: Temp:  [98.1 F (36.7 C)-98.7 F (37.1 C)] 98.1 F (36.7 C) (11/03 0341) Pulse Rate:  [54-72] 71 (11/03 0341) Resp:  [14-18] 17 (11/03 0341) BP: (103-121)/(61-87) 111/69 (11/03 0341) SpO2:  [93 %-100 %] 100 % (11/03 0341) Weight:  [409 kg] 108 kg (11/02 0927)    Intake/Output from previous day: 11/02 0701 - 11/03 0700 In: 1560 [P.O.:460; I.V.:1100] Out: 50 [Blood:50] Intake/Output this shift: No intake/output data recorded.  PE: General: pleasant, WD, female who is laying in bed in NAD Lungs: Respiratory effort nonlabored Abd: soft, ND, mild TTP around incision which are c/d/I with surgical glue MSK: all 4 extremities are symmetrical with no cyanosis, clubbing, or edema. Skin: warm and dry Psych: A&Ox3 with an appropriate affect.    Lab Results:  Recent Labs    07/17/22 1711 07/18/22 0315  WBC 8.0 8.6  HGB 15.4* 13.4  HCT 44.2 38.9  PLT 399 364    BMET Recent Labs    07/17/22 1711 07/18/22 0315  NA 140 137  K 4.0 3.6  CL 103 102  CO2 22 24  GLUCOSE 352* 232*  BUN 12 11  CREATININE 0.64 0.57  CALCIUM 9.6 8.7*    PT/INR No results for input(s): "LABPROT", "INR" in the last 72 hours. CMP     Component Value Date/Time   NA 137 07/18/2022 0315   K 3.6 07/18/2022 0315   CL 102 07/18/2022 0315   CO2 24 07/18/2022 0315   GLUCOSE 232 (H) 07/18/2022 0315   BUN 11 07/18/2022 0315   CREATININE 0.57 07/18/2022 0315   CREATININE 0.56 07/12/2014 1107   CALCIUM 8.7 (L) 07/18/2022 0315   PROT 6.6 07/18/2022 0315   ALBUMIN 3.4 (L) 07/18/2022 0315   AST 13 (L) 07/18/2022 0315   ALT 18 07/18/2022 0315   ALKPHOS 86 07/18/2022 0315   BILITOT 0.5 07/18/2022 0315   GFRNONAA >60 07/18/2022 0315   GFRNONAA >89 07/12/2014 1107   GFRAA >60 11/23/2018 2158   GFRAA >89  07/12/2014 1107   Lipase     Component Value Date/Time   LIPASE 28 07/17/2022 1711       Studies/Results: CT ABDOMEN PELVIS W CONTRAST  Result Date: 07/17/2022 CLINICAL DATA:  Right abdominal pain, nausea and vomiting. No subjective fevers. EXAM: CT ABDOMEN AND PELVIS WITH CONTRAST TECHNIQUE: Multidetector CT imaging of the abdomen and pelvis was performed using the standard protocol following bolus administration of intravenous contrast. RADIATION DOSE REDUCTION: This exam was performed according to the departmental dose-optimization program which includes automated exposure control, adjustment of the mA and/or kV according to patient size and/or use of iterative reconstruction technique. CONTRAST:  76mL OMNIPAQUE IOHEXOL 350 MG/ML SOLN COMPARISON:  Right upper quadrant ultrasound yesterday, showing cholelithiasis without findings of acute cholecystitis. CT with IV contrast 01/05/2014. FINDINGS: Lower chest: No acute abnormality. Calcified granuloma again noted right lower lobe lateral base. Hepatobiliary: The tiny layering stones in the gallbladder noted on yesterday's ultrasound are not visible by CT. The gallbladder has become slightly thickened with faint pericholecystic edema and trace pericholecystic fluid worrisome for acute cholecystitis. There is no biliary dilatation. Liver is 18 cm length mildly steatotic without mass. Pancreas: No focal abnormality. Spleen: No focal abnormality or splenomegaly. Adrenals/Urinary Tract: There is no adrenal or renal cortical mass enhancement. No urinary stones  or obstruction. The bladder wall and lumen are unremarkable. Stomach/Bowel: No dilatation or wall thickening including of the appendix. There are scattered colonic diverticula without evidence of diverticulitis. Mild constipation ascending, transverse colon. No distal ileal fecal back up. Vascular/Lymphatic: No significant vascular findings are present. No enlarged abdominal or pelvic lymph nodes.  Reproductive: Uterus and bilateral adnexa are unremarkable. Other: No pelvic free fluid is seen. No abdominal or pelvic free hemorrhage, free air or abscess. There is no incarcerated hernia. Musculoskeletal: Chronic bilateral non erosive sacroiliitis is again noted and slightly greater on the right, but unchanged. There is mild spondylosis. IMPRESSION: 1. CT findings worrisome for acute cholecystitis. Tiny layering stones were noted in the gallbladder on ultrasound but are not visible with CT. Surgical consult recommended. 2. Mild hepatic steatosis. 3. Constipation and diverticulosis. 4. Chronic nonerosive sacroiliitis. Electronically Signed   By: Telford Nab M.D.   On: 07/17/2022 20:09    Anti-infectives: Anti-infectives (From admission, onward)    Start     Dose/Rate Route Frequency Ordered Stop   07/19/22 1200  cefTRIAXone (ROCEPHIN) 2 g in sodium chloride 0.9 % 100 mL IVPB        2 g 200 mL/hr over 30 Minutes Intravenous  Once 07/18/22 1318     07/17/22 2030  cefTRIAXone (ROCEPHIN) 2 g in sodium chloride 0.9 % 100 mL IVPB        2 g 200 mL/hr over 30 Minutes Intravenous  Once 07/17/22 2018 07/17/22 2201        Assessment/Plan Acute cholecystitis POD1 s/p lap chole 11/2 Dr. Grandville Silos - findings of empyema - needs additional dose of rocephin today - tolerating diet. Pain controlled  Stable for discharge from surgical perspective once rocephin complete. Will send pain medications. Work note provided  FEN: carb mod ID: rocephin VTE: scds, lovenox    LOS: 0 days   Winferd Humphrey, [facility] Surgery 07/19/2022, 8:02 AM Please see Amion for pager number during day hours 7:00am-4:30pm

## 2022-07-19 NOTE — Progress Notes (Signed)
Work note printed in Psychologist, prison and probation services and [place] and given to patient for discharge

## 2022-07-19 NOTE — Progress Notes (Signed)
Discharge meds delivered to pt room  

## 2022-07-19 NOTE — Progress Notes (Signed)
Mobility Specialist - Progress Note   07/19/22 1200  Mobility  Activity Ambulated independently in hallway  Level of Assistance Modified independent, requires aide device or extra time  Assistive Device Front wheel walker  Distance Ambulated (ft) 60 ft  Activity Response Tolerated well  $Mobility charge 1 Mobility    Pt ambulated independently in hallway. No physical assistance required.   Paulla Dolly Mobility Specialist

## 2022-07-19 NOTE — Discharge Summary (Addendum)
[facility] Discharge Summary  Patient name: Shawna [name] Medical record number: [id number] Date of birth: 14-Nov-1985 Age: 36 y.o. Gender: female Date of Admission: 07/17/2022  Date of Discharge: 07/19/22 Admitting Physician: Alen Bleacher, MD  Primary Care Provider: Patient, No Pcp Per Consultants: General Surgery  Indication for Hospitalization: Acute cholecystitis  Brief Hospital Course:  Shawna [name] is a 36 y.o. female w/ hx of gallstones, gestational diabetes, obesity presenting with R-sided abdominal pain w/ N/V and found to have acute cholecystitis and new T2DM. She was admitted to [facility] Medicine Teaching Service. Hospital course is outlined below:  Acute Cholecystitis  On admission, VSS and WBC wnl. RUQ U/S and CT findings c/w acute cholecystitis. Pt was started on Ceftriaxone. Pt underwent laparoscopic cholecystectomy with General Surgery. Pain was controlled with Tylenol, oxycodone, and dilaudid. Pt was passing gas and tolerating PO by POD1. Pt discharged with Tylenol and Oxy for pain control and Miralax for bowel regimen.   T2DM On admission, glucose elevated to 350's. A1c elevated to 11.1. Glucose controlled on sliding-scale insulin while admitted. Pt was given diabetes education and given Metformin to start 1 week post-op. Pt was set-up with a PCP for outpatient management of T2DM.   Discharge Diagnoses/Problem List:  Acute cholecystectomy (s/p lap chole), T2DM  Disposition: Home  Discharge Condition: Medically stable   Discharge Exam:  Gen: Well-appearing, pleasant woman laying comofrtably in bed. NAD HEENT: NCAT. MMM. Resp: Normal WOB on RA. CTAB. CV: RRR Abm: Soft, nondistended, normal BS. 3 incision sites on abm clean and dry.  Issues for Follow Up:  Discuss birth control options. Pt is not currently on birth control. Started metformin for new T2DM, please reassess and make adjustments as appropriate.  Significant  Procedures: Lap Cholecystectomy   Significant Labs and Imaging:  Recent Labs  Lab 07/17/22 1711 07/18/22 0315  WBC 8.0 8.6  HGB 15.4* 13.4  HCT 44.2 38.9  PLT 399 364   Recent Labs  Lab 07/17/22 1711 07/18/22 0315  NA 140 137  K 4.0 3.6  CL 103 102  CO2 22 24  GLUCOSE 352* 232*  BUN 12 11  CREATININE 0.64 0.57  CALCIUM 9.6 8.7*  ALKPHOS 102 86  AST 23 13*  ALT 19 18  ALBUMIN 3.8 3.4*    RUQ U/S  Cholelithiasis without sonographic evidence of acute cholecystitis.  CTAP 1. CT findings worrisome for acute cholecystitis. Tiny layering stones were noted in the gallbladder on ultrasound but are not visible with CT. Surgical consult recommended. 2. Mild hepatic steatosis. 3. Constipation and diverticulosis. 4. Chronic nonerosive sacroiliitis.  Results/Tests Pending at Time of Discharge: none  Discharge Medications:  Allergies as of 07/19/2022   No Known Allergies      Medication List     TAKE these medications    acetaminophen 500 MG tablet Commonly known as: TYLENOL Take 2 tablets (1,000 mg total) by mouth every 6 (six) hours for 3 days.   CALCIUM PO Take 1 tablet by mouth daily.   docusate sodium 100 MG capsule Commonly known as: Colace Take 1 capsule (100 mg total) by mouth 2 (two) times daily for 3 days.   ibuprofen 400 MG tablet Commonly known as: ADVIL Take 800 mg by mouth every 6 (six) hours as needed for mild pain or moderate pain.   metFORMIN 500 MG 24 hr tablet Commonly known as: GLUCOPHAGE-XR Tome 1 tableta (500 mg en total) por va oral diariamente con el desayuno. (Take 1 tablet (  500 mg total) by mouth daily with breakfast.) Start taking on: July 26, 2022   oxyCODONE 5 MG immediate release tablet Commonly known as: Oxy IR/ROXICODONE Take 1 tablet (5 mg total) by mouth every 6 (six) hours as needed for up to 5 days for moderate pain or severe pain.   polyethylene glycol powder 17 GM/SCOOP powder Commonly known as:  GLYCOLAX/MIRALAX Take 17 g by mouth daily.        Discharge Instructions: Please refer to Patient Instructions section of EMR for full details.  Patient was counseled important signs and symptoms that should prompt return to medical care, changes in medications, dietary instructions, activity restrictions, and follow up appointments.   Follow-Up Appointments:  Follow-up Information     Maczis, Hedda Slade, [place]. Go on 08/05/2022.   Specialty: General Surgery Why: follow up on 08/05/22 at 1:45 pm. Please arrive 30 minutes early to complete check in, and bring photo ID and insurance card. Contact information: [address] [facility] [place] [address] [phone]         [facility] Follow up.   Specialty: Internal Medicine Why: July 31, 2022 at 0930 am Contact information: [address] [id number] mc [place] [address] [phone]                Lincoln Brigham, MD 07/19/2022, 1:36 PM PGY-1, [facility]   I was personally present and performed or re-performed the history, physical exam and medical decision making activities of this service and have verified that the service and findings are accurately documented in the intern's note. My edits are noted within the note above. Please also see attending's attestation.   Reece Leader, DO                  07/19/2022, 1:41 PM  PGY-3, [facility]

## 2022-07-19 NOTE — Hospital Course (Addendum)
Shawna [name] is a 36 y.o. female w/ hx of gallstones, gestational diabetes, obesity presenting with R-sided abdominal pain w/ N/V and found to have acute cholecystitis and new T2DM. She was admitted to [facility] Medicine Teaching Service. Hospital course is outlined below:  Acute Cholecystitis  On admission, VSS and WBC wnl. RUQ U/S and CT findings c/w acute cholecystitis. Pt was started on Ceftriaxone. Pt underwent laparoscopic cholecystectomy with General Surgery. Pain was controlled with Tylenol, oxycodone, and dilaudid. Pt was passing gas and tolerating PO by POD1. Pt discharged with Tylenol and Oxy for pain control and Miralax for bowel regimen.   T2DM On admission, glucose elevated to 350's. A1c elevated to 11.1. Glucose controlled on sliding-scale insulin while admitted. Pt was given diabetes education and given Metformin to start 1 week post-op. Pt was set-up with a PCP for outpatient management of T2DM.  Issues for follow up: Discuss birth control Started metformin for new T2DM, please reassess and make adjustments as appropriate.

## 2022-07-19 NOTE — Progress Notes (Signed)
Patient discharged to home with husband via wheelchair with all belongings by Nora,NT

## 2022-07-19 NOTE — Progress Notes (Signed)
Patient and husband demonstrated on how to use [employer]ion Premier Classic glucometer for home use. Patient and husband also verbalized understanding of dc instructions.

## 2022-07-31 ENCOUNTER — Ambulatory Visit (INDEPENDENT_AMBULATORY_CARE_PROVIDER_SITE_OTHER): Payer: Self-pay | Admitting: Nurse Practitioner

## 2022-07-31 ENCOUNTER — Encounter: Payer: Self-pay | Admitting: Nurse Practitioner

## 2022-07-31 VITALS — BP 107/65 | HR 77 | Temp 98.3°F | Ht 63.0 in | Wt 202.4 lb

## 2022-07-31 DIAGNOSIS — Z23 Encounter for immunization: Secondary | ICD-10-CM

## 2022-07-31 DIAGNOSIS — Z09 Encounter for follow-up examination after completed treatment for conditions other than malignant neoplasm: Secondary | ICD-10-CM

## 2022-07-31 DIAGNOSIS — K819 Cholecystitis, unspecified: Secondary | ICD-10-CM

## 2022-07-31 DIAGNOSIS — E119 Type 2 diabetes mellitus without complications: Secondary | ICD-10-CM

## 2022-07-31 DIAGNOSIS — Z6833 Body mass index (BMI) 33.0-33.9, adult: Secondary | ICD-10-CM

## 2022-07-31 MED ORDER — METFORMIN HCL ER 500 MG PO TB24: ORAL_TABLET | ORAL | 3 refills | Status: DC

## 2022-07-31 NOTE — Assessment & Plan Note (Signed)
Patient was on admission at the hospital On July 17, 2022 to July 19, 2022 for acute cholecystitis.  Patient underwent laparoscopic cholecystectomy with general surgery.  She currently denies abdominal pain, fever, chills, nausea, vomiting, states that she has been passing gas takes Tylenol and oxycodone as needed for pain.  Has upcoming appointment with general surgery on August 05, 2022.  Hospital discharge summary labs imaging studies reviewed today

## 2022-07-31 NOTE — Progress Notes (Signed)
[facility] [address] [place] [address]                                   Transitional Care Clinic   [facility] Discharge Acute Issues Care Follow Up                                                                        Patient Demographics  Shawna [name], is a 36 y.o. female  DOB 21-Nov-1985  MRN [id number].  Primary MD  Donell Beers, FNP   Reason for TCC follow Up MS.  Cortes Pana with past medical history of type 2 diabetes, cholecystitis, hypertriglyceridemia, obesity presents for follow-up for hospital admission for cholecystitis and to establish care with new provider for her chronic medical conditions.  Patient was on admission at the hospital On July 17, 2022 to July 19, 2022 for acute cholecystitis.  Patient underwent laparoscopic cholecystectomy with general surgery.  She currently denies abdominal pain, fever, chills, nausea, vomiting, states that she has been passing gas takes Tylenol and oxycodone as needed for pain.  Has upcoming appointment with general surgery on August 05, 2022.  Type 2 diabetes.  Currently on metformin 500 mg daily, patient denies any adverse reaction to this medication.  She denies polyphagia, polyuria urea, polydipsia, hypoglycemia . CBG readings at home has been between 121-216.   She stated that her Last Pap exam was done this year at the [facility] also has Nexplanon in place, records requested from the [facility] today.  Due for flu vaccine, flu vaccine administered in the office today.       Past Medical History:  Diagnosis Date   Diabetes mellitus without complication (HCC)    Gallstone    Gestational diabetes    History of gestational diabetes 05/06/2018   Failed 1 hr and 3 hr glucola Current Diabetic Medications:  Insulin  [x]  Aspirin 81 mg daily after 12 weeks (? A2/B GDM)   For A2/B GDM or higher classes of DM [x]  Diabetes Education and  Testing Supplies [x]  Nutrition Counsult   Baseline and surveillance labs (pulled in from [facility], refresh links as needed)  Lab Results Component Value Date  CREATININE 0.55 07/25/2015  AST 31 07/25/2015  ALT 46 11/0    Past Surgical History:  Procedure Laterality Date   CHOLECYSTECTOMY N/A 07/18/2022   Procedure: LAPAROSCOPIC CHOLECYSTECTOMY;  Surgeon: 13/04/2015, MD;  Location: [facility] OR;  Service: General;  Laterality: N/A;   NO PAST SURGERIES        Subjective:   13/10/2021 today has, No headache, No chest pain, No abdominal pain - No Nausea, No new weakness tingling or numbness, No Cough - SOB, CP, edema , nausea, vomiting, Diarrhea, fever.   Assessment & Plan   Cholecystitis Status post lap chloe. Patient currently denies abdominal pain, fever, chills, nausea, vomiting Has upcoming appointment with general surgery, patient encouraged to keep this appointment Incision sites clean and dry no sign of infection noted today today   Diabetes mellitus without complication [facility]) Lab Results  Component Value Date   HGBA1C  11.1 (H) 07/17/2022  Currently on metformin 500 mg daily Start metformin 1000 mg daily in a week Avoid sugar sweets soda Engage in regular moderate to vigorous exercise at least 150 minutes weekly Diabetic foot exam completed today result was normal Currently sexually ophthalmologist for diabetic eye exam She was encouraged to do self foot exam at home We will check lipid panel at next visit Not on ACE or ARB but her blood pressure is within normal BP Readings from Last 3 Encounters:  07/31/22 107/65  07/19/22 110/70  07/17/22 115/78     BMI 33.0-33.9,adult Wt Readings from Last 3 Encounters:  07/31/22 202 lb 6.4 oz (91.8 kg)  07/18/22 238 lb 1.6 oz (108 kg)  11/23/18 204 lb (92.5 kg)  Patient counseled on low-carb modified diet She was encouraged to engage in regular moderate to vigorous exercises at least 150 minutes weekly      Objective:    Vitals:   07/31/22 0901  BP: 107/65  Pulse: 77  Temp: 98.3 F (36.8 C)  SpO2: 99%  Weight: 202 lb 6.4 oz (91.8 kg)  Height: 5\' 3"  (1.6 m)    Wt Readings from Last 3 Encounters:  07/31/22 202 lb 6.4 oz (91.8 kg)  07/18/22 238 lb 1.6 oz (108 kg)  11/23/18 204 lb (92.5 kg)    Allergies as of 07/31/2022   No Known Allergies      Medication List        Accurate as of July 31, 2022 11:28 AM. If you have any questions, ask your nurse or doctor.          CALCIUM PO Take 1 tablet by mouth daily.   ibuprofen 400 MG tablet Commonly known as: ADVIL Take 800 mg by mouth every 6 (six) hours as needed for mild pain or moderate pain.   metFORMIN 500 MG 24 hr tablet Commonly known as: GLUCOPHAGE-XR Take 2 tablets daily to make 1000mg  daily What changed:  how much to take how to take this when to take this additional instructions Changed by: August 02, 2022, FNP   polyethylene glycol powder 17 GM/SCOOP powder Commonly known as: GLYCOLAX/MIRALAX Take 17 g by mouth daily.         Physical Exam: Constitutional: Patient appears well-developed and well-nourished. Not in obvious distress. HENT: Normocephalic, atraumatic, External right and left ear normal. Oropharynx is clear and moist.  Eyes: Conjunctivae and EOM are normal. PERRLA, no scleral icterus. CVS: RRR, S1/S2 +, no murmurs, no gallops, no carotid bruit.  Pulmonary: Effort and breath sounds normal, no stridor, rhonchi, wheezes, rales.  Abdominal: Soft. BS +, no distension, tenderness, rebound or guarding. Surgical incision healing well, no redness or drainage noted.  Musculoskeletal: Normal range of motion. No edema and no tenderness.  Neuro: Alert. , muscle tone coordination. No cranial nerve deficit. Skin: Skin is warm and dry. No rash noted. Not diaphoretic. No erythema. No pallor. Psychiatric: Normal mood and affect. Behavior, judgment, thought content normal.   Data Review   ----------------------------------------------------------------------------------------     NP on 07/31/2022 at 11:28 AM   **Disclaimer: This note may have been dictated with voice recognition software. Similar sounding words can inadvertently be transcribed and this note may contain transcription errors which may not have been corrected upon publication of note.**

## 2022-07-31 NOTE — Assessment & Plan Note (Signed)
Wt Readings from Last 3 Encounters:  07/31/22 202 lb 6.4 oz (91.8 kg)  07/18/22 238 lb 1.6 oz (108 kg)  11/23/18 204 lb (92.5 kg)  Patient counseled on low-carb modified diet She was encouraged to engage in regular moderate to vigorous exercises at least 150 minutes weekly

## 2022-07-31 NOTE — Assessment & Plan Note (Signed)
Lab Results  Component Value Date   HGBA1C 11.1 (H) 07/17/2022  Currently on metformin 500 mg daily Start metformin 1000 mg daily in a week Avoid sugar sweets soda Engage in regular moderate to vigorous exercise at least 150 minutes weekly Diabetic foot exam completed today result was normal Currently sexually ophthalmologist for diabetic eye exam She was encouraged to do self foot exam at home We will check lipid panel at next visit Not on ACE or ARB but her blood pressure is within normal BP Readings from Last 3 Encounters:  07/31/22 107/65  07/19/22 110/70  07/17/22 115/78

## 2022-07-31 NOTE — Patient Instructions (Addendum)
Please tart taking metformin 1000mg  daily starting next week  Come fasting to your next appointment so we can check your cholesterol levels  Flu vaccine today     It is important that you exercise regularly at least 30 minutes 5 times a week as tolerated  Think about what you will eat, plan ahead. Choose " clean, green, fresh or frozen" over canned, processed or packaged foods which are more sugary, salty and fatty. 70 to 75% of food eaten should be vegetables and fruit. Three meals at set times with snacks allowed between meals, but they must be fruit or vegetables. Aim to eat over a 12 hour period , example 7 am to 7 pm, and STOP after  your last meal of the day. Drink water,generally about 64 ounces per day, no other drink is as healthy. Fruit juice is best enjoyed in a healthy way, by EATING the fruit.  [facility] for choosing [facility] we consider it a privelige to serve you.

## 2022-07-31 NOTE — Assessment & Plan Note (Signed)
Status post lap chloe. Patient currently denies abdominal pain, fever, chills, nausea, vomiting Has upcoming appointment with general surgery, patient encouraged to keep this appointment Incision sites clean and dry no sign of infection noted today today

## 2022-08-28 ENCOUNTER — Ambulatory Visit (INDEPENDENT_AMBULATORY_CARE_PROVIDER_SITE_OTHER): Payer: Self-pay | Admitting: Nurse Practitioner

## 2022-08-28 ENCOUNTER — Encounter: Payer: Self-pay | Admitting: Nurse Practitioner

## 2022-08-28 VITALS — BP 108/67 | HR 80 | Temp 97.7°F | Wt 203.6 lb

## 2022-08-28 DIAGNOSIS — Z1329 Encounter for screening for other suspected endocrine disorder: Secondary | ICD-10-CM

## 2022-08-28 DIAGNOSIS — E119 Type 2 diabetes mellitus without complications: Secondary | ICD-10-CM

## 2022-08-28 DIAGNOSIS — Z0000 Encounter for general adult medical examination without abnormal findings: Secondary | ICD-10-CM | POA: Insufficient documentation

## 2022-08-28 DIAGNOSIS — H579 Unspecified disorder of eye and adnexa: Secondary | ICD-10-CM | POA: Insufficient documentation

## 2022-08-28 DIAGNOSIS — Z1159 Encounter for screening for other viral diseases: Secondary | ICD-10-CM

## 2022-08-28 DIAGNOSIS — Z1321 Encounter for screening for nutritional disorder: Secondary | ICD-10-CM

## 2022-08-28 DIAGNOSIS — Z13220 Encounter for screening for lipoid disorders: Secondary | ICD-10-CM

## 2022-08-28 MED ORDER — METFORMIN HCL ER 500 MG PO TB24: 1000.0000 mg | ORAL_TABLET | Freq: Two times a day (BID) | ORAL | 3 refills | Status: AC

## 2022-08-28 MED ORDER — OLOPATADINE HCL 0.1 % OP SOLN: 1.0000 [drp] | Freq: Two times a day (BID) | OPHTHALMIC | 12 refills | Status: AC

## 2022-08-28 NOTE — Assessment & Plan Note (Signed)
Itchy left eye  start olopatadine 0.1% ophthalmic solution into the affected eye twice daily

## 2022-08-28 NOTE — Patient Instructions (Addendum)
Please start taking metformin 1000mg  twice daily for diabetes.   eyes  - olopatadine (PATANOL) 0.1 % ophthalmic solution; Place 1 drop into the left eye 2 (two) times daily.  Dispense: 5 mL; Refill: 12    It is important that you exercise regularly at least 30 minutes 5 times a week as tolerated  Think about what you will eat, plan ahead. Choose " clean, green, fresh or frozen" over canned, processed or packaged foods which are more sugary, sa lty and fatty. 70 to 75% of food eaten should be vegetables and fruit. Three meals at set times with snacks allowed between meals, but they must be fruit or vegetables. Aim to eat over a 12 hour period , example 7 am to 7 pm, and STOP after  your last meal of the day. Drink water,generally about 64 ounces per day, no other drink is as healthy. Fruit juice is best enjoyed in a healthy way, by EATING the fruit.  Thanks for choosing [facility] we consider it a privelige to serve you.

## 2022-08-28 NOTE — Progress Notes (Signed)
Complete physical exam  Patient: Shawna [name]   DOB: [birth year]   36 y.o. Female  MRN: [id number]  Subjective:    Chief Complaint  Patient presents with   Follow-up    Per pt she has been good    Shawna [name] is a 36 y.o. female with past medical history of type 2 diabetes, cholecystitis status post laparoscopic cholecystectomy, obesity who presents for annual physical examination and to follow-up on type 2 diabetes .  She reports consuming low-carb diet , she does not exercise .  She reports feeling fine generally she has been taking ibuprofen as needed for abdominal pain.  Today she denies abdominal pain, nausea, vomiting, fever, chills, diarrhea.    Type 2 diabetes.  Currently on metformin 1000 mg daily.  Fasting blood sugar readings has been between 10 3-1 56.  States that she has been on a low-carb modified diet but has not been exercising.   Patient complains of itchy left eye.  She denies pain, discharge, changes in her vision, sneezing, stuffiness  She is up-to-date with Tdap vaccine, flu vaccine, Pap exam.   Interpretation p provided via [place] [employer].   Most recent fall risk assessment:    10/08/2018    3:15 PM  [place] in the past year? 0     Most recent depression screenings:    08/28/2022    9:31 AM 07/31/2022    9:00 AM  PHQ 2/9 Scores  PHQ - 2 Score 0 0        Patient Care Team: Renee Rival, FNP as PCP - General (Nurse Practitioner)   Outpatient Medications Prior to Visit  Medication Sig   CALCIUM PO Take 1 tablet by mouth daily.   ibuprofen (ADVIL) 400 MG tablet Take 800 mg by mouth every 6 (six) hours as needed for mild pain or moderate pain.   [DISCONTINUED] metFORMIN (GLUCOPHAGE-XR) 500 MG 24 hr tablet Take 2 tablets daily to make 103m daily   polyethylene glycol powder (GLYCOLAX/MIRALAX) 17 GM/SCOOP powder Take 17 g by mouth daily. (Patient not taking: Reported on 07/31/2022)   No facility-administered  medications prior to visit.    Review of Systems  Constitutional: Negative.  Negative for chills, fever, malaise/fatigue and weight loss.  HENT:  Negative for congestion, ear discharge, ear pain, hearing loss, nosebleeds and tinnitus.        Itchy left eye.  Eyes:  Negative for blurred vision, double vision, photophobia, pain, discharge and redness.  Respiratory: Negative.  Negative for cough, hemoptysis and sputum production.   Cardiovascular: Negative.  Negative for chest pain, palpitations, orthopnea and claudication.  Gastrointestinal: Negative.  Negative for abdominal pain, heartburn, nausea and vomiting.  Genitourinary: Negative.  Negative for dysuria, frequency and urgency.  Musculoskeletal: Negative.  Negative for joint pain, myalgias and neck pain.  Skin: Negative.  Negative for itching and rash.  Neurological: Negative.  Negative for dizziness, tingling, tremors and headaches.  Endo/Heme/Allergies: Negative.  Negative for environmental allergies and polydipsia. Does not bruise/bleed easily.  Psychiatric/Behavioral: Negative.  Negative for depression, hallucinations, substance abuse and suicidal ideas.           Objective:     BP 108/67   Pulse 80   Temp 97.7 F (36.5 C)   Wt 203 lb 9.6 oz (92.4 kg)   SpO2 99%   BMI 36.07 kg/m    Physical Exam Vitals and nursing note reviewed. Exam conducted with a chaperone present.  Constitutional:  General: She is not in acute distress.    Appearance: Normal appearance. She is obese. She is not ill-appearing, toxic-appearing or diaphoretic.  HENT:     Head: Normocephalic and atraumatic.     Right Ear: Tympanic membrane, ear canal and external ear normal. There is no impacted cerumen.     Left Ear: Tympanic membrane, ear canal and external ear normal. There is no impacted cerumen.     Nose: Nose normal. No congestion or rhinorrhea.     Mouth/Throat:     Mouth: Mucous membranes are moist.     Pharynx: Oropharynx is clear.  No oropharyngeal exudate or posterior oropharyngeal erythema.  Eyes:     General: No scleral icterus.       Right eye: No discharge.        Left eye: No discharge.     Extraocular Movements: Extraocular movements intact.     Conjunctiva/sclera: Conjunctivae normal.  Neck:     Vascular: No carotid bruit.  Cardiovascular:     Rate and Rhythm: Normal rate and regular rhythm.     Pulses: Normal pulses.     Heart sounds: Normal heart sounds. No murmur heard.    No friction rub. No gallop.  Pulmonary:     Effort: No respiratory distress.     Breath sounds: Normal breath sounds. No stridor. No wheezing, rhonchi or rales.  Chest:     Chest wall: No mass, lacerations, deformity, swelling, tenderness, crepitus or edema.  Breasts:    Tanner Score is 5.     Breasts are symmetrical.     Right: Normal. No swelling, bleeding, inverted nipple, mass, nipple discharge, skin change or tenderness.     Left: Normal. No swelling, bleeding, inverted nipple, mass, nipple discharge, skin change or tenderness.  Abdominal:     General: There is no distension.     Palpations: Abdomen is soft. There is no mass.     Tenderness: There is no abdominal tenderness. There is no right CVA tenderness, left CVA tenderness, guarding or rebound.     Hernia: No hernia is present.     Comments: Surgical scar healing well, no redness or drainage noted.   Musculoskeletal:        General: No swelling, tenderness, deformity or signs of injury.     Cervical back: Normal range of motion and neck supple. No rigidity or tenderness.     Right lower leg: No edema.     Left lower leg: No edema.  Lymphadenopathy:     Cervical: No cervical adenopathy.     Upper Body:     Right upper body: No supraclavicular, axillary or pectoral adenopathy.     Left upper body: No supraclavicular, axillary or pectoral adenopathy.  Skin:    Capillary Refill: Capillary refill takes less than 2 seconds.     Coloration: Skin is not jaundiced or  pale.     Findings: No bruising, erythema, lesion or rash.  Neurological:     Mental Status: She is alert and oriented to person, place, and time.     Cranial Nerves: No cranial nerve deficit.     Sensory: No sensory deficit.     Motor: No weakness.     Coordination: Coordination normal.     Gait: Gait normal.     Deep Tendon Reflexes: Reflexes normal.  Psychiatric:        Mood and Affect: Mood normal.        Behavior: Behavior normal.  Thought Content: Thought content normal.        Judgment: Judgment normal.      No results found for any visits on 08/28/22.     Assessment & Plan:    Routine Health Maintenance and Physical Exam  Immunization History  Administered Date(s) Administered   Influenza,inj,Quad PF,6+ Mos 06/27/2016, 07/02/2018, 07/31/2022   Tdap 01/05/2014, 06/04/2018    Health Maintenance  Topic Date Due   OPHTHALMOLOGY EXAM  Never done   Diabetic kidney evaluation - Urine ACR  Never done   Hepatitis C Screening  Never done   COVID-19 Vaccine (1) 09/13/2022 (Originally 08/04/1986)   HEMOGLOBIN A1C  01/15/2023   Diabetic kidney evaluation - eGFR measurement  07/19/2023   FOOT EXAM  08/01/2023   PAP SMEAR-Modifier  03/20/2025   DTaP/Tdap/Td (3 - Td or Tdap) 06/04/2028   INFLUENZA VACCINE  Completed   HIV Screening  Completed   HPV VACCINES  Aged Out    Discussed health benefits of physical activity, and encouraged her to engage in regular exercise appropriate for her age and condition.  Problem List Items Addressed This Visit       Endocrine   Diabetes mellitus without complication ([place])    Lab Results  Component Value Date   HGBA1C 11.1 (H) 07/17/2022  Chronic uncontrolled condition Currently on metformin 1000 mg daily.  CBG readings at home has been between 103 and 161 Start metformin 1000 mg twice daily Continue low-carb modified diet Need to engage in regular moderate to vigorous exercise with at least 150 minutes weekly  discussed Checking urine microalbumin , Lipid panel today Referral to ophthalmologist for diabetic eye exam Follow-up in 3 months      Relevant Medications   metFORMIN (GLUCOPHAGE-XR) 500 MG 24 hr tablet   Other Relevant Orders   Microalbumin/Creatinine Ratio, Urine     Other   Annual physical exam - Primary    Annual exam as documented.  Counseling done include healthy lifestyle involving committing to 150 minutes of exercise per week, heart healthy diet, and attaining healthy weight. The importance of adequate sleep also discussed.  Regular use of seat belt and home safety were also discussed .  Immunization and cancer screening  needs are specifically addressed at this visit.   Up-to-date with Pap exam, Tdap, flu vaccine      Relevant Orders   Lipid Panel   TSH   Vitamin D, 25-hydroxy   Hepatitis C antibody   Itchy eyes    Itchy left eye  start olopatadine 0.1% ophthalmic solution into the affected eye twice daily      Relevant Medications   olopatadine (PATANOL) 0.1 % ophthalmic solution   Other Visit Diagnoses     Screening for lipid disorders       Relevant Orders   Lipid Panel   Screening for thyroid disorder       Relevant Orders   TSH   Encounter for vitamin deficiency screening       Relevant Orders   Vitamin D, 25-hydroxy   Need for hepatitis C screening test       Relevant Orders   Hepatitis C antibody      Return in about 3 months (around 11/27/2022) for T2DM.     Renee Rival, FNP

## 2022-08-28 NOTE — Assessment & Plan Note (Addendum)
Annual exam as documented.  Counseling done include healthy lifestyle involving committing to 150 minutes of exercise per week, heart healthy diet, and attaining healthy weight. The importance of adequate sleep also discussed.  Regular use of seat belt and home safety were also discussed .  Immunization and cancer screening  needs are specifically addressed at this visit.   Up-to-date with Pap exam, Tdap, flu vaccine

## 2022-08-28 NOTE — Assessment & Plan Note (Signed)
Lab Results  Component Value Date   HGBA1C 11.1 (H) 07/17/2022  Chronic uncontrolled condition Currently on metformin 1000 mg daily.  CBG readings at home has been between 103 and 161 Start metformin 1000 mg twice daily Continue low-carb modified diet Need to engage in regular moderate to vigorous exercise with at least 150 minutes weekly discussed Checking urine microalbumin , Lipid panel today Referral to ophthalmologist for diabetic eye exam Follow-up in 3 months

## 2022-08-29 LAB — LIPID PANEL
Chol/HDL Ratio: 4.1 ratio (ref 0.0–4.4)
Cholesterol, Total: 126 mg/dL (ref 100–199)
HDL: 31 mg/dL — ABNORMAL LOW (ref >39)
LDL Chol Calc (NIH): 67 mg/dL (ref 0–99)
Triglycerides: 161 mg/dL — ABNORMAL HIGH (ref 0–149)
VLDL Cholesterol Cal: 28 mg/dL (ref 5–40)

## 2022-08-29 LAB — MICROALBUMIN / CREATININE URINE RATIO
Creatinine, Urine: 59 mg/dL
Microalb/Creat Ratio: 21 mg/g creat (ref 0–29)
Microalbumin, Urine: 12.6 ug/mL

## 2022-08-29 LAB — HEPATITIS C ANTIBODY: Hep C Virus Ab: NONREACTIVE

## 2022-08-29 LAB — TSH: TSH: 0.596 u[IU]/mL (ref 0.450–4.500)

## 2022-08-29 LAB — VITAMIN D 25 HYDROXY (VIT D DEFICIENCY, FRACTURES): Vit D, 25-Hydroxy: 27.5 ng/mL — ABNORMAL LOW (ref 30.0–100.0)

## 2022-08-30 ENCOUNTER — Other Ambulatory Visit: Payer: Self-pay | Admitting: Nurse Practitioner

## 2022-08-30 DIAGNOSIS — E559 Vitamin D deficiency, unspecified: Secondary | ICD-10-CM | POA: Insufficient documentation

## 2022-08-30 DIAGNOSIS — E781 Pure hyperglyceridemia: Secondary | ICD-10-CM

## 2022-08-30 NOTE — Progress Notes (Signed)
Hypertriglyceridemia. Take OTC fish oil 1000 mg daily .   Eat a healthy diet, including lots of fruits and vegetables. Avoid foods with a lot of saturated and trans fats, such as red meat, butter, fried foods and cheese . Attain  a healthy weight.  Vitamin D deficiency . Take OTC vitamin D 1000 units daily. Cod liver oil,Salmon,Swordfish,Tuna fish,,Dairy and plant milks fortified with vitamin D,Sardines,Beef liver , get early morning sun.   Other labs are normal.

## 2022-11-27 ENCOUNTER — Ambulatory Visit: Payer: Self-pay | Admitting: Nurse Practitioner

## 2023-01-13 ENCOUNTER — Other Ambulatory Visit: Payer: Self-pay | Admitting: Nurse Practitioner

## 2023-06-06 ENCOUNTER — Emergency Department (HOSPITAL_COMMUNITY): Payer: Self-pay

## 2023-06-06 ENCOUNTER — Encounter (HOSPITAL_COMMUNITY): Payer: Self-pay

## 2023-06-06 ENCOUNTER — Other Ambulatory Visit: Payer: Self-pay

## 2023-06-06 ENCOUNTER — Emergency Department (HOSPITAL_COMMUNITY)
Admission: EM | Admit: 2023-06-06 | Discharge: 2023-06-06 | Disposition: A | Discharge status: Home / Self Care | Payer: Self-pay | Attending: Emergency Medicine | Admitting: Emergency Medicine

## 2023-06-06 DIAGNOSIS — S91209A Unspecified open wound of unspecified toe(s) with damage to nail, initial encounter: Secondary | ICD-10-CM

## 2023-06-06 DIAGNOSIS — E119 Type 2 diabetes mellitus without complications: Secondary | ICD-10-CM | POA: Insufficient documentation

## 2023-06-06 DIAGNOSIS — W208XXA Other cause of strike by thrown, projected or falling object, initial encounter: Secondary | ICD-10-CM | POA: Insufficient documentation

## 2023-06-06 DIAGNOSIS — Z7984 Long term (current) use of oral hypoglycemic drugs: Secondary | ICD-10-CM | POA: Insufficient documentation

## 2023-06-06 DIAGNOSIS — S91219A Laceration without foreign body of unspecified toe(s) with damage to nail, initial encounter: Secondary | ICD-10-CM

## 2023-06-06 DIAGNOSIS — S91112A Laceration without foreign body of left great toe without damage to nail, initial encounter: Secondary | ICD-10-CM | POA: Insufficient documentation

## 2023-06-06 MED ORDER — LIDOCAINE HCL (PF) 1 % IJ SOLN
5.0000 mL | Freq: Once | INTRAMUSCULAR | Status: AC
  Administered 2023-06-06: 5 mL
  Filled 2023-06-06: qty 5

## 2023-06-06 MED ORDER — IBUPROFEN 400 MG PO TABS
600.0000 mg | ORAL_TABLET | Freq: Once | ORAL | Status: AC
  Administered 2023-06-06: 600 mg via ORAL
  Filled 2023-06-06: qty 1

## 2023-06-06 MED ORDER — ACETAMINOPHEN 500 MG PO TABS
1000.0000 mg | ORAL_TABLET | Freq: Once | ORAL | Status: AC
  Administered 2023-06-06: 1000 mg via ORAL
  Filled 2023-06-06: qty 2

## 2023-06-06 NOTE — Discharge Instructions (Signed)
You were seen in the emergency department for your toe injury.  Your x-ray showed no broken bones however your toenail was coming off and needed significant bleeding beneath your toenail.  We fully removed your toenail in the emergency department and placed 3 stitches to repair the cut underneath your toenail.  You can follow-up with podiatry within the next week to have your toe rechecked and for suture removal.  You should keep your dressing in place for the next 24 hours and then you can shower normally, just do not soak your toe under water while the stitches are in place.  You can take Tylenol and Motrin as needed for pain and can ice your foot and keep it elevated to help with any swelling.  You should return to the emergency department sooner if you have pus draining from your toe, spreading redness up your foot or any other new or concerning symptoms.  Lo atendieron en el departamento de emergencias por su lesin en el dedo del pie.  Su radiografa no mostr huesos rotos, sin embargo, la ua del pie se estaba desprendiendo y necesitaba un sangrado significativo debajo de la ua.  Le quitamos completamente la ua del pie en el departamento de emergencias y le colocamos 3 puntos para reparar el corte debajo de la ua.  Puede realizar un seguimiento con el podlogo durante la prxima semana para que le vuelvan a revisar el dedo del pie y le retiren la sutura.  Debe mantener el vendaje en su lugar durante las prximas 24 horas y luego podr ducharse normalmente, pero no sumerja el dedo del pie en agua mientras los puntos estn colocados.  Puede tomar Tylenol y Motrin segn sea necesario para [employer] y puede aplicar hielo en el pie y mantenerlo elevado para ayudar con la hinchazn.  Debe regresar al departamento de emergencias antes si tiene pus drenando del dedo del pie, enrojecimiento extendido por el pie o cualquier otro sntoma nuevo o preocupante.

## 2023-06-06 NOTE — ED Provider Notes (Signed)
[place] EMERGENCY DEPARTMENT AT [facility] Provider Note   CSN: [id number] Arrival date & time: 06/06/23  1146     History  Chief Complaint  Patient presents with   Toe Injury    Shawna [name] is a 37 y.o. female.  Patient is a 37 year old female with medical history of diabetes presenting to the emergency department with toe injury.  The patient was working with a cutting board earlier and accidentally dropped the board on her left first toe.  She states that she has significant pain in that toe as well as her pinky toe on the left foot.  She states that she has had bleeding from her toe since then.  The history is provided by the patient and a relative. No language interpreter was used (Requested son for translation).       Home Medications Prior to Admission medications   Medication Sig Start Date End Date Taking? Authorizing Provider  CALCIUM PO Take 1 tablet by mouth daily.    [provider]  ibuprofen (ADVIL) 400 MG tablet Take 800 mg by mouth every 6 (six) hours as needed for mild pain or moderate pain.    [provider]  metFORMIN (GLUCOPHAGE-XR) 500 MG 24 hr tablet Take 2 tablets (1,000 mg total) by mouth 2 (two) times daily with a meal. 08/28/22   Paseda, Baird Kay, FNP  olopatadine (PATANOL) 0.1 % ophthalmic solution Place 1 drop into the left eye 2 (two) times daily. 08/28/22   Paseda, Baird Kay, FNP  polyethylene glycol powder (GLYCOLAX/MIRALAX) 17 GM/SCOOP powder Take 17 g by mouth daily. Patient not taking: Reported on 07/31/2022 07/19/22   Reece Leader, DO      Allergies    Patient has no known allergies.    Review of Systems   Review of Systems  Physical Exam Updated Vital Signs BP 130/80 (BP Location: Right Arm)   Pulse 77   Temp 98.8 F (37.1 C) (Oral)   Resp 18   Ht 5\' 3"  (1.6 m)   Wt 92.1 kg   SpO2 100%   BMI 35.96 kg/m  Physical Exam Vitals and nursing note reviewed.  Constitutional:      General:  She is not in acute distress.    Appearance: Normal appearance.  HENT:     Head: Normocephalic.     Nose: Nose normal.     Mouth/Throat:     Mouth: Mucous membranes are moist.  Eyes:     Extraocular Movements: Extraocular movements intact.     Conjunctiva/sclera: Conjunctivae normal.  Cardiovascular:     Rate and Rhythm: Normal rate.     Pulses: Normal pulses.  Pulmonary:     Effort: Pulmonary effort is normal.  Abdominal:     General: Abdomen is flat.  Musculoskeletal:        General: Normal range of motion.     Cervical back: Normal range of motion.     Comments: Tenderness to palpation of left first toe and pinky toe, partially avulsed left first toenail with subungual hematoma and oozing around nailbed  Skin:    General: Skin is warm and dry.  Neurological:     General: No focal deficit present.     Mental Status: She is alert and oriented to person, place, and time.  Psychiatric:        Mood and Affect: Mood normal.        Behavior: Behavior normal.     ED Results / Procedures /  Treatments   Labs (all labs ordered are listed, but only abnormal results are displayed) Labs Reviewed - No data to display  EKG None  Radiology DG Foot Complete Left  Result Date: 06/06/2023 CLINICAL DATA:  Left great toe injury after object fell on it. EXAM: LEFT FOOT - COMPLETE 3+ VIEW COMPARISON:  None Available. FINDINGS: There is no evidence of fracture or dislocation. There is no evidence of arthropathy or other focal bone abnormality. Soft tissues are unremarkable. IMPRESSION: Negative. Electronically Signed   By: Lupita Raider M.D.   On: 06/06/2023 13:56    Procedures .Nail Removal  Date/Time: 06/06/2023 3:13 PM  Performed by: Rexford Maus, DO Authorized by: Rexford Maus, DO   Consent:    Consent obtained:  Verbal   Consent given by:  Patient   Risks, benefits, and alternatives were discussed: yes     Risks discussed:  Bleeding, incomplete removal,  infection, pain and permanent nail deformity   Alternatives discussed:  No treatment, delayed treatment and observation Universal protocol:    Procedure explained and questions answered to patient or proxy's satisfaction: yes     Patient identity confirmed:  Verbally with patient Pre-procedure details:    Skin preparation:  Chlorhexidine and povidone-iodine   Preparation: Patient was prepped and draped in the usual sterile fashion   Procedure details:    Location:  Foot   Foot location:  L big toe Anesthesia:    Anesthesia method:  Nerve block   Block location:  Digital block, laterally and medially of toe   Block needle gauge:  27 G   Block anesthetic:  Lidocaine 1% w/o epi   Block technique:  Digital block   Block injection procedure:  Anatomic landmarks identified, anatomic landmarks palpated, introduced needle, negative aspiration for blood and incremental injection   Block outcome:  Anesthesia achieved Nail Removal:    Nail removed:  Complete   Nail bed repaired: yes     Number of sutures:  3   Removed nail replaced and anchored: no   Post-procedure details:    Dressing:  Non-adhesive packing strip   Procedure completion:  Tolerated well, no immediate complications     Medications Ordered in ED Medications  acetaminophen (TYLENOL) tablet 1,000 mg (1,000 mg Oral Given 06/06/23 1304)  ibuprofen (ADVIL) tablet 600 mg (600 mg Oral Given 06/06/23 1304)  lidocaine (PF) (XYLOCAINE) 1 % injection 5 mL (5 mLs Infiltration Given by Other 06/06/23 1456)    ED Course/ Medical Decision Making/ A&P Clinical Course as of 06/06/23 1535  Fri Jun 06, 2023  1426 No acute traumatic injury on foot  [VK]    Clinical Course User Index [VK] Rexford Maus, DO                                 Medical Decision Making This patient presents to the ED with chief complaint(s) of toe injury with pertinent past medical history of DM which further complicates the presenting complaint. The  complaint involves an extensive differential diagnosis and also carries with it a high risk of complications and morbidity.    The differential diagnosis includes fracture, dislocation, sprain, subungual hematoma, toenail avulsion, nailbed laceration   Additional history obtained: Additional history obtained from family Records reviewed N/A  ED Course and Reassessment: On patient's arrival she is hemodynamically stable in no acute distress.  She is initially evaluated by triage and had left  foot x-ray performed.  X-ray showed no acute traumatic injury and no evidence of foreign body.  The patient does have subungual hematoma and a partially avulsed toenail with significant bleeding around the nailbed concerning for nailbed laceration.  She will require toenail removal and laceration repair.  Please see procedure note above.  She declined any hard sole shoe for comfort and was dressed with buddy tape.  Independent labs interpretation:  N/A  Independent visualization of imaging: - I independently visualized the following imaging with scope of interpretation limited to determining acute life threatening conditions related to emergency care: L foot XR, which revealed no acute traumatic injury  Consultation: - Consulted or discussed management/test interpretation w/ external professional: N/A  Consideration for admission or further workup: Patient has no emergent conditions requiring admission or further work-up at this time and is stable for discharge home with primary care follow-up  Social Determinants of health: N/A    Amount and/or Complexity of Data Reviewed Radiology: ordered.  Risk OTC drugs. Prescription drug management.          Final Clinical Impression(s) / ED Diagnoses Final diagnoses:  Avulsion of toenail, initial encounter  Laceration of nail bed of toe, initial encounter    Rx / DC Orders ED Discharge Orders     None         Rexford Maus,  DO 06/06/23 1535

## 2023-06-06 NOTE — ED Notes (Signed)
Suture cart at bedside

## 2023-06-06 NOTE — ED Triage Notes (Signed)
Patient dropped cutting board in left great toe.  Mild bleeding and bruising.

## 2023-12-10 ENCOUNTER — Other Ambulatory Visit (HOSPITAL_COMMUNITY): Payer: Self-pay
